# Patient Record
Sex: Female | Born: 1945 | Race: Black or African American | Hispanic: No | Marital: Single | Smoking: Former smoker
Health system: Southern US, Community
[De-identification: ages and names within clinical notes are randomized; demographics above are authoritative.]

## PROBLEM LIST (undated history)

## (undated) ENCOUNTER — Emergency Department (HOSPITAL_COMMUNITY): Payer: Medicare Other

## (undated) DIAGNOSIS — F32A Depression, unspecified: Secondary | ICD-10-CM

## (undated) DIAGNOSIS — E119 Type 2 diabetes mellitus without complications: Secondary | ICD-10-CM

## (undated) DIAGNOSIS — I10 Essential (primary) hypertension: Secondary | ICD-10-CM

## (undated) DIAGNOSIS — F329 Major depressive disorder, single episode, unspecified: Secondary | ICD-10-CM

## (undated) DIAGNOSIS — E785 Hyperlipidemia, unspecified: Secondary | ICD-10-CM

## (undated) HISTORY — DX: Type 2 diabetes mellitus without complications: E11.9

## (undated) HISTORY — DX: Essential (primary) hypertension: I10

## (undated) HISTORY — PX: UTERINE FIBROID SURGERY: SHX826

## (undated) HISTORY — DX: Major depressive disorder, single episode, unspecified: F32.9

## (undated) HISTORY — DX: Depression, unspecified: F32.A

## (undated) HISTORY — DX: Hyperlipidemia, unspecified: E78.5

---

## 1998-07-01 ENCOUNTER — Encounter: Admission: RE | Admit: 1998-07-01 | Discharge: 1998-09-29 | Payer: Self-pay | Admitting: Unknown Physician Specialty

## 2002-09-10 ENCOUNTER — Encounter: Admission: RE | Admit: 2002-09-10 | Discharge: 2002-12-09 | Payer: Self-pay | Admitting: Family Medicine

## 2008-04-15 ENCOUNTER — Encounter: Admission: RE | Admit: 2008-04-15 | Discharge: 2008-04-15 | Payer: Self-pay | Admitting: Internal Medicine

## 2010-05-05 ENCOUNTER — Encounter: Admission: RE | Admit: 2010-05-05 | Discharge: 2010-05-05 | Payer: Self-pay | Admitting: Family Medicine

## 2011-05-24 ENCOUNTER — Other Ambulatory Visit: Payer: Self-pay | Admitting: Family Medicine

## 2011-05-24 ENCOUNTER — Ambulatory Visit
Admission: RE | Admit: 2011-05-24 | Discharge: 2011-05-24 | Disposition: A | Discharge status: Home / Self Care | Payer: Medicare Other | Source: Ambulatory Visit | Attending: Family Medicine | Admitting: Family Medicine

## 2011-05-24 DIAGNOSIS — Z1239 Encounter for other screening for malignant neoplasm of breast: Secondary | ICD-10-CM

## 2011-07-30 ENCOUNTER — Encounter (INDEPENDENT_AMBULATORY_CARE_PROVIDER_SITE_OTHER): Payer: Medicare Other | Admitting: Ophthalmology

## 2011-08-02 ENCOUNTER — Encounter (INDEPENDENT_AMBULATORY_CARE_PROVIDER_SITE_OTHER): Payer: Medicare HMO | Admitting: Ophthalmology

## 2011-08-02 DIAGNOSIS — E11319 Type 2 diabetes mellitus with unspecified diabetic retinopathy without macular edema: Secondary | ICD-10-CM

## 2011-08-02 DIAGNOSIS — H35039 Hypertensive retinopathy, unspecified eye: Secondary | ICD-10-CM

## 2011-08-02 DIAGNOSIS — H2510 Age-related nuclear cataract, unspecified eye: Secondary | ICD-10-CM

## 2011-08-02 DIAGNOSIS — E1139 Type 2 diabetes mellitus with other diabetic ophthalmic complication: Secondary | ICD-10-CM

## 2011-08-02 DIAGNOSIS — H43819 Vitreous degeneration, unspecified eye: Secondary | ICD-10-CM

## 2011-08-02 DIAGNOSIS — I10 Essential (primary) hypertension: Secondary | ICD-10-CM

## 2012-05-01 ENCOUNTER — Ambulatory Visit (INDEPENDENT_AMBULATORY_CARE_PROVIDER_SITE_OTHER): Payer: Medicare HMO | Admitting: Ophthalmology

## 2012-05-12 ENCOUNTER — Other Ambulatory Visit: Payer: Self-pay | Admitting: Family Medicine

## 2012-05-12 DIAGNOSIS — Z1231 Encounter for screening mammogram for malignant neoplasm of breast: Secondary | ICD-10-CM

## 2012-07-05 ENCOUNTER — Ambulatory Visit: Payer: Medicare HMO

## 2012-10-24 ENCOUNTER — Ambulatory Visit
Admission: RE | Admit: 2012-10-24 | Discharge: 2012-10-24 | Disposition: A | Discharge status: Home / Self Care | Payer: Medicare HMO | Source: Ambulatory Visit | Attending: Family Medicine | Admitting: Family Medicine

## 2012-10-24 DIAGNOSIS — Z1231 Encounter for screening mammogram for malignant neoplasm of breast: Secondary | ICD-10-CM

## 2013-01-30 ENCOUNTER — Other Ambulatory Visit: Payer: Self-pay | Admitting: Family Medicine

## 2013-01-30 ENCOUNTER — Ambulatory Visit
Admission: RE | Admit: 2013-01-30 | Discharge: 2013-01-30 | Disposition: A | Discharge status: Home / Self Care | Payer: Medicare HMO | Source: Ambulatory Visit | Attending: Family Medicine | Admitting: Family Medicine

## 2013-01-30 DIAGNOSIS — M542 Cervicalgia: Secondary | ICD-10-CM

## 2013-02-07 ENCOUNTER — Ambulatory Visit: Payer: Medicare HMO | Attending: Family Medicine

## 2013-02-07 DIAGNOSIS — IMO0001 Reserved for inherently not codable concepts without codable children: Secondary | ICD-10-CM | POA: Insufficient documentation

## 2013-02-07 DIAGNOSIS — R293 Abnormal posture: Secondary | ICD-10-CM | POA: Insufficient documentation

## 2013-02-07 DIAGNOSIS — M542 Cervicalgia: Secondary | ICD-10-CM | POA: Insufficient documentation

## 2013-02-13 ENCOUNTER — Ambulatory Visit: Payer: Medicare HMO | Admitting: Physical Therapy

## 2013-03-02 ENCOUNTER — Encounter: Payer: Medicare HMO | Admitting: Physical Therapy

## 2014-03-18 ENCOUNTER — Encounter: Payer: Medicare Other | Attending: Endocrinology | Admitting: *Deleted

## 2014-03-18 ENCOUNTER — Encounter: Payer: Self-pay | Admitting: *Deleted

## 2014-03-18 VITALS — Ht 67.0 in | Wt 221.2 lb

## 2014-03-18 DIAGNOSIS — Z713 Dietary counseling and surveillance: Secondary | ICD-10-CM | POA: Insufficient documentation

## 2014-03-18 DIAGNOSIS — E119 Type 2 diabetes mellitus without complications: Secondary | ICD-10-CM | POA: Diagnosis present

## 2014-03-18 NOTE — Progress Notes (Signed)
Diabetes Self-Management Education  Visit Type:    Appt. Start Time: 1400 End Time: 1530   03/18/2014  [name], identified by name and date of birth, is a 68 y.o. female with a diagnosis of Diabetes: Type 2.  Other people present during visit:  Patient   ASSESSMENT  Height  (1.702 m), weight 221 lb 3.2 oz (100.336 kg). Body mass index is 34.64 kg/(m^2).  Initial Visit Information:  Are you currently following a meal plan?: No   Are you taking your medications as prescribed?: Yes Are you checking your feet?: Yes How many days per week are you checking your feet?: 7 How often do you need to have someone help you when you read instructions, pamphlets, or other written materials from your doctor or pharmacy?: 1 - Never    Psychosocial:     Patient Belief/Attitude about Diabetes: Motivated to manage diabetes Self-care barriers: None Self-management support: Support group Other persons present: Patient Patient Concerns: Nutrition/Meal planning Special Needs: None Preferred Learning Style: Hands on Learning Readiness: Change in progress  Complications:   Last HgB A1C per patient/outside source: 7.7 mg/dL How often do you check your blood sugar?:  (Patient has not had instruction on her meter yet, and did not bring it today.) Number of hypoglycemic episodes per month: 0 Have you had a dilated eye exam in the past 12 months?: Yes Have you had a dental exam in the past 12 months?: Yes  Diet Intake:  Breakfast: skips most days Snack (morning): none Lunch: breakfast foods such as cereal with 2% milk, and biscuits OR eggs, bacon, biscuits with margarine, coffee with creamora Snack (afternoon): none Dinner: meat, salad, soup, biscuits and extra desserts as available, unsweetened tea Snack (evening): none Beverage(s): coffee, unsweetened tea, water  Exercise:  Exercise: Light (walking / raking leaves) Light Exercise amount of time (min / week):  150  Individualized Plan for Diabetes Self-Management Training:   Learning Objective:  Patient will have a greater understanding of diabetes self-management.  Patient education plan per assessed needs and concerns is to attend individual sessions for     Education Topics Reviewed with Patient Today:  Definition of diabetes, type 1 and 2, and the diagnosis of diabetes Role of diet in the treatment of diabetes and the relationship between the three main macronutrients and blood glucose level;Food label reading, portion sizes and measuring food.;Carbohydrate counting;Information on hints to eating out and maintain blood glucose control. Role of exercise on diabetes management, blood pressure control and cardiac health. Reviewed patients medication for diabetes, action, purpose, timing of dose and side effects. Identified appropriate SMBG and/or A1C goals.;Daily foot exams;Yearly dilated eye exam;Purpose and frequency of SMBG.;Other (comment) (Patient did not bring her meter for instruction. I demonstrated different types while she was here.)     Role of stress on diabetes;Other (comment) (Provided flyer for Support Group)      PATIENTS GOALS/Plan (Developed by the patient):  Nutrition: Follow meal plan discussed Physical Activity: Exercise 3-5 times per week Medications: take my medication as prescribed Monitoring : test blood glucose pre and post meals as discussed Reducing Risk: get labs drawn;do foot checks daily  Plan:   Patient Instructions  Plan:  Aim for 3 Carb Choices per meal (45 grams) +/- 1 either way  Aim for 0-2 Carbs per snack if hungry  Include protein in moderation with your meals and snacks Consider reading food labels for Total Carbohydrate of foods Consider  increasing your activity level by going  to gym again 3 days a week as tolerated Consider checking BG at alternate times per day as directed by MD  Continue taking medication as directed by MD Ask MD for Rx  for Accu Chek Fast Clix Drum lancing device and Drums       Expected Outcomes:  Demonstrated interest in learning. Expect positive outcomes  Education material provided: Living Well with Diabetes, Food label handouts, Meal plan card and Support group flyer  If problems or questions, patient to contact team via:  Phone  Future DSME appointment: 4-6 wks

## 2014-03-18 NOTE — Patient Instructions (Signed)
Plan:  Aim for 3 Carb Choices per meal (45 grams) +/- 1 either way  Aim for 0-2 Carbs per snack if hungry  Include protein in moderation with your meals and snacks Consider reading food labels for Total Carbohydrate of foods Consider  increasing your activity level by going to gym again 3 days a week as tolerated Consider checking BG at alternate times per day as directed by MD  Continue taking medication as directed by MD Ask MD for Rx for Accu Chek Fast Clix Drum lancing device and Drums

## 2014-04-29 ENCOUNTER — Ambulatory Visit: Payer: Medicare HMO | Admitting: *Deleted

## 2014-06-04 ENCOUNTER — Other Ambulatory Visit: Payer: Self-pay

## 2014-06-04 ENCOUNTER — Ambulatory Visit
Admission: RE | Admit: 2014-06-04 | Discharge: 2014-06-04 | Disposition: A | Discharge status: Home / Self Care | Payer: Medicare Other | Source: Ambulatory Visit

## 2014-06-04 DIAGNOSIS — Z1231 Encounter for screening mammogram for malignant neoplasm of breast: Secondary | ICD-10-CM

## 2014-06-06 ENCOUNTER — Other Ambulatory Visit: Payer: Self-pay | Admitting: Family Medicine

## 2014-06-06 DIAGNOSIS — R928 Other abnormal and inconclusive findings on diagnostic imaging of breast: Secondary | ICD-10-CM

## 2014-06-17 ENCOUNTER — Ambulatory Visit
Admission: RE | Admit: 2014-06-17 | Discharge: 2014-06-17 | Disposition: A | Discharge status: Home / Self Care | Payer: Medicare Other | Source: Ambulatory Visit | Attending: Family Medicine | Admitting: Family Medicine

## 2014-06-17 DIAGNOSIS — R928 Other abnormal and inconclusive findings on diagnostic imaging of breast: Secondary | ICD-10-CM

## 2014-09-30 ENCOUNTER — Other Ambulatory Visit (HOSPITAL_COMMUNITY)
Admission: RE | Admit: 2014-09-30 | Discharge: 2014-09-30 | Disposition: A | Discharge status: Home / Self Care | Payer: PPO | Source: Ambulatory Visit | Attending: Family Medicine | Admitting: Family Medicine

## 2014-09-30 ENCOUNTER — Other Ambulatory Visit: Payer: Self-pay | Admitting: Family Medicine

## 2014-09-30 DIAGNOSIS — Z1151 Encounter for screening for human papillomavirus (HPV): Secondary | ICD-10-CM | POA: Insufficient documentation

## 2014-09-30 DIAGNOSIS — Z124 Encounter for screening for malignant neoplasm of cervix: Secondary | ICD-10-CM | POA: Insufficient documentation

## 2014-10-09 LAB — CYTOLOGY - PAP

## 2015-11-17 DIAGNOSIS — M542 Cervicalgia: Secondary | ICD-10-CM | POA: Diagnosis not present

## 2015-11-17 DIAGNOSIS — M545 Low back pain: Secondary | ICD-10-CM | POA: Diagnosis not present

## 2015-12-20 DIAGNOSIS — M542 Cervicalgia: Secondary | ICD-10-CM | POA: Diagnosis not present

## 2015-12-20 DIAGNOSIS — M545 Low back pain: Secondary | ICD-10-CM | POA: Diagnosis not present

## 2015-12-26 DIAGNOSIS — E7800 Pure hypercholesterolemia, unspecified: Secondary | ICD-10-CM | POA: Diagnosis not present

## 2015-12-26 DIAGNOSIS — E1165 Type 2 diabetes mellitus with hyperglycemia: Secondary | ICD-10-CM | POA: Diagnosis not present

## 2015-12-26 DIAGNOSIS — F409 Phobic anxiety disorder, unspecified: Secondary | ICD-10-CM | POA: Diagnosis not present

## 2015-12-26 DIAGNOSIS — Z79899 Other long term (current) drug therapy: Secondary | ICD-10-CM | POA: Diagnosis not present

## 2015-12-26 DIAGNOSIS — I10 Essential (primary) hypertension: Secondary | ICD-10-CM | POA: Diagnosis not present

## 2015-12-26 DIAGNOSIS — G4700 Insomnia, unspecified: Secondary | ICD-10-CM | POA: Diagnosis not present

## 2015-12-26 DIAGNOSIS — Z0001 Encounter for general adult medical examination with abnormal findings: Secondary | ICD-10-CM | POA: Diagnosis not present

## 2015-12-29 DIAGNOSIS — M542 Cervicalgia: Secondary | ICD-10-CM | POA: Diagnosis not present

## 2015-12-29 DIAGNOSIS — M545 Low back pain: Secondary | ICD-10-CM | POA: Diagnosis not present

## 2016-02-13 ENCOUNTER — Ambulatory Visit (INDEPENDENT_AMBULATORY_CARE_PROVIDER_SITE_OTHER): Payer: PPO | Admitting: Internal Medicine

## 2016-02-13 ENCOUNTER — Encounter: Payer: Self-pay | Admitting: Internal Medicine

## 2016-02-13 VITALS — BP 140/82 | HR 79 | Ht 68.5 in | Wt 215.0 lb

## 2016-02-13 DIAGNOSIS — E1165 Type 2 diabetes mellitus with hyperglycemia: Secondary | ICD-10-CM

## 2016-02-13 DIAGNOSIS — Z794 Long term (current) use of insulin: Secondary | ICD-10-CM | POA: Diagnosis not present

## 2016-02-13 DIAGNOSIS — IMO0001 Reserved for inherently not codable concepts without codable children: Secondary | ICD-10-CM

## 2016-02-13 NOTE — Patient Instructions (Signed)
Please continue: - Metformin 1000 mg 1x a day, with your meal - Amaryl 2 mg daily before brunch - Lantus 32 units at bedtime  Start checking sugars 1-2x a day, rotating checktimes.  Please return in 1.5 months with your sugar log.   PATIENT INSTRUCTIONS FOR TYPE 2 DIABETES:  **Please join MyChart!** - see attached instructions about how to join if you have not done so already.  DIET AND EXERCISE Diet and exercise is an important part of diabetic treatment.  We recommended aerobic exercise in the form of brisk walking (working between 40-60% of maximal aerobic capacity, similar to brisk walking) for 150 minutes per week (such as 30 minutes five days per week) along with 3 times per week performing 'resistance' training (using various gauge rubber tubes with handles) 5-10 exercises involving the major muscle groups (upper body, lower body and core) performing 10-15 repetitions (or near fatigue) each exercise. Start at half the above goal but build slowly to reach the above goals. If limited by weight, joint pain, or disability, we recommend daily walking in a swimming pool with water up to waist to reduce pressure from joints while allow for adequate exercise.    BLOOD GLUCOSES Monitoring your blood glucoses is important for continued management of your diabetes. Please check your blood glucoses 2-4 times a day: fasting, before meals and at bedtime (you can rotate these measurements - e.g. one day check before the 3 meals, the next day check before 2 of the meals and before bedtime, etc.).   HYPOGLYCEMIA (low blood sugar) Hypoglycemia is usually a reaction to not eating, exercising, or taking too much insulin/ other diabetes drugs.  Symptoms include tremors, sweating, hunger, confusion, headache, etc. Treat IMMEDIATELY with 15 grams of Carbs: . 4 glucose tablets .  cup regular juice/soda . 2 tablespoons raisins . 4 teaspoons sugar . 1 tablespoon honey Recheck blood glucose in 15 mins and  repeat above if still symptomatic/blood glucose <100.  RECOMMENDATIONS TO REDUCE YOUR RISK OF DIABETIC COMPLICATIONS: * Take your prescribed MEDICATION(S) * Follow a DIABETIC diet: Complex carbs, fiber rich foods, (monounsaturated and polyunsaturated) fats * AVOID saturated/trans fats, high fat foods, >2,300 mg salt per day. * EXERCISE at least 5 times a week for 30 minutes or preferably daily.  * DO NOT SMOKE OR DRINK more than 1 drink a day. * Check your FEET every day. Do not wear tightfitting shoes. Contact us if you develop an ulcer * See your EYE doctor once a year or more if needed * Get a FLU shot once a year * Get a PNEUMONIA vaccine once before and once after age 70 years  GOALS:  * Your Hemoglobin A1c of <7%  * fasting sugars need to be <130 * after meals sugars need to be <180 (2h after you start eating) * Your Systolic BP should be 140 or lower  * Your Diastolic BP should be 80 or lower  * Your HDL (Good Cholesterol) should be 40 or higher  * Your LDL (Bad Cholesterol) should be 100 or lower. * Your Triglycerides should be 150 or lower  * Your Urine microalbumin (kidney function) should be <30 * Your Body Mass Index should be 25 or lower    Please consider the following ways to cut down carbs and fat and increase fiber and micronutrients in your diet: - substitute whole grain for white bread or pasta - substitute brown rice for white rice - substitute 90-calorie flat bread pieces for slices of  bread when possible - substitute sweet potatoes or yams for white potatoes - substitute humus for margarine - substitute tofu for cheese when possible - substitute almond or rice milk for regular milk (would not drink soy milk daily due to concern for soy estrogen influence on breast cancer risk) - substitute dark chocolate for other sweets when possible - substitute water - can add lemon or orange slices for taste - for diet sodas (artificial sweeteners will trick your body that  you can eat sweets without getting calories and will lead you to overeating and weight gain in the long run) - do not skip breakfast or other meals (this will slow down the metabolism and will result in more weight gain over time)  - can try smoothies made from fruit and almond/rice milk in am instead of regular breakfast - can also try old-fashioned (not instant) oatmeal made with almond/rice milk in am - order the dressing on the side when eating salad at a restaurant (pour less than half of the dressing on the salad) - eat as little meat as possible - can try juicing, but should not forget that juicing will get rid of the fiber, so would alternate with eating raw veg./fruits or drinking smoothies - use as little oil as possible, even when using olive oil - can dress a salad with a mix of balsamic vinegar and lemon juice, for e.g. - use agave nectar, stevia sugar, or regular sugar rather than artificial sweateners - steam or broil/roast veggies  - snack on veggies/fruit/nuts (unsalted, preferably) when possible, rather than processed foods - reduce or eliminate aspartame in diet (it is in diet sodas, chewing gum, etc) Read the labels!  Try to read Dr. Janene Harvey book: "Program for Reversing Diabetes" for other ideas for healthy eating.

## 2016-02-13 NOTE — Progress Notes (Signed)
Patient ID: Cynthia Hunt, female   DOB: Apr 12, 1946, 70 y.o.   MRN: [id number]   HPI: Cynthia Hunt is a 70 y.o.-year-old female, referred by her PCP, Dr. Lujean Amel for management of DM2, dx in 32s, insulin-dependent since 2015, uncontrolled, without complications. She was previously seen by Dr Chalmers Cater.  Last hemoglobin A1c was: 12/28/2015: HbA1c 10%  Pt is on a regimen of: - Metformin 1000 mg 1x a day, with her only meal of the day: 2-3 pm - Amaryl 2 mg daily before brunch - Lantus 32 units at bedtime  Pt was checking her sugars 1x a day - not since last year!  (fingers hurt) - am: n/c - 2h after b'fast: n/c - before lunch: n/c - 2h after lunch: n/c - before dinner: n/c - 2h after dinner: n/c - bedtime: 78-80 if no carbohydrates with meals (feels dizzy); 140-150 if eats carbs - nighttime: n/c Lowest sugar was 60s; she has hypoglycemia awareness at 70.  Highest sugar was ?.  Glucometer: AccuChek >> broken  Pt's meals are: - Breakfast: n/c - Lunch: soup, fish, cabage, salad, icecream 5x a week  - Dinner: n/c - Snacks: 0-1 a day - pork rinds, popcorn  - no CKD, last BUN/creatinine:  12/28/2015: 24/1.03, eGFR 53/64 No results found for: BUN, CREATININE - last set of lipids: 12/28/2015: 166/102/51/95 No results found for: CHOL, HDL, LDLCALC, LDLDIRECT, TRIG, CHOLHDL - last eye exam was in 2015. No DR. New exam coming up in 01/2016) - + numbness and tingling in her R foot.  Pt has FH of DM in PGM.  She also has HTN, HL.  ROS: Constitutional: + weight gain, no fatigue, no subjective hyperthermia/hypothermia, + poor sleep, + nocturia Eyes: no blurry vision, no xerophthalmia ENT: no sore throat, no nodules palpated in throat, no dysphagia/odynophagia, no hoarseness, + tinnistus Cardiovascular: no CP/SOB/palpitations/leg swelling Respiratory: no cough/SOB Gastrointestinal: no N/V/D/+ C Musculoskeletal: no muscle/+ joint aches Skin: no rashes Neurological: no  tremors/numbness/tingling/dizziness Psychiatric: + depression/+ anxiety + low libido  Past Medical History:  Diagnosis Date  . Depression   . Diabetes mellitus without complication ([place])   . Hyperlipidemia   . Hypertension    No past surgical history on file. Social History   Social History  . Marital status: Single    Spouse name: N/A  . Number of children: 0   Occupational History  . retired   Social History Main Topics  . Smoking status: Former Smoker    Quit date: 2005  . Smokeless tobacco: Never Used  . Alcohol use Yes     Vodka, rum 3x a year  . Drug use: Unknown    Dose, Frequency               _0   traZODone (DESYREL) 100 MG tablet            Note (02/13/2016): Received from: External Pharmacy        _1   simvastatin (ZOCOR) 40 MG tablet            Note (02/13/2016): Received from: External Pharmacy        _2   methocarbamol (ROBAXIN) 500 MG tablet            Note (02/13/2016): Received from: External Pharmacy        _3   metFORMIN (GLUCOPHAGE) 1000 MG tablet            Note (02/13/2016): Received from: External Pharmacy        _4   losartan (COZAAR) 50 MG tablet            Note (02/13/2016): Received from: External Pharmacy        _0   LANTUS SOLOSTAR 100 UNIT/ML Solostar Pen       Summary: USE 35 UNITS ONCE DAILY AT BEDTIME          Note (02/13/2016): Received from: External Pharmacy        _1   Lancets (FREESTYLE) lancets            Note (02/13/2016): Received from: External Pharmacy        _2   hydrochlorothiazide (HYDRODIURIL) 25 MG tablet            Note (02/13/2016): Received from: External Pharmacy        _3   glimepiride (AMARYL) 2 MG tablet       Summary: TK T PO WITH BREAKFAST OR THE FIRST MAIN MEAL QD          Note (02/13/2016): Received from: External Pharmacy        _4   FREESTYLE LITE test strip            Note (02/13/2016): Received from: External Pharmacy        _5   cetirizine (ZYRTEC) 10 MG tablet       Summary: TK  1 T PO QD          Note (02/13/2016): Received from: External Pharmacy        _6   Blood Glucose Calibration (FREESTYLE CONTROL SOLUTION) LIQD            Note (02/13/2016): Received from: External Pharmacy        _7   ALPRAZolam Duanne Moron) 0.5 MG tablet            Note (02/13/2016): Received from: External Pharmacy        _8   Alcohol Swabs (ALCOHOL PREP) 70 % PADS            Note (02/13/2016): Received from: External Pharmacy          Allergies  Allergen Reactions  . Penicillins    No family history on file.  PE: BP 140/82 (BP Location: Left Arm, Patient Position: Sitting)   Pulse 79   Ht 5' 8.5" (1.74 m)   Wt 215 lb (97.5 kg)   SpO2 95%   BMI 32.22 kg/m  Wt Readings from Last 3 Encounters:  02/13/16 215 lb (97.5 kg)  03/18/14 221 lb 3.2 oz (100.3 kg)   Constitutional: overweight, in NAD Eyes: PERRLA, EOMI, no exophthalmos ENT: moist mucous membranes, no thyromegaly, no cervical lymphadenopathy Cardiovascular: RRR, No MRG Respiratory: CTA B Gastrointestinal: abdomen soft, NT, ND, BS+ Musculoskeletal: no deformities, strength intact in all 4 Skin: moist, warm, no rashes Neurological: no tremor with outstretched hands, DTR normal in all 4  ASSESSMENT: 1. DM2, insulin-dependent, uncontrolled, without complications - ? PN  PLAN:  1. Patient with long-standing, uncontrolled diabetes, on oral antidiabetic regimen + basal insulin, which became insufficient.Unfortunately, she is not checking her sugars, so I cannot adjust her regimen today. She mentions that her fingers hurt when she checks her sugar. We gave her a new FreeStyle Lie meter with alternative site capabilities. We discussed about checking sugars 1 to twice a day, rotating check times. For now, continue the same doses of her medicines but I will see her back in 1.5 months with your sugar log for further adjustment. - I suggested to:  Patient Instructions  Please continue: - Metformin 1000 mg  1x a day, with  your meal - Amaryl 2 mg daily before brunch - Lantus 32 units at bedtime  Start checking sugars 1-2x a day, rotating checktimes.  Please return in 1.5 months with your sugar log.   - Strongly advised her to start checking sugars at different times of the day - check 1-2 times a day, rotating checks - given sugar log and advised how to fill it and to bring it at next appt  - given foot care handout and explained the principles  - given instructions for hypoglycemia management "15-15 rule"  - advised for yearly eye exams  - Return to clinic in 1.5 mo with sugar log   Philemon Kingdom, MD PhD [facility]

## 2016-02-26 DIAGNOSIS — E119 Type 2 diabetes mellitus without complications: Secondary | ICD-10-CM | POA: Diagnosis not present

## 2016-02-26 DIAGNOSIS — H25813 Combined forms of age-related cataract, bilateral: Secondary | ICD-10-CM | POA: Diagnosis not present

## 2016-04-15 ENCOUNTER — Ambulatory Visit: Payer: PPO | Admitting: Internal Medicine

## 2016-07-21 DIAGNOSIS — Z794 Long term (current) use of insulin: Secondary | ICD-10-CM | POA: Diagnosis not present

## 2016-07-21 DIAGNOSIS — E1165 Type 2 diabetes mellitus with hyperglycemia: Secondary | ICD-10-CM | POA: Diagnosis not present

## 2016-07-21 DIAGNOSIS — I10 Essential (primary) hypertension: Secondary | ICD-10-CM | POA: Diagnosis not present

## 2016-07-21 DIAGNOSIS — E7800 Pure hypercholesterolemia, unspecified: Secondary | ICD-10-CM | POA: Diagnosis not present

## 2016-07-21 DIAGNOSIS — Z79899 Other long term (current) drug therapy: Secondary | ICD-10-CM | POA: Diagnosis not present

## 2016-11-12 DIAGNOSIS — E119 Type 2 diabetes mellitus without complications: Secondary | ICD-10-CM | POA: Diagnosis not present

## 2016-12-21 DIAGNOSIS — Z1159 Encounter for screening for other viral diseases: Secondary | ICD-10-CM | POA: Diagnosis not present

## 2016-12-21 DIAGNOSIS — G4700 Insomnia, unspecified: Secondary | ICD-10-CM | POA: Diagnosis not present

## 2016-12-21 DIAGNOSIS — Z794 Long term (current) use of insulin: Secondary | ICD-10-CM | POA: Diagnosis not present

## 2016-12-21 DIAGNOSIS — Z79899 Other long term (current) drug therapy: Secondary | ICD-10-CM | POA: Diagnosis not present

## 2016-12-21 DIAGNOSIS — I10 Essential (primary) hypertension: Secondary | ICD-10-CM | POA: Diagnosis not present

## 2016-12-21 DIAGNOSIS — E1165 Type 2 diabetes mellitus with hyperglycemia: Secondary | ICD-10-CM | POA: Diagnosis not present

## 2016-12-21 DIAGNOSIS — E7800 Pure hypercholesterolemia, unspecified: Secondary | ICD-10-CM | POA: Diagnosis not present

## 2017-02-12 DIAGNOSIS — E119 Type 2 diabetes mellitus without complications: Secondary | ICD-10-CM | POA: Diagnosis not present

## 2017-06-24 DIAGNOSIS — Z79899 Other long term (current) drug therapy: Secondary | ICD-10-CM | POA: Diagnosis not present

## 2017-06-24 DIAGNOSIS — Z7984 Long term (current) use of oral hypoglycemic drugs: Secondary | ICD-10-CM | POA: Diagnosis not present

## 2017-06-24 DIAGNOSIS — E7800 Pure hypercholesterolemia, unspecified: Secondary | ICD-10-CM | POA: Diagnosis not present

## 2017-06-24 DIAGNOSIS — Z0001 Encounter for general adult medical examination with abnormal findings: Secondary | ICD-10-CM | POA: Diagnosis not present

## 2017-06-24 DIAGNOSIS — G4700 Insomnia, unspecified: Secondary | ICD-10-CM | POA: Diagnosis not present

## 2017-06-24 DIAGNOSIS — F409 Phobic anxiety disorder, unspecified: Secondary | ICD-10-CM | POA: Diagnosis not present

## 2017-06-24 DIAGNOSIS — I10 Essential (primary) hypertension: Secondary | ICD-10-CM | POA: Diagnosis not present

## 2017-06-24 DIAGNOSIS — E1165 Type 2 diabetes mellitus with hyperglycemia: Secondary | ICD-10-CM | POA: Diagnosis not present

## 2018-01-04 ENCOUNTER — Other Ambulatory Visit: Payer: Self-pay

## 2018-01-04 NOTE — Patient Outreach (Signed)
[facility]) Care Management  01/04/2018  Orlean BradfordSheila M Rocchi [birth year] [id number]    BS[facility], Livia SnellenGeronda Pulliam, regarding transportation for patient to eye doctor.  Brooke BonitoGeronda was contacted by Enos FlingHalona Mitchell at [facility] regarding this. BSW attempted to contact patient to arrange transportation but was unable to reach her of leave a voicemail message.  BSW will make another attempt tomorrow.   Malachy ChamberAmber Marq Rebello, BSW Social Worker [phone]

## 2018-01-05 ENCOUNTER — Other Ambulatory Visit: Payer: Self-pay

## 2018-01-05 NOTE — Patient Outreach (Signed)
[facility]) Care Management  01/05/2018  Orlean BradfordSheila M Birr [birth year] [id number]  Talked with Ms. Alona BeneMaingot about transportation request to eye dr.  In[facility] if she calls back with appointment details.  Ms. Alona BeneMaingot agreed to call BSW and provide details once scheduled.    Malachy ChamberAmber Laelle Bridgett, BSW Social Worker [phone]

## 2018-01-11 ENCOUNTER — Other Ambulatory Visit: Payer: Self-pay

## 2018-01-11 NOTE — Patient Outreach (Signed)
[facility]) Care Management  01/11/2018  Cynthia BradfordSheila M Cocker 1946/01/04 [id number]  BSW received return call from patient with details for appointment with Dr. Dione BoozeGroat.  Transportation was arranged via [employer] for 01/17/18 @ 1:45 PM.  Malachy ChamberAmber Lorry Furber, BSW Social Worker [phone]

## 2018-09-05 ENCOUNTER — Other Ambulatory Visit: Payer: Self-pay | Admitting: *Deleted

## 2018-09-05 NOTE — Patient Outreach (Signed)
  [facility]) Care Management Chronic Special Needs Program  09/05/2018  Name: Cynthia Hunt DOB: Apr 22, 1946  MRN: [id number]  [name] is enrolled in a chronic special needs plan for  Diabetes. A completed health risk assessment has not been received from the client and client has not responded to outreach attempts by their health care concierge.  The client's individualized care plan was developed based on available data.  Plan:  . Send unsuccessful outreach letter with a copy of individualized care plan to client . Send educational material related to HTN . Send individualized care plan to provider  Chronic care management coordinator, Cynthia Hunt, will attempt outreach in 2-4 months.   Cynthia Richard RN,CCM,CDE [facility] Care Management Coordinator Office Phone [phone] Office Fax [phone]

## 2018-09-06 DIAGNOSIS — E11319 Type 2 diabetes mellitus with unspecified diabetic retinopathy without macular edema: Secondary | ICD-10-CM | POA: Diagnosis not present

## 2018-09-06 DIAGNOSIS — D649 Anemia, unspecified: Secondary | ICD-10-CM | POA: Diagnosis not present

## 2018-09-06 DIAGNOSIS — Z0001 Encounter for general adult medical examination with abnormal findings: Secondary | ICD-10-CM | POA: Diagnosis not present

## 2018-09-06 DIAGNOSIS — I10 Essential (primary) hypertension: Secondary | ICD-10-CM | POA: Diagnosis not present

## 2018-09-06 DIAGNOSIS — E1165 Type 2 diabetes mellitus with hyperglycemia: Secondary | ICD-10-CM | POA: Diagnosis not present

## 2018-09-06 DIAGNOSIS — Z79899 Other long term (current) drug therapy: Secondary | ICD-10-CM | POA: Diagnosis not present

## 2018-09-06 DIAGNOSIS — F409 Phobic anxiety disorder, unspecified: Secondary | ICD-10-CM | POA: Diagnosis not present

## 2018-09-06 DIAGNOSIS — Z794 Long term (current) use of insulin: Secondary | ICD-10-CM | POA: Diagnosis not present

## 2018-09-06 DIAGNOSIS — R42 Dizziness and giddiness: Secondary | ICD-10-CM | POA: Diagnosis not present

## 2018-09-19 ENCOUNTER — Emergency Department (HOSPITAL_COMMUNITY)
Admission: EM | Admit: 2018-09-19 | Discharge: 2018-09-20 | Disposition: A | Discharge status: Home / Self Care | Payer: HMO | Attending: Emergency Medicine | Admitting: Emergency Medicine

## 2018-09-19 ENCOUNTER — Encounter (HOSPITAL_COMMUNITY): Payer: Self-pay | Admitting: *Deleted

## 2018-09-19 ENCOUNTER — Other Ambulatory Visit: Payer: Self-pay

## 2018-09-19 DIAGNOSIS — R55 Syncope and collapse: Secondary | ICD-10-CM | POA: Diagnosis not present

## 2018-09-19 DIAGNOSIS — I959 Hypotension, unspecified: Secondary | ICD-10-CM | POA: Insufficient documentation

## 2018-09-19 DIAGNOSIS — N390 Urinary tract infection, site not specified: Secondary | ICD-10-CM | POA: Diagnosis not present

## 2018-09-19 DIAGNOSIS — E119 Type 2 diabetes mellitus without complications: Secondary | ICD-10-CM | POA: Diagnosis not present

## 2018-09-19 DIAGNOSIS — R0902 Hypoxemia: Secondary | ICD-10-CM | POA: Diagnosis not present

## 2018-09-19 DIAGNOSIS — R42 Dizziness and giddiness: Secondary | ICD-10-CM | POA: Diagnosis not present

## 2018-09-19 DIAGNOSIS — Z79899 Other long term (current) drug therapy: Secondary | ICD-10-CM | POA: Insufficient documentation

## 2018-09-19 DIAGNOSIS — Z87891 Personal history of nicotine dependence: Secondary | ICD-10-CM | POA: Diagnosis not present

## 2018-09-19 DIAGNOSIS — I10 Essential (primary) hypertension: Secondary | ICD-10-CM | POA: Insufficient documentation

## 2018-09-19 DIAGNOSIS — I499 Cardiac arrhythmia, unspecified: Secondary | ICD-10-CM | POA: Diagnosis not present

## 2018-09-19 DIAGNOSIS — I951 Orthostatic hypotension: Secondary | ICD-10-CM

## 2018-09-19 DIAGNOSIS — E1165 Type 2 diabetes mellitus with hyperglycemia: Secondary | ICD-10-CM | POA: Diagnosis not present

## 2018-09-19 LAB — CBC WITH DIFFERENTIAL/PLATELET
Abs Immature Granulocytes: 0.01 10*3/uL (ref 0.00–0.07)
Basophils Absolute: 0 10*3/uL (ref 0.0–0.1)
Basophils Relative: 0 %
Eosinophils Absolute: 0 10*3/uL (ref 0.0–0.5)
Eosinophils Relative: 0 %
HCT: 44.2 % (ref 36.0–46.0)
Hemoglobin: 11.9 g/dL — ABNORMAL LOW (ref 12.0–15.0)
Immature Granulocytes: 0 %
Lymphocytes Relative: 16 %
Lymphs Abs: 1 10*3/uL (ref 0.7–4.0)
MCH: 27.7 pg (ref 26.0–34.0)
MCHC: 26.9 g/dL — ABNORMAL LOW (ref 30.0–36.0)
MCV: 103 fL — ABNORMAL HIGH (ref 80.0–100.0)
Monocytes Absolute: 0.3 10*3/uL (ref 0.1–1.0)
Monocytes Relative: 4 %
Neutro Abs: 4.8 10*3/uL (ref 1.7–7.7)
Neutrophils Relative %: 80 %
Platelets: 186 10*3/uL (ref 150–400)
RBC: 4.29 MIL/uL (ref 3.87–5.11)
RDW: 13.3 % (ref 11.5–15.5)
WBC: 6.1 10*3/uL (ref 4.0–10.5)
nRBC: 0 % (ref 0.0–0.2)

## 2018-09-19 LAB — URINALYSIS, ROUTINE W REFLEX MICROSCOPIC
Bilirubin Urine: NEGATIVE
Glucose, UA: >=500 mg/dL — AB
Ketones, ur: NEGATIVE mg/dL
Nitrite: NEGATIVE
Protein, ur: 30 mg/dL — AB
Specific Gravity, Urine: 1.018 (ref 1.005–1.030)
pH: 5 (ref 5.0–8.0)

## 2018-09-19 LAB — BASIC METABOLIC PANEL
Anion gap: 15 (ref 5–15)
BUN: 54 mg/dL — ABNORMAL HIGH (ref 8–23)
CO2: 21 mmol/L — ABNORMAL LOW (ref 22–32)
Calcium: 9 mg/dL (ref 8.9–10.3)
Chloride: 100 mmol/L (ref 98–111)
Creatinine, Ser: 1.92 mg/dL — ABNORMAL HIGH (ref 0.44–1.00)
GFR calc Af Amer: 30 mL/min — ABNORMAL LOW (ref >60)
GFR calc non Af Amer: 26 mL/min — ABNORMAL LOW (ref >60)
Glucose, Bld: 263 mg/dL — ABNORMAL HIGH (ref 70–99)
Potassium: 3.6 mmol/L (ref 3.5–5.1)
Sodium: 136 mmol/L (ref 135–145)

## 2018-09-19 LAB — CBG MONITORING, ED: Glucose-Capillary: 266 mg/dL — ABNORMAL HIGH (ref 70–99)

## 2018-09-19 MED ORDER — SODIUM CHLORIDE 0.9 % IV BOLUS
1000.0000 mL | Freq: Once | INTRAVENOUS | Status: AC
  Administered 2018-09-19: 1000 mL via INTRAVENOUS

## 2018-09-19 MED ORDER — CIPROFLOXACIN HCL 500 MG PO TABS: 500.0000 mg | ORAL_TABLET | Freq: Two times a day (BID) | ORAL | 0 refills | Status: DC

## 2018-09-19 MED ORDER — CIPROFLOXACIN IN D5W 400 MG/200ML IV SOLN
400.0000 mg | Freq: Once | INTRAVENOUS | Status: AC
  Administered 2018-09-19: 400 mg via INTRAVENOUS
  Filled 2018-09-19: qty 200

## 2018-09-19 NOTE — Discharge Instructions (Addendum)
Do not take your hydrochlorothiazide for 1 week and then resume as previously taking. I want you to follow -up with your PCP at the end of this week or next week.

## 2018-09-19 NOTE — ED Provider Notes (Signed)
[facility]-EMERGENCY DEPT Provider Note   CSN: [id number] Arrival date & time: 09/19/18  1804    History   Chief Complaint Chief Complaint  Patient presents with  . Fall  . Loss of Consciousness    HPI Cynthia Hunt is a 73 y.o. female.     HPI   72yF presenting after fall. Fell in the shower. Says she just wet down. Unsure if she passed out or not. Denies prodrome but says she has felt intermittently dizzy as if she may pass out of the past week or so. Denies any injury from the fall. Orthostatic for EMS. Denies new meds. No fever. No acute neuro complaints.   Past Medical History:  Diagnosis Date  . Depression   . Diabetes mellitus without complication (HCC)   . Hyperlipidemia   . Hypertension     Patient Active Problem List   Diagnosis Date Noted  . Uncontrolled type 2 diabetes mellitus without complication, with long-term current use of insulin (HCC) 02/13/2016    History reviewed. No pertinent surgical history.   OB History   No obstetric history on file.      Home Medications    Prior to Admission medications   Medication Sig Start Date End Date Taking? Authorizing Provider  ALPRAZolam Prudy Feeler) 0.5 MG tablet Take 0.5 mg by mouth daily as needed for anxiety.  12/26/15  Yes [provider]  diphenhydrAMINE (BENADRYL) 25 MG tablet Take 25 mg by mouth daily as needed for allergies.   Yes [provider]  LANTUS SOLOSTAR 100 UNIT/ML Solostar Pen Inject 30 Units into the skin at bedtime.  01/27/16  Yes [provider]  losartan (COZAAR) 50 MG tablet Take 50 mg by mouth daily.  01/30/16  Yes [provider]  metFORMIN (GLUCOPHAGE) 1000 MG tablet Take 1,000 mg by mouth daily.  01/12/16  Yes [provider]  methocarbamol (ROBAXIN) 500 MG tablet Take 500 mg by mouth daily.  02/02/16  Yes [provider]  simvastatin (ZOCOR) 40 MG tablet Take 40 mg by mouth daily.  01/27/16  Yes [provider]  traZODone (DESYREL) 100 MG tablet Take 100 mg by mouth at bedtime.  01/03/16  Yes [provider]  Alcohol Swabs (ALCOHOL PREP) 70 % PADS  02/11/16   [provider]  Blood Glucose Calibration (FREESTYLE CONTROL SOLUTION) LIQD  02/11/16   [provider]  FREESTYLE LITE test strip  02/11/16   [provider]  hydrochlorothiazide (HYDRODIURIL) 25 MG tablet Take 25 mg by mouth daily.  02/12/16   [provider]  Lancets (FREESTYLE) lancets  02/11/16   [provider]    Family History No family history on file.  Social History Social History   Tobacco Use  . Smoking status: Former Smoker    Last attempt to quit: 03/18/2006    Years since quitting: 12.5  . Smokeless tobacco: Never Used  Substance Use Topics  . Alcohol use: Yes    Comment: twice a year  . Drug use: Not on file     Allergies   Penicillins   Review of Systems Review of Systems  All systems reviewed and negative, other than as noted in HPI.  Physical Exam Updated Vital Signs BP 114/65   Pulse 94   Temp 98.6 F (37 C) (Oral)   Resp 16   SpO2 91%   Physical Exam Vitals signs and nursing note reviewed.  Constitutional:      General: She  is not in acute distress.    Appearance: She is well-developed.  HENT:     Head: Normocephalic and atraumatic.  Eyes:     General:        Right eye: No discharge.        Left eye: No discharge.     Conjunctiva/sclera: Conjunctivae normal.  Neck:     Musculoskeletal: Neck supple.  Cardiovascular:     Rate and Rhythm: Normal rate and regular rhythm.     Heart sounds: Normal heart sounds. No murmur. No friction rub. No gallop.   Pulmonary:     Effort: Pulmonary effort is normal. No respiratory distress.     Breath sounds: Normal breath sounds.  Abdominal:     General: There is no distension.     Palpations: Abdomen is soft.     Tenderness: There is no abdominal tenderness.  Musculoskeletal:         General: No tenderness.  Skin:    General: Skin is warm and dry.  Neurological:     Mental Status: She is alert.  Psychiatric:        Behavior: Behavior normal.        Thought Content: Thought content normal.      ED Treatments / Results  Labs (all labs ordered are listed, but only abnormal results are displayed) Labs Reviewed  CBC WITH DIFFERENTIAL/PLATELET - Abnormal; Notable for the following components:      Result Value   Hemoglobin 11.9 (*)    MCV 103.0 (*)    MCHC 26.9 (*)    All other components within normal limits  BASIC METABOLIC PANEL - Abnormal; Notable for the following components:   CO2 21 (*)    Glucose, Bld 263 (*)    BUN 54 (*)    Creatinine, Ser 1.92 (*)    GFR calc non Af Amer 26 (*)    GFR calc Af Amer 30 (*)    All other components within normal limits  URINALYSIS, ROUTINE W REFLEX MICROSCOPIC - Abnormal; Notable for the following components:   APPearance HAZY (*)    Glucose, UA >=500 (*)    Hgb urine dipstick SMALL (*)    Protein, ur 30 (*)    Leukocytes,Ua TRACE (*)    Bacteria, UA MANY (*)    All other components within normal limits  CBG MONITORING, ED - Abnormal; Notable for the following components:   Glucose-Capillary 266 (*)    All other components within normal limits  URINE CULTURE    EKG EKG Interpretation  Date/Time:  Tuesday September 19 2018 18:29:02 EDT Ventricular Rate:  81 PR Interval:    QRS Duration: 104 QT Interval:  375 QTC Calculation: 436 R Axis:   34 Text Interpretation:  Unknown rhythm, irregular rate Probable inferior infarct, age indeterminate Confirmed by Raeford Razor [phone]) on 09/19/2018 6:33:56 PM   Radiology No results found.  Procedures Procedures (including critical care time)  Medications Ordered in ED Medications  ciprofloxacin (CIPRO) IVPB 400 mg (has no administration in time range)  sodium chloride 0.9 % bolus 1,000 mL (1,000 mLs Intravenous New Bag/Given 09/19/18 1948)     Initial  Impression / Assessment and Plan / ED Course  I have reviewed the triage vital signs and the nursing notes.  Pertinent labs & imaging results that were available during my care of the patient were reviewed by me and considered in my medical decision making (see chart for details).  72yF with falls. Very orthostatic. Renal function noted. She was given IVF in the ED> Advised to hold HCTZ for 1 week. Abx for UTI. She has ambulated here in the ED w/o complaint. At this point I think she is appropriate for DC. CLose outpt FU for re-eval and repeat BMP.   Final Clinical Impressions(s) / ED Diagnoses   Final diagnoses:  Orthostatic hypotension  Urinary tract infection without hematuria, site unspecified    ED Discharge Orders    None       Raeford Razor, MD 09/20/18 1919

## 2018-09-19 NOTE — ED Notes (Signed)
Bed: HY86 Expected date:  Expected time:  Means of arrival:  Comments: 70sF fall, positive orthostatics

## 2018-09-19 NOTE — ED Notes (Signed)
Walked pt to bathroom/ plus 2 ( no problem)

## 2018-09-19 NOTE — ED Triage Notes (Signed)
Pt transported from home by EMS. Pt fell in shower. EMS witnessed a single syncopal episode and 2 near syncopal episodes while attempting orthostatics, but pt denies syncope. Denies SOB and N/V. Pt denies hitting head in fall, but complains of upper back pain from position stuck in shower for 30 min. Pt persistent that "she is fine", but EMS states she is unstable. Pt complains of dizziness x 2 days.   HR 84 R 18  99% on RA  CBG 286    Standing 70/40 Sitting 131/67   22 gauge in left hand

## 2018-09-23 LAB — URINE CULTURE: Culture: >=100000 — AB

## 2018-09-24 ENCOUNTER — Telehealth: Payer: Self-pay | Admitting: Emergency Medicine

## 2018-09-26 DIAGNOSIS — I951 Orthostatic hypotension: Secondary | ICD-10-CM | POA: Diagnosis not present

## 2018-09-26 DIAGNOSIS — I10 Essential (primary) hypertension: Secondary | ICD-10-CM | POA: Diagnosis not present

## 2018-09-26 DIAGNOSIS — R5381 Other malaise: Secondary | ICD-10-CM | POA: Diagnosis not present

## 2018-09-26 DIAGNOSIS — R55 Syncope and collapse: Secondary | ICD-10-CM | POA: Diagnosis not present

## 2018-09-26 DIAGNOSIS — Z79899 Other long term (current) drug therapy: Secondary | ICD-10-CM | POA: Diagnosis not present

## 2018-09-26 DIAGNOSIS — E1165 Type 2 diabetes mellitus with hyperglycemia: Secondary | ICD-10-CM | POA: Diagnosis not present

## 2018-09-26 DIAGNOSIS — Z794 Long term (current) use of insulin: Secondary | ICD-10-CM | POA: Diagnosis not present

## 2018-10-22 ENCOUNTER — Inpatient Hospital Stay (HOSPITAL_COMMUNITY): Payer: HMO

## 2018-10-22 ENCOUNTER — Emergency Department (HOSPITAL_COMMUNITY): Payer: HMO

## 2018-10-22 ENCOUNTER — Inpatient Hospital Stay (HOSPITAL_COMMUNITY)
Admission: EM | Admit: 2018-10-22 | Discharge: 2018-10-25 | DRG: 065 | Disposition: A | Discharge status: Rehabilitation Facility | Payer: HMO | Attending: Internal Medicine | Admitting: Internal Medicine

## 2018-10-22 ENCOUNTER — Encounter (HOSPITAL_COMMUNITY): Payer: Self-pay

## 2018-10-22 ENCOUNTER — Other Ambulatory Visit: Payer: Self-pay

## 2018-10-22 ENCOUNTER — Other Ambulatory Visit (HOSPITAL_COMMUNITY): Payer: HMO

## 2018-10-22 DIAGNOSIS — Z880 Allergy status to penicillin: Secondary | ICD-10-CM | POA: Diagnosis not present

## 2018-10-22 DIAGNOSIS — I639 Cerebral infarction, unspecified: Secondary | ICD-10-CM

## 2018-10-22 DIAGNOSIS — F329 Major depressive disorder, single episode, unspecified: Secondary | ICD-10-CM | POA: Diagnosis not present

## 2018-10-22 DIAGNOSIS — R0989 Other specified symptoms and signs involving the circulatory and respiratory systems: Secondary | ICD-10-CM | POA: Diagnosis not present

## 2018-10-22 DIAGNOSIS — I63411 Cerebral infarction due to embolism of right middle cerebral artery: Principal | ICD-10-CM | POA: Diagnosis present

## 2018-10-22 DIAGNOSIS — F419 Anxiety disorder, unspecified: Secondary | ICD-10-CM | POA: Diagnosis not present

## 2018-10-22 DIAGNOSIS — E785 Hyperlipidemia, unspecified: Secondary | ICD-10-CM | POA: Diagnosis present

## 2018-10-22 DIAGNOSIS — E1122 Type 2 diabetes mellitus with diabetic chronic kidney disease: Secondary | ICD-10-CM | POA: Diagnosis not present

## 2018-10-22 DIAGNOSIS — Z6832 Body mass index (BMI) 32.0-32.9, adult: Secondary | ICD-10-CM | POA: Diagnosis not present

## 2018-10-22 DIAGNOSIS — I69354 Hemiplegia and hemiparesis following cerebral infarction affecting left non-dominant side: Secondary | ICD-10-CM | POA: Diagnosis not present

## 2018-10-22 DIAGNOSIS — Z794 Long term (current) use of insulin: Secondary | ICD-10-CM

## 2018-10-22 DIAGNOSIS — Z87891 Personal history of nicotine dependence: Secondary | ICD-10-CM

## 2018-10-22 DIAGNOSIS — G8194 Hemiplegia, unspecified affecting left nondominant side: Secondary | ICD-10-CM | POA: Diagnosis not present

## 2018-10-22 DIAGNOSIS — I160 Hypertensive urgency: Secondary | ICD-10-CM | POA: Diagnosis present

## 2018-10-22 DIAGNOSIS — Z7984 Long term (current) use of oral hypoglycemic drugs: Secondary | ICD-10-CM | POA: Diagnosis not present

## 2018-10-22 DIAGNOSIS — R29810 Facial weakness: Secondary | ICD-10-CM | POA: Diagnosis not present

## 2018-10-22 DIAGNOSIS — E6609 Other obesity due to excess calories: Secondary | ICD-10-CM | POA: Diagnosis not present

## 2018-10-22 DIAGNOSIS — R471 Dysarthria and anarthria: Secondary | ICD-10-CM | POA: Diagnosis not present

## 2018-10-22 DIAGNOSIS — E1165 Type 2 diabetes mellitus with hyperglycemia: Secondary | ICD-10-CM | POA: Diagnosis not present

## 2018-10-22 DIAGNOSIS — I69322 Dysarthria following cerebral infarction: Secondary | ICD-10-CM | POA: Diagnosis not present

## 2018-10-22 DIAGNOSIS — N183 Chronic kidney disease, stage 3 (moderate): Secondary | ICD-10-CM | POA: Diagnosis not present

## 2018-10-22 DIAGNOSIS — I351 Nonrheumatic aortic (valve) insufficiency: Secondary | ICD-10-CM | POA: Diagnosis not present

## 2018-10-22 DIAGNOSIS — Z7951 Long term (current) use of inhaled steroids: Secondary | ICD-10-CM

## 2018-10-22 DIAGNOSIS — D62 Acute posthemorrhagic anemia: Secondary | ICD-10-CM | POA: Diagnosis not present

## 2018-10-22 DIAGNOSIS — I10 Essential (primary) hypertension: Secondary | ICD-10-CM | POA: Diagnosis not present

## 2018-10-22 DIAGNOSIS — I6319 Cerebral infarction due to embolism of other precerebral artery: Secondary | ICD-10-CM

## 2018-10-22 DIAGNOSIS — R931 Abnormal findings on diagnostic imaging of heart and coronary circulation: Secondary | ICD-10-CM | POA: Diagnosis not present

## 2018-10-22 DIAGNOSIS — E1142 Type 2 diabetes mellitus with diabetic polyneuropathy: Secondary | ICD-10-CM | POA: Diagnosis not present

## 2018-10-22 DIAGNOSIS — Z79899 Other long term (current) drug therapy: Secondary | ICD-10-CM | POA: Diagnosis not present

## 2018-10-22 DIAGNOSIS — Z823 Family history of stroke: Secondary | ICD-10-CM

## 2018-10-22 DIAGNOSIS — R000 Tachycardia, unspecified: Secondary | ICD-10-CM | POA: Diagnosis not present

## 2018-10-22 DIAGNOSIS — E669 Obesity, unspecified: Secondary | ICD-10-CM | POA: Diagnosis not present

## 2018-10-22 DIAGNOSIS — E876 Hypokalemia: Secondary | ICD-10-CM | POA: Diagnosis not present

## 2018-10-22 DIAGNOSIS — I129 Hypertensive chronic kidney disease with stage 1 through stage 4 chronic kidney disease, or unspecified chronic kidney disease: Secondary | ICD-10-CM | POA: Diagnosis not present

## 2018-10-22 DIAGNOSIS — I69392 Facial weakness following cerebral infarction: Secondary | ICD-10-CM | POA: Diagnosis not present

## 2018-10-22 DIAGNOSIS — I6522 Occlusion and stenosis of left carotid artery: Secondary | ICD-10-CM | POA: Diagnosis not present

## 2018-10-22 DIAGNOSIS — G8190 Hemiplegia, unspecified affecting unspecified side: Secondary | ICD-10-CM | POA: Diagnosis not present

## 2018-10-22 DIAGNOSIS — I63511 Cerebral infarction due to unspecified occlusion or stenosis of right middle cerebral artery: Secondary | ICD-10-CM | POA: Diagnosis not present

## 2018-10-22 DIAGNOSIS — E1151 Type 2 diabetes mellitus with diabetic peripheral angiopathy without gangrene: Secondary | ICD-10-CM | POA: Diagnosis not present

## 2018-10-22 DIAGNOSIS — IMO0001 Reserved for inherently not codable concepts without codable children: Secondary | ICD-10-CM

## 2018-10-22 DIAGNOSIS — I169 Hypertensive crisis, unspecified: Secondary | ICD-10-CM | POA: Diagnosis not present

## 2018-10-22 DIAGNOSIS — R7309 Other abnormal glucose: Secondary | ICD-10-CM | POA: Diagnosis not present

## 2018-10-22 DIAGNOSIS — R51 Headache: Secondary | ICD-10-CM | POA: Diagnosis not present

## 2018-10-22 DIAGNOSIS — R531 Weakness: Secondary | ICD-10-CM | POA: Diagnosis not present

## 2018-10-22 DIAGNOSIS — K5901 Slow transit constipation: Secondary | ICD-10-CM | POA: Diagnosis not present

## 2018-10-22 LAB — COMPREHENSIVE METABOLIC PANEL
ALT: 20 U/L (ref 0–44)
AST: 22 U/L (ref 15–41)
Albumin: 3.5 g/dL (ref 3.5–5.0)
Alkaline Phosphatase: 49 U/L (ref 38–126)
Anion gap: 18 — ABNORMAL HIGH (ref 5–15)
BUN: 18 mg/dL (ref 8–23)
CO2: 22 mmol/L (ref 22–32)
Calcium: 8.2 mg/dL — ABNORMAL LOW (ref 8.9–10.3)
Chloride: 100 mmol/L (ref 98–111)
Creatinine, Ser: 1.34 mg/dL — ABNORMAL HIGH (ref 0.44–1.00)
GFR calc Af Amer: 46 mL/min — ABNORMAL LOW (ref >60)
GFR calc non Af Amer: 39 mL/min — ABNORMAL LOW (ref >60)
Glucose, Bld: 175 mg/dL — ABNORMAL HIGH (ref 70–99)
Potassium: 3.4 mmol/L — ABNORMAL LOW (ref 3.5–5.1)
Sodium: 140 mmol/L (ref 135–145)
Total Bilirubin: 0.8 mg/dL (ref 0.3–1.2)
Total Protein: 7.7 g/dL (ref 6.5–8.1)

## 2018-10-22 LAB — HEMOGLOBIN A1C
Hgb A1c MFr Bld: 9.2 % — ABNORMAL HIGH (ref 4.8–5.6)
Mean Plasma Glucose: 217.34 mg/dL

## 2018-10-22 LAB — DIFFERENTIAL
Abs Immature Granulocytes: 0.02 10*3/uL (ref 0.00–0.07)
Basophils Absolute: 0 10*3/uL (ref 0.0–0.1)
Basophils Relative: 0 %
Eosinophils Absolute: 0 10*3/uL (ref 0.0–0.5)
Eosinophils Relative: 0 %
Immature Granulocytes: 0 %
Lymphocytes Relative: 12 %
Lymphs Abs: 0.9 10*3/uL (ref 0.7–4.0)
Monocytes Absolute: 0.3 10*3/uL (ref 0.1–1.0)
Monocytes Relative: 4 %
Neutro Abs: 6.1 10*3/uL (ref 1.7–7.7)
Neutrophils Relative %: 84 %

## 2018-10-22 LAB — CBC
HCT: 35 % — ABNORMAL LOW (ref 36.0–46.0)
Hemoglobin: 10.5 g/dL — ABNORMAL LOW (ref 12.0–15.0)
MCH: 28.3 pg (ref 26.0–34.0)
MCHC: 30 g/dL (ref 30.0–36.0)
MCV: 94.3 fL (ref 80.0–100.0)
Platelets: 238 10*3/uL (ref 150–400)
RBC: 3.71 MIL/uL — ABNORMAL LOW (ref 3.87–5.11)
RDW: 13.6 % (ref 11.5–15.5)
WBC: 7.4 10*3/uL (ref 4.0–10.5)
nRBC: 0 % (ref 0.0–0.2)

## 2018-10-22 LAB — PROTIME-INR
INR: 1.1 (ref 0.8–1.2)
Prothrombin Time: 14.1 seconds (ref 11.4–15.2)

## 2018-10-22 LAB — ECHOCARDIOGRAM LIMITED
Height: 67 in
Weight: 3280 oz

## 2018-10-22 LAB — CK: Total CK: 69 U/L (ref 38–234)

## 2018-10-22 LAB — APTT: aPTT: 29 seconds (ref 24–36)

## 2018-10-22 LAB — ETHANOL: Alcohol, Ethyl (B): <10 mg/dL (ref <10)

## 2018-10-22 LAB — I-STAT CREATININE, ED: Creatinine, Ser: 1.1 mg/dL — ABNORMAL HIGH (ref 0.44–1.00)

## 2018-10-22 LAB — TSH: TSH: 1.061 u[IU]/mL (ref 0.350–4.500)

## 2018-10-22 MED ORDER — GADOBUTROL 1 MMOL/ML IV SOLN
10.0000 mL | Freq: Once | INTRAVENOUS | Status: AC | PRN
  Administered 2018-10-22: 10 mL via INTRAVENOUS

## 2018-10-22 MED ORDER — INSULIN GLARGINE 100 UNIT/ML ~~LOC~~ SOLN
30.0000 [IU] | Freq: Every day | SUBCUTANEOUS | Status: DC
  Administered 2018-10-22 – 2018-10-24 (×3): 30 [IU] via SUBCUTANEOUS
  Filled 2018-10-22 (×4): qty 0.3

## 2018-10-22 MED ORDER — LORAZEPAM 2 MG/ML IJ SOLN: 0.5000 mg | Freq: Once | INTRAMUSCULAR | Status: DC

## 2018-10-22 MED ORDER — ACETAMINOPHEN 325 MG PO TABS: 650.0000 mg | ORAL_TABLET | ORAL | Status: DC | PRN

## 2018-10-22 MED ORDER — METHOCARBAMOL 500 MG PO TABS
500.0000 mg | ORAL_TABLET | Freq: Every day | ORAL | Status: DC
  Administered 2018-10-22 – 2018-10-25 (×4): 500 mg via ORAL
  Filled 2018-10-22 (×4): qty 1

## 2018-10-22 MED ORDER — SODIUM CHLORIDE 0.9 % IV BOLUS
1000.0000 mL | Freq: Once | INTRAVENOUS | Status: AC
  Administered 2018-10-22: 1000 mL via INTRAVENOUS

## 2018-10-22 MED ORDER — TRAZODONE HCL 100 MG PO TABS
100.0000 mg | ORAL_TABLET | Freq: Every day | ORAL | Status: DC
  Administered 2018-10-22 – 2018-10-24 (×3): 100 mg via ORAL
  Filled 2018-10-22: qty 1
  Filled 2018-10-22 (×2): qty 2

## 2018-10-22 MED ORDER — ACETAMINOPHEN 650 MG RE SUPP: 650.0000 mg | RECTAL | Status: DC | PRN

## 2018-10-22 MED ORDER — STROKE: EARLY STAGES OF RECOVERY BOOK: Freq: Once | Status: DC

## 2018-10-22 MED ORDER — ASPIRIN 300 MG RE SUPP: 300.0000 mg | Freq: Every day | RECTAL | Status: DC

## 2018-10-22 MED ORDER — INSULIN ASPART 100 UNIT/ML ~~LOC~~ SOLN
0.0000 [IU] | Freq: Every day | SUBCUTANEOUS | Status: DC
  Administered 2018-10-22: 23:00:00 2 [IU] via SUBCUTANEOUS

## 2018-10-22 MED ORDER — ALPRAZOLAM 0.5 MG PO TABS: 0.5000 mg | ORAL_TABLET | Freq: Every day | ORAL | Status: DC | PRN

## 2018-10-22 MED ORDER — ACETAMINOPHEN 160 MG/5ML PO SOLN: 650.0000 mg | ORAL | Status: DC | PRN

## 2018-10-22 MED ORDER — SODIUM CHLORIDE 0.9 % IV SOLN
INTRAVENOUS | Status: DC
  Administered 2018-10-22: 11:00:00 via INTRAVENOUS

## 2018-10-22 MED ORDER — ASPIRIN 325 MG PO TABS
325.0000 mg | ORAL_TABLET | Freq: Every day | ORAL | Status: DC
  Administered 2018-10-22 – 2018-10-23 (×2): 325 mg via ORAL
  Filled 2018-10-22 (×2): qty 1

## 2018-10-22 MED ORDER — ATORVASTATIN CALCIUM 40 MG PO TABS
40.0000 mg | ORAL_TABLET | Freq: Every day | ORAL | Status: DC
  Administered 2018-10-22 – 2018-10-24 (×3): 40 mg via ORAL
  Filled 2018-10-22 (×3): qty 1

## 2018-10-22 MED ORDER — ASPIRIN 325 MG PO TABS
325.0000 mg | ORAL_TABLET | Freq: Every day | ORAL | Status: DC
  Administered 2018-10-22: 325 mg via ORAL

## 2018-10-22 MED ORDER — SENNOSIDES-DOCUSATE SODIUM 8.6-50 MG PO TABS: 1.0000 | ORAL_TABLET | Freq: Every evening | ORAL | Status: DC | PRN

## 2018-10-22 MED ORDER — ENOXAPARIN SODIUM 40 MG/0.4ML ~~LOC~~ SOLN
40.0000 mg | SUBCUTANEOUS | Status: DC
  Administered 2018-10-22 – 2018-10-25 (×3): 40 mg via SUBCUTANEOUS
  Filled 2018-10-22 (×3): qty 0.4

## 2018-10-22 MED ORDER — STROKE: EARLY STAGES OF RECOVERY BOOK
Freq: Once | Status: AC
  Administered 2018-10-22: 11:00:00

## 2018-10-22 MED ORDER — INSULIN ASPART 100 UNIT/ML ~~LOC~~ SOLN
0.0000 [IU] | Freq: Three times a day (TID) | SUBCUTANEOUS | Status: DC
  Administered 2018-10-23: 8 [IU] via SUBCUTANEOUS
  Administered 2018-10-23: 07:00:00 2 [IU] via SUBCUTANEOUS
  Administered 2018-10-23: 12:00:00 8 [IU] via SUBCUTANEOUS
  Administered 2018-10-25: 3 [IU] via SUBCUTANEOUS

## 2018-10-22 NOTE — Progress Notes (Signed)
Carotid duplex has been completed.   Preliminary results in CV Proc.   Blanch Media 10/22/2018 11:48 AM

## 2018-10-22 NOTE — ED Notes (Signed)
Patient transported to CT 

## 2018-10-22 NOTE — Procedures (Signed)
Second attempt at echo made. Patient out of room at another test. Will attempt again.
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
## 2018-10-22 NOTE — ED Notes (Signed)
Social Worker at pt bedside.
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
## 2018-10-22 NOTE — ED Notes (Signed)
Attempted report x1; left name and number with Diplomatic Services operational officer for RN to call back.

## 2018-10-22 NOTE — Progress Notes (Signed)
  Echocardiogram 2D Echocardiogram has been performed.  Cynthia Hunt 10/22/2018, 3:27 PM
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
## 2018-10-22 NOTE — Progress Notes (Signed)
PT Cancellation Note  Patient Details Name: Cynthia Hunt MRN: [id number] DOB: 1945/10/04   Cancelled Treatment:    Reason Eval/Treat Not Completed: Patient at procedure or test/unavailable. Pt off the floor at MRI. PT to return as able to complete PT eval.  Lewis Shock, PT, DPT Acute Rehabilitation Services Pager #: [phone] Office #: [phone]    Iona Hansen 10/22/2018, 12:49 PM

## 2018-10-22 NOTE — Procedures (Signed)
Echo attempted. Nurse stated patient was waiting on arrival of bedside toilet. Will attempt again.

## 2018-10-22 NOTE — ED Notes (Signed)
Pt returned to room from CT

## 2018-10-22 NOTE — ED Notes (Signed)
ED TO INPATIENT HANDOFF REPORT  ED Nurse Name and Phone #: Osborne CascoNadia, RN  S Name/Age/Gender Cynthia Hunt 73 y.o. female Room/Bed: 030C/030C  Code Status   Code Status: Full Code  Home/SNF/Other Home Patient oriented to: self, place, time and situation Is this baseline? Yes   Triage Complete: Triage complete  Chief Complaint stroke sx  Triage Note Pt arrives with [facility] from home c/o stroke-like symptoms that happened on Friday. Pt sled out of bed this morning and reports having left side weakness and facial droop. Pt also has right sided gaze. Pt reports her goddaughter has lived with her for the past year and has not been helping her since Friday. The pt also states she was in her room since Friday without food or water; pt reports she was unable to get them herself. Social work consult may be needed. MD notified.   Allergies Allergies  Allergen Reactions  . Penicillins     Childhood Allergy Did it involve swelling of the face/tongue/throat, SOB, or low BP? Unknown Did it involve sudden or severe rash/hives, skin peeling, or any reaction on the inside of your mouth or nose? Unknown Did you need to seek medical attention at a hospital or doctor's office? Unknown When did it last happen?Unknown If all above answers are "NO", may proceed with cephalosporin use.     Level of Care/Admitting Diagnosis ED Disposition    ED Disposition Condition Comment   Admit  [facility] [[id number]]  Level of Care: Telemetry Medical [104]  Covid Evaluation: N/A  Diagnosis: CVA (cerebral vascular accident) [facility](HCC) [[id number]]) [[id number]]  Admitting Physician: Jonah BlueYATES, JENNIFER [[id number]]  Attending Physician: Jonah BlueYATES, JENNIFER [[id number]]  Estimated length of stay: past midnight tomorrow  Certification:: I certify this patient will need inpatient services for at least 2 midnights  PT Class (Do Not Modify): Inpatient [101]  PT Acc Code (Do Not Modify): Private [1]        B Medical/Surgery History Past Medical History:  Diagnosis Date  . Depression   . Diabetes mellitus without complication (HCC)   . Hyperlipidemia   . Hypertension    Past Surgical History:  Procedure Laterality Date  . UTERINE FIBROID SURGERY       A IV Location/Drains/Wounds Patient Lines/Drains/Airways Status   Active Line/Drains/Airways    Name:   Placement date:   Placement time:   Site:   Days:   Peripheral IV 10/22/18 Right;Distal Forearm   10/22/18    0742    Forearm   less than 1          Intake/Output Last 24 hours No intake or output data in the 24 hours ending 10/22/18 1009  Labs/Imaging Results for orders placed or performed during the hospital encounter of 10/22/18 (from the past 48 hour(s))  Ethanol     Status: None   Collection Time: 10/22/18  7:55 AM  Result Value Ref Range   Alcohol, Ethyl (B) <10 <10 mg/dL    Comment: (NOTE) Lowest detectable limit for serum alcohol is 10 mg/dL. For medical purposes only. Performed at [facility], [address] [place], [place] [address]   Protime-INR     Status: None   Collection Time: 10/22/18  7:55 AM  Result Value Ref Range   Prothrombin Time 14.1 11.4 - 15.2 seconds   INR 1.1 0.8 - 1.2    Comment: (NOTE) INR goal varies based on device and disease states. Performed at [facility], [address], [place], [place] [address]   APTT     Status: None   Collection Time: 10/22/18  7:55 AM  Result Value Ref Range   aPTT 29 24 - 36 seconds    Comment: Performed at [facility], [address], [place], [place] [address]  CBC     Status: Abnormal   Collection Time: 10/22/18  7:55 AM  Result Value Ref Range   WBC 7.4 4.0 - 10.5 K/uL   RBC 3.71 (L) 3.87 - 5.11 MIL/uL   Hemoglobin 10.5 (L) 12.0 - 15.0 g/dL   HCT 09.8 (L) 11.9 - 14.7 %   MCV 94.3 80.0 - 100.0 fL   MCH 28.3 26.0 - 34.0 pg   MCHC 30.0 30.0 - 36.0 g/dL   RDW 82.9 56.2 - 13.0 %   Platelets 238 150 - 400 K/uL   nRBC  0.0 0.0 - 0.2 %    Comment: Performed at [facility], [address] [place], [place] [address]  Differential     Status: None   Collection Time: 10/22/18  7:55 AM  Result Value Ref Range   Neutrophils Relative % 84 %   Neutro Abs 6.1 1.7 - 7.7 K/uL   Lymphocytes Relative 12 %   Lymphs Abs 0.9 0.7 - 4.0 K/uL   Monocytes Relative 4 %   Monocytes Absolute 0.3 0.1 - 1.0 K/uL   Eosinophils Relative 0 %   Eosinophils Absolute 0.0 0.0 - 0.5 K/uL   Basophils Relative 0 %   Basophils Absolute 0.0 0.0 - 0.1 K/uL   Immature Granulocytes 0 %   Abs Immature Granulocytes 0.02 0.00 - 0.07 K/uL    Comment: Performed at [facility], [address], [place], [place] [address]  Comprehensive metabolic panel     Status: Abnormal   Collection Time: 10/22/18  7:55 AM  Result Value Ref Range   Sodium 140 135 - 145 mmol/L   Potassium 3.4 (L) 3.5 - 5.1 mmol/L   Chloride 100 98 - 111 mmol/L   CO2 22 22 - 32 mmol/L   Glucose, Bld 175 (H) 70 - 99 mg/dL   BUN 18 8 - 23 mg/dL   Creatinine, Ser 9.52 (H) 0.44 - 1.00 mg/dL   Calcium 8.2 (L) 8.9 - 10.3 mg/dL   Total Protein 7.7 6.5 - 8.1 g/dL   Albumin 3.5 3.5 - 5.0 g/dL   AST 22 15 - 41 U/L   ALT 20 0 - 44 U/L   Alkaline Phosphatase 49 38 - 126 U/L   Total Bilirubin 0.8 0.3 - 1.2 mg/dL   GFR calc non Af Amer 39 (L) >60 mL/min   GFR calc Af Amer 46 (L) >60 mL/min   Anion gap 18 (H) 5 - 15    Comment: Performed at [facility], [address], [place], [place] [address]  CK     Status: None   Collection Time: 10/22/18  7:55 AM  Result Value Ref Range   Total CK 69 38 - 234 U/L    Comment: Performed at [facility], [address], [place], [place] [address]  I-stat Creatinine, ED     Status: Abnormal   Collection Time: 10/22/18  7:59 AM  Result Value Ref Range   Creatinine, Ser 1.10 (H) 0.44 - 1.00 mg/dL   Ct Head Wo Contrast  Result Date: 10/22/2018 CLINICAL DATA:  Increasing LEFT-sided weakness. Symptoms began 2 days  ago. EXAM: CT HEAD  WITHOUT CONTRAST TECHNIQUE: Contiguous axial images were obtained from the base of the skull through the vertex without intravenous contrast. COMPARISON:  No priors for comparison. FINDINGS: Brain: Age-indeterminate hypodensity of RIGHT basal ganglia, between the caudate and lentiform nucleus, could represent developing acute infarction. Similarly, asymmetric hypoattenuation of the RIGHT centrum semiovale, could be acute or chronic. No definite acute cortical hypodensities. No hemorrhage, mass lesion, hydrocephalus, or extra-axial fluid. Mild cerebral and cerebellar atrophy. Generalized white matter hypoattenuation could represent small vessel disease. Vascular: Calcification of the cavernous internal carotid arteries consistent with cerebrovascular atherosclerotic disease. No signs of intracranial large vessel occlusion. Skull: Intact. Sinuses/Orbits: No acute sinus findings. Conjugate gaze to the RIGHT. Other: None. IMPRESSION: 1. Age-indeterminate RIGHT basal ganglia and RIGHT centrum semiovale hypodensities could represent acute or chronic infarction. 2. If no contraindications, MRI of the brain without contrast recommended for further evaluation. Electronically Signed   By: Elsie Stain M.D.   On: 10/22/2018 08:28    Pending Labs Unresulted Labs (From admission, onward)    Start     Ordered   10/23/18 0500  Hemoglobin A1c  Tomorrow morning,   R     10/22/18 0907   10/23/18 0500  Lipid panel  Tomorrow morning,   R     10/22/18 0907   10/22/18 0959  Hemoglobin A1c  Once,   R    Comments:  To assess prior glycemic control    10/22/18 1000          Vitals/Pain Today's Vitals   10/22/18 0752 10/22/18 0752 10/22/18 0845 10/22/18 0849  BP:   (!) 188/91   Pulse:   99   Resp:   18   Temp:  98.4 F (36.9 C)    TempSrc:  Oral    SpO2:   98%   Weight: 93 kg     Height:  (1.702 m)     PainSc:    5     Isolation Precautions No active  isolations  Medications Medications  LORazepam (ATIVAN) injection 0.5 mg (has no administration in time range)  atorvastatin (LIPITOR) tablet 40 mg (has no administration in time range)  ALPRAZolam (XANAX) tablet 0.5 mg (has no administration in time range)  traZODone (DESYREL) tablet 100 mg (has no administration in time range)  Insulin Glargine (LANTUS) Solostar Pen 30 Units (has no administration in time range)  methocarbamol (ROBAXIN) tablet 500 mg (has no administration in time range)  insulin aspart (novoLOG) injection 0-15 Units (has no administration in time range)  enoxaparin (LOVENOX) injection 40 mg (has no administration in time range)   stroke: mapping our early stages of recovery book (has no administration in time range)  0.9 %  sodium chloride infusion (has no administration in time range)  acetaminophen (TYLENOL) tablet 650 mg (has no administration in time range)    Or  acetaminophen (TYLENOL) solution 650 mg (has no administration in time range)    Or  acetaminophen (TYLENOL) suppository 650 mg (has no administration in time range)  senna-docusate (Senokot-S) tablet 1 tablet (has no administration in time range)  aspirin suppository 300 mg (has no administration in time range)    Or  aspirin tablet 325 mg (has no administration in time range)  insulin aspart (novoLOG) injection 0-5 Units (has no administration in time range)  sodium chloride 0.9 % bolus 1,000 mL (1,000 mLs Intravenous New Bag/Given 10/22/18 1610)    Mobility walks with device High fall risk   Focused Assessments Neuro Assessment Handoff:  Swallow screen pass? Yes  Cardiac Rhythm: Sinus tachycardia NIH Stroke Scale ( + Modified Stroke Scale Criteria)  Interval: Initial Level of Consciousness (1a.)   : Alert, keenly responsive LOC Questions (1b. )   +: Answers both questions correctly LOC Commands (1c. )   + : Performs both tasks correctly Best Gaze (2. )  +: Partial gaze palsy Visual (3. )   +: No visual loss Facial Palsy (4. )    : Minor paralysis Motor Arm, Left (5a. )   +: Drift Motor Arm, Right (5b. )   +: No drift Motor Leg, Left (6a. )   +: No drift Motor Leg, Right (6b. )   +: No drift Limb Ataxia (7. ): Present in one limb Sensory (8. )   +: Mild-to-moderate sensory loss, patient feels pinprick is less sharp or is dull on the affected side, or there is a loss of superficial pain with pinprick, but patient is aware of being touched Best Language (9. )   +: No aphasia Dysarthria (10. ): Normal Extinction/Inattention (11.)   +: No Abnormality Modified SS Total  +: 3 Complete NIHSS TOTAL: 5     Neuro Assessment:   Neuro Checks:   Initial (10/22/18 0844)  Last Documented NIHSS Modified Score: 3 (10/22/18 0844) Has TPA been given? No If patient is a Neuro Trauma and patient is going to OR before floor call report to 4N Charge nurse: [phone] or [phone]     R Recommendations: See Admitting Provider Note  Report given to:   Additional Notes:
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
## 2018-10-22 NOTE — ED Notes (Signed)
ED Provider at bedside. 

## 2018-10-22 NOTE — Consult Note (Addendum)
Neurology Consultation  Reason for Consult: Stroke  Referring Physician: Dr. Ophelia Charter  CC: Left side weakness   History is obtained from:Chart review/ Stroke   HPI: Cynthia Hunt is a 73 y.o. female with a PMH of HTN( HCTZ, and losartan), HLD(Zocor), DM (on Metformin, and long acting insulin), AKI, remote tobacco use, and depression. She presented today to the ER via EMS with c/o left side weakness that has been present for about 2 days. She states that she went to bed Friday about 3 am, and woke up later that morning about 10 am unable to use her right hand. She has access to the phone but did not call family nor EMS until today She was able to get up and prepare "brunch" on Friday with her left hand, but was unable to do much after that . She states that she didn't notice a facial droop, trouble swallowing, or vision impairment. She did notice left leg weakness, as she needed a cane to walk which she generally does not require for ambulation. Apparently normally she can complete ADL's but was unable to care for herself in the past couple of days d/t the weakness. In this case she has been without proper nutrition and care since the onset of these symptoms. She currently has a HA 5/10. Finally this sequelae and lack of improvement prompted her to call EMS.   She presents with hypertensive urgency with SBP ranging from 180's -200's. She states that she takes medications as prescribed but does not check pressures at home. CT brain was completed and revealed Age-indeterminate RIGHT basal ganglia and RIGHT centrum semiovalehypodensities could represent acute or chronic infarction. This could be supportive of her Left side symptoms.    LKW: 10/20/18 at 0300- she woke up with symptoms at 10 am  tpa given?: no, outside of window  Premorbid modified Rankin scale (mRS): 4 0-Completely asymptomatic and back to baseline post-stroke 1-No significant post stroke disability and can perform usual duties with  stroke symptoms 2-Slight disability-UNABLE to perform all activities but does not need assistance  3-Moderate disability-requires help but walks WITHOUT assistance 4-Needs assistance to walk and tend to bodily needs 5-Severe disability-bedridden, incontinent, needs constant attention 6- Death   ROS: A 14 point ROS was performed and is negative except as noted in the HPI.   Past Medical History:  Diagnosis Date  . Depression   . Diabetes mellitus without complication (HCC)   . Hyperlipidemia   . Hypertension     4-Needs assistance to walk and tend to bodily needs HTN, HLD, DM,   History reviewed. No pertinent family history. Paternal family hx of diabetes   Social History:   reports that she quit smoking about 12 years ago. She has never used smokeless tobacco. She reports current alcohol use. She reports previous drug use. Drug: Marijuana.  Medications No current facility-administered medications for this encounter.   Current Outpatient Medications:  .  Alcohol Swabs (ALCOHOL PREP) 70 % PADS, , Disp: , Rfl:  .  ALPRAZolam (XANAX) 0.5 MG tablet, Take 0.5 mg by mouth daily as needed for anxiety. , Disp: , Rfl:  .  Blood Glucose Calibration (FREESTYLE CONTROL SOLUTION) LIQD, , Disp: , Rfl:  .  ciprofloxacin (CIPRO) 500 MG tablet, Take 1 tablet (500 mg total) by mouth every 12 (twelve) hours., Disp: 10 tablet, Rfl: 0 .  diphenhydrAMINE (BENADRYL) 25 MG tablet, Take 25 mg by mouth daily as needed for allergies., Disp: , Rfl:  .  FREESTYLE LITE  test strip, , Disp: , Rfl:  .  hydrochlorothiazide (HYDRODIURIL) 25 MG tablet, Take 25 mg by mouth daily. , Disp: , Rfl:  .  Lancets (FREESTYLE) lancets, , Disp: , Rfl:  .  LANTUS SOLOSTAR 100 UNIT/ML Solostar Pen, Inject 30 Units into the skin at bedtime. , Disp: , Rfl: 3 .  losartan (COZAAR) 50 MG tablet, Take 50 mg by mouth daily. , Disp: , Rfl:  .  metFORMIN (GLUCOPHAGE) 1000 MG tablet, Take 1,000 mg by mouth daily. , Disp: , Rfl:  .   methocarbamol (ROBAXIN) 500 MG tablet, Take 500 mg by mouth daily. , Disp: , Rfl:  .  simvastatin (ZOCOR) 40 MG tablet, Take 40 mg by mouth daily. , Disp: , Rfl:  .  traZODone (DESYREL) 100 MG tablet, Take 100 mg by mouth at bedtime. , Disp: , Rfl:    Exam: Current vital signs: BP (!) 202/90   Pulse (!) 101   Temp 98.4 F (36.9 C) (Oral)   Resp 19   Ht 5\' 7"  (1.702 m)   Wt 93 kg   SpO2 96%   BMI 32.11 kg/m  Vital signs in last 24 hours: Temp:  [98.4 F (36.9 C)] 98.4 F (36.9 C) (04/26 0752) Pulse Rate:  [101] 101 (04/26 0742) Resp:  [19] 19 (04/26 0742) BP: (202)/(90) 202/90 (04/26 0742) SpO2:  [96 %] 96 % (04/26 0742) Weight:  [93 kg] 93 kg (04/26 0752)  GENERAL: Awake, alert in NAD HEENT: - Normocephalic and atraumatic, dry mm, no LN++, no Thyromegally LUNGS - Clear to auscultation bilaterally with no wheezes CV - S1S2 RRR, no m/r/g, equal pulses bilaterally. ABDOMEN - Soft, nontender, nondistended with normoactive BS Ext: warm, well perfused, intact peripheral pulses, __ edema  NEURO:  Mental Status: AA&Ox3  Language: speech is clear.  Naming, repetition, fluency, and comprehension intact. Cranial Nerves: PERRL 3 mm/brisk. EOMI, visual fields impaired in right medial field- patient states that this is baseline secondary to a stigmatism, L asymmetry, facial sensation intact, hearing intact, tongue/uvula/soft palate midline, normal sternocleidomastoid and trapezius muscle strength. No evidence of tongue atrophy or fibrillations Motor: RUIE, 5/5, RLE 5/5, LLE 3-/5 with drift, LUE Proximal strength 4-/5 with oscillating drift , distal hand  1/5  Tone: is normal and bulk is normal Sensation- Intact to light touch bilaterally no change with temperature, nor pinprick neither, facial, upper nor lower ext's  Coordination:  ataxia LEU, LLE Gait- deferred  NIHSS facial +2, arm +2, Leg +2 =6   Labs I have reviewed labs in epic and the results pertinent to this consultation  are: Labs K 3.4 mild hypokalemia, glucose 175, Cr 1.34, GFR 46 BUN 18, WBC- without leukocytosis 7.4, platelets 238  Hgb 11.9, MCV 103.0, MCHC 26.9 (anemia )  Ethanol <10 mg/DL U/A and drug screen pending   CBC    Component Value Date/Time   WBC 7.4 10/22/2018 0755   RBC 3.71 (L) 10/22/2018 0755   HGB 10.5 (L) 10/22/2018 0755   HCT 35.0 (L) 10/22/2018 0755   PLT 238 10/22/2018 0755   MCV 94.3 10/22/2018 0755   MCH 28.3 10/22/2018 0755   MCHC 30.0 10/22/2018 0755   RDW 13.6 10/22/2018 0755   LYMPHSABS 0.9 10/22/2018 0755   MONOABS 0.3 10/22/2018 0755   EOSABS 0.0 10/22/2018 0755   BASOSABS 0.0 10/22/2018 0755    CMP     Component Value Date/Time   NA 140 10/22/2018 0755   K 3.4 (L) 10/22/2018 0755   CL  100 10/22/2018 0755   CO2 22 10/22/2018 0755   GLUCOSE 175 (H) 10/22/2018 0755   BUN 18 10/22/2018 0755   CREATININE 1.10 (H) 10/22/2018 0759   CALCIUM 8.2 (L) 10/22/2018 0755   PROT 7.7 10/22/2018 0755   ALBUMIN 3.5 10/22/2018 0755   AST 22 10/22/2018 0755   ALT 20 10/22/2018 0755   ALKPHOS 49 10/22/2018 0755   BILITOT 0.8 10/22/2018 0755   GFRNONAA 39 (L) 10/22/2018 0755   GFRAA 46 (L) 10/22/2018 0755    Lipid Panel  No results found for: CHOL, TRIG, HDL, CHOLHDL, VLDL, LDLCALC, LDLDIRECT   Imaging I have reviewed the images obtained:  CT-scan of the brain 1. Age-indeterminate RIGHT basal ganglia and RIGHT centrum semiovale hypodensities could represent acute or chronic infarction. 2. If no contraindications, MRI of the brain without contrast recommended for further evaluation  MRI examination of the brain Pending   Assessment:  Cynthia Hunt is a 73 y.o. female with a PMH of HTN, HLD, DM (on Metformin), AKI, remote tobacco use, and depression. She presented today to the ER via EMS with c/o left side weakness that has been present for about 2 days. She presents with hypertensive urgency SBP 180's-200's  Impression: CT with evidence of acute to  sub-acute right sided basal ganglia centrum semiovale stroke, etiology likely small vessel with uncontrolled risk factors.   MRI pending  Recommendations: Recommend #Admit to hospitalist for stroke workup  # MRI of the brain without contrast- pending  #MRA Head, Carotid doppler (not ordering CTA due to borderline renal fx) #Transthoracic Echo, may need TEE and loop monitor  # Start patient on ASA  daily #Zocor at home 40 mg  # BP goal: Permissive since symptoms have been approximately 2 days- goal for 48-72 hrs, After permissive phase SBP slowly decrease to goal <140 on discharge # HBAIC and Lipid profile in the am  # Telemetry monitoring # Frequent neuro checks # NPO until passes stroke swallow screen-  # PT/OT/ST # discussed the importance of blood pressure monitoring at home  # Social work to follow with patient with social impairment and possibility of rehab- she is reluctant for rehab due to inability to have alternate care for pets   # please page stroke NP  Or  PA  Or MD from 8am -4 pm  as this patient from this time will be  followed by the stroke.   You can look them up on [email or web address]  Password [email or web address]  -- Oralia Rud NP- neuro-hospitalist   Attending Neurohospitalist Addendum Patient seen and examined with APP/Resident. Agree with the history documented above. On my examination, patient is awake alert oriented x3. Speech is mildly dysarthric.  No aphasia. She has mild flattening of the left nasolabial fold. She has mild drift on the left upper extremity She has some weakness but no drift in the left lower extremity. Right side is full strength. No other cranial nerve deficits Sensations intact I have independently reviewed the chart, obtained history, review of systems and examined the patient.I have personally reviewed pertinent head/neck/spine imaging (CT/MRI).  CT head with a possible right basal ganglia lacunar infarct-age indeterminate. Clinically-small  vessel stroke.  Recommendations discussed with the NP Delford Field, who has documented them above. Please feel free to call with any questions. --- Milon Dikes, MD [facility] Pager: [phone]  If 7pm to 7am, please call on call as listed on AMION.

## 2018-10-22 NOTE — TOC Initial Note (Signed)
Transition of Care [facility]) - Initial/Assessment Note    Patient Details  Name: Cynthia Hunt MRN: [id number] Date of Birth: [birth year]  Transition of Care [facility]) CM/SW Contact:    Annalee Genta, LCSW Phone Number: 10/22/2018, 10:34 AM  Clinical Narrative: CSW has discussed living situation with patient and patient's family and noted multiple safety concerns at patient's residence. CSW noted concerns in detail in a prior progress note. CSW received consult regarding possible placement for patient that is widely dependent upon completion of patient's medical work up. CSW noted patient will likely have a lack of support returning home and may need additional support at her home that per patient's report her goddaughter will be unable to assist with. Patient was clear about her want to return home and feeling safe to go home. TOC staff will continue to follow to assist discharge planning as appropriate with the patient.                Expected Discharge Plan: (Placement disposition pending) Barriers to Discharge: Family Issues, Continued Medical Work up, Unsafe home situation   Patient Goals and CMS Choice Patient states their goals for this hospitalization and ongoing recovery are:: Patient has discussed her want to return home and "be back to normal." Patient likely stated her want to perform her ADLs as well as she can.      Expected Discharge Plan and Services Expected Discharge Plan: (Placement disposition pending)       Living arrangements for the past 2 months: Single Family Home                                      Prior Living Arrangements/Services Living arrangements for the past 2 months: Single Family Home Lives with:: Other (Comment)(Goddaughter) Patient language and need for interpreter reviewed:: No Do you feel safe going back to the place where you live?: Yes      Need for Family Participation in Patient Care: No (Comment) Care giver support system in  place?: No (comment)   Criminal Activity/Legal Involvement Pertinent to Current Situation/Hospitalization: No - Comment as needed  Activities of Daily Living      Permission Sought/Granted Permission sought to share information with : Family Supports Permission granted to share information with : Yes, Verbal Permission Granted  Share Information with NAME: Cresenciano Lick (relative), [phone]. Melynda Ripple [phone]     Permission granted to share info w Relationship: Family  Permission granted to share info w Contact Information: Cresenciano Lick (relative), [phone]. Melynda Ripple [phone]  Emotional Assessment Appearance:: Appears stated age Attitude/Demeanor/Rapport: Engaged, Charismatic, Self-Confident Affect (typically observed): Accepting, Adaptable, Appropriate Orientation: : Oriented to Place, Oriented to Self, Oriented to  Time, Oriented to Situation Alcohol / Substance Use: Not Applicable Psych Involvement: No (comment)  Admission diagnosis:  stroke sx Patient Active Problem List   Diagnosis Date Noted  . CVA (cerebral vascular accident) (HCC) 10/22/2018  . Essential hypertension 10/22/2018  . Dyslipidemia 10/22/2018  . Uncontrolled type 2 diabetes mellitus without complication, with long-term current use of insulin (HCC) 02/13/2016   PCP:  Darrow Bussing, MD Pharmacy:   [facility] [phone] [address], [place] - [phone] AT [facility] [address] [place] [address] Phone: [phone] Fax: [phone]     Social Determinants of Health (SDOH) Interventions    Readmission Risk Interventions No  flowsheet data found.

## 2018-10-23 ENCOUNTER — Other Ambulatory Visit: Payer: Self-pay

## 2018-10-23 DIAGNOSIS — I639 Cerebral infarction, unspecified: Secondary | ICD-10-CM

## 2018-10-23 DIAGNOSIS — R931 Abnormal findings on diagnostic imaging of heart and coronary circulation: Secondary | ICD-10-CM

## 2018-10-23 LAB — HEMOGLOBIN A1C
Hgb A1c MFr Bld: 9 % — ABNORMAL HIGH (ref 4.8–5.6)
Mean Plasma Glucose: 211.6 mg/dL

## 2018-10-23 LAB — GLUCOSE, CAPILLARY: Glucose-Capillary: 192 mg/dL — ABNORMAL HIGH (ref 70–99)

## 2018-10-23 LAB — LIPID PANEL
Cholesterol: 136 mg/dL (ref 0–200)
HDL: 41 mg/dL (ref >40)
LDL Cholesterol: 81 mg/dL (ref 0–99)
Total CHOL/HDL Ratio: 3.3 RATIO
Triglycerides: 71 mg/dL (ref <150)
VLDL: 14 mg/dL (ref 0–40)

## 2018-10-23 LAB — SEDIMENTATION RATE: Sed Rate: 55 mm/hr — ABNORMAL HIGH (ref 0–22)

## 2018-10-23 MED ORDER — ASPIRIN EC 81 MG PO TBEC
81.0000 mg | DELAYED_RELEASE_TABLET | Freq: Every day | ORAL | Status: DC
  Administered 2018-10-24 – 2018-10-25 (×2): 81 mg via ORAL
  Filled 2018-10-23 (×2): qty 1

## 2018-10-23 MED ORDER — CLOPIDOGREL BISULFATE 75 MG PO TABS
75.0000 mg | ORAL_TABLET | Freq: Every day | ORAL | Status: DC
  Administered 2018-10-23 – 2018-10-25 (×3): 75 mg via ORAL
  Filled 2018-10-23 (×3): qty 1

## 2018-10-23 NOTE — Consult Note (Signed)
Cardiology Consultation:   Patient ID: Cynthia Hunt MRN: [id number]; DOB: [birth year]  Admit date: 10/22/2018 Date of Consult: 10/23/2018  Primary Care Provider: Lujean Amel, MD Primary Cardiologist: No primary care provider on file. [place] Girolamo Lortie Primary Electrophysiologist:  None    Patient Profile:   Cynthia Hunt is a 73 y.o. female with a hx of stroke who is being seen today for the evaluation of abnormal ECHO, aortic valve at the request of Dr. Leonie Man.  History of Present Illness:   Ms. Kho 73 year old with acute R MCA infarct. Has DM, HTN, HL.  Had a left sided hemiparesis on Friday.  Dropped plate.  Severe symptoms.  Prior to that, she was not feeling any fevers, night sweats, weight loss to her recollection.  LDL 81, hemoglobin A1c 9.  Past Medical History:  Diagnosis Date   Depression    Diabetes mellitus without complication ([place])    Hyperlipidemia    Hypertension     Past Surgical History:  Procedure Laterality Date   UTERINE FIBROID SURGERY       Home Medications:  Prior to Admission medications   Medication Sig Start Date End Date Taking? Authorizing Provider  acetaminophen (TYLENOL) 500 MG tablet Take 1,000 mg by mouth daily.   Yes [provider]  ALPRAZolam Duanne Moron) 0.5 MG tablet Take 0.5 mg by mouth daily as needed (anxiety while driving).  12/26/15  Yes [provider]  Biotin w/ Vitamins C & E (HAIR SKIN & NAILS GUMMIES PO) Take 1 tablet by mouth daily.   Yes [provider]  diphenhydrAMINE (BENADRYL) 25 MG tablet Take 25 mg by mouth 3 (three) times daily.    Yes [provider]  fluticasone (FLONASE) 50 MCG/ACT nasal spray Place 1 spray into both nostrils daily as needed for allergies or rhinitis.   Yes [provider]  Insulin Glargine (BASAGLAR KWIKPEN) 100 UNIT/ML SOPN Inject 35 Units into the skin at bedtime.    Yes [provider]  losartan (COZAAR) 100 MG tablet Take 100 mg by  mouth daily.  01/30/16  Yes [provider]  meloxicam (MOBIC) 7.5 MG tablet Take 7.5 mg by mouth daily as needed for pain.  08/28/18  Yes [provider]  metFORMIN (GLUCOPHAGE) 1000 MG tablet Take 1,000 mg by mouth 2 (two) times daily with a meal.  01/12/16  Yes [provider]  methocarbamol (ROBAXIN) 500 MG tablet Take 500 mg by mouth 2 (two) times daily as needed for muscle spasms. Script states "not meant for daily use" 02/02/16  Yes [provider]  Multiple Vitamin (MULTIVITAMIN WITH MINERALS) TABS tablet Take 1 tablet by mouth daily.   Yes [provider]  OVER THE COUNTER MEDICATION Apply 1 application topically daily. Over the counter pain cream "Hemma" sp?   Yes [provider]  simvastatin (ZOCOR) 40 MG tablet Take 40 mg by mouth daily.  01/27/16  Yes [provider]  traZODone (DESYREL) 100 MG tablet Take 100 mg by mouth at bedtime.  01/03/16  Yes [provider]  Alcohol Swabs (ALCOHOL PREP) 70 % PADS  02/11/16   [provider]  Blood Glucose Calibration (FREESTYLE CONTROL SOLUTION) LIQD  02/11/16   [provider]  FREESTYLE LITE test strip  02/11/16   [provider]  Lancets (FREESTYLE) lancets  02/11/16   [provider]    Inpatient Medications: Scheduled Meds:   stroke: mapping our early stages of recovery book   Does not  apply Once   aspirin  300 mg Rectal Daily   Or   aspirin  325 mg Oral Daily   atorvastatin  40 mg Oral q1800   enoxaparin (LOVENOX) injection  40 mg Subcutaneous Q24H   insulin aspart  0-15 Units Subcutaneous TID WC   insulin aspart  0-5 Units Subcutaneous QHS   insulin glargine  30 Units Subcutaneous QHS   methocarbamol  500 mg Oral Daily   traZODone  100 mg Oral QHS   Continuous Infusions:  sodium chloride 50 mL/hr at 10/22/18 1115   PRN Meds: acetaminophen **OR** acetaminophen (TYLENOL) oral liquid 160 mg/5 mL **OR** acetaminophen,  ALPRAZolam, senna-docusate  Allergies:    Allergies  Allergen Reactions   Penicillins Other (See Comments)    Childhood Allergy Did it involve swelling of the face/tongue/throat, SOB, or low BP? Unknown Did it involve sudden or severe rash/hives, skin peeling, or any reaction on the inside of your mouth or nose? Unknown Did you need to seek medical attention at a hospital or doctor's office? Unknown When did it last happen?childhood If all above answers are "NO", may proceed with cephalosporin use.     Social History:   Social History   Socioeconomic History   Marital status: Single    Spouse name: Not on file   Number of children: Not on file   Years of education: Not on file   Highest education level: Not on file  Occupational History   Occupation: retired  Scientist, product/process development strain: Not on file   Food insecurity:    Worry: Not on file    Inability: Not on file   Transportation needs:    Medical: Not on file    Non-medical: Not on file  Tobacco Use   Smoking status: Former Smoker    Last attempt to quit: 03/18/2006    Years since quitting: 12.6   Smokeless tobacco: Never Used  Substance and Sexual Activity   Alcohol use: Yes    Comment: occasionally when out to eat   Drug use: Not Currently    Types: Marijuana    Comment: pt reports using years ago   Sexual activity: Not on file  Lifestyle   Physical activity:    Days per week: Not on file    Minutes per session: Not on file   Stress: Not on file  Relationships   Social connections:    Talks on phone: Not on file    Gets together: Not on file    Attends religious service: Not on file    Active member of club or organization: Not on file    Attends meetings of clubs or organizations: Not on file    Relationship status: Not on file   Intimate partner violence:    Fear of current or ex partner: Not on file    Emotionally abused: Not on file    Physically abused: Not on  file    Forced sexual activity: Not on file  Other Topics Concern   Not on file  Social History Narrative   Not on file    Family History:    Family History  Problem Relation Age of Onset   Stroke Mother      ROS:  Please see the history of present illness.   All other ROS reviewed and negative.     Physical Exam/Data:   Vitals:   10/22/18 2349 10/23/18 0356 10/23/18 0729 10/23/18 1109  BP: (!) 177/83 (!) 145/70 Marland Kitchen)  169/80 (!) 158/76  Pulse: 80 74 77 77  Resp: 20 20 15 16   Temp: 98.6 F (37 C) 98.9 F (37.2 C) 98.2 F (36.8 C) 98.5 F (36.9 C)  TempSrc: Oral Oral Oral Oral  SpO2: 97% 96% 96% 94%  Weight:      Height:        Intake/Output Summary (Last 24 hours) at 10/23/2018 1403 Last data filed at 10/22/2018 1500 Gross per 24 hour  Intake 100 ml  Output --  Net 100 ml   Last 3 Weights 10/22/2018 02/13/2016 03/18/2014  Weight (lbs) 205 lb 215 lb 221 lb 3.2 oz  Weight (kg) 92.987 kg 97.523 kg 100.336 kg     Body mass index is 32.11 kg/m.  General:  Well nourished, well developed, in no acute distress HEENT: normal Lymph: no adenopathy Neck: no JVD Endocrine:  No thryomegaly Vascular: No carotid bruits Cardiac:  normal S1, S2; RRR; no murmur  Lungs:  clear to auscultation bilaterally, no wheezing, rhonchi or rales  Abd: soft, nontender, no hepatomegaly  Ext: no edema Musculoskeletal:  No deformities, BUE and BLE strength normal and equal Skin: warm and dry  Neuro: Improved left-sided weakness psych:  Normal affect   EKG:  The EKG was personally reviewed and demonstrates: NSR 101, NSSTW changes   Telemetry:  Telemetry was personally reviewed and demonstrates:  NSR  Relevant CV Studies: ECHO 10/22/18:   1. The aortic valve is abnormal. Aortic valve regurgitation is trivial by color flow Doppler. There is leaflet thickening along the coaptation points centrally. Cannot exclude aortic valve vegetations. Differential diagnosis includes nonbacterial   thrombotic endocarditis, infective endocarditis, papillary fibroelastoma, or valve degeneration. Consider cardiology consultation with consideration for further imaging if clinically indicated in the setting of stroke.  2. The left ventricle has normal systolic function, with an ejection fraction of 55-60%. The cavity size was normal. There is mildly increased left ventricular wall thickness.  3. The right ventricle has normal systolc function. The cavity was normal. There is no increase in right ventricular wall thickness.  4. Mild thickening of the mitral valve leaflet.  5. The inferior vena cava was normal in size with <50% respiratory variability.  FINDINGS  Left Ventricle: The left ventricle has normal systolic function, with an ejection fraction of 55-60%. The cavity size was normal. There is mildly increased left ventricular wall thickness.    Right Ventricle: The right ventricle has normal systolic function. The cavity was normal. There is no increase in right ventricular wall thickness.  Left Atrium: Left atrial size was normal in size.  Right Atrium: Right atrial size was normal in size. Right atrial pressure is estimated at 3 mmHg.  Interatrial Septum: No atrial level shunt detected by color flow Doppler.  Pericardium: There is no evidence of pericardial effusion.  Mitral Valve: Mild thickening of the mitral valve leaflet. Mitral valve regurgitation is not visualized by color flow Doppler.  Tricuspid Valve: Tricuspid valve regurgitation was not visualized by color flow Doppler.  Aortic Valve: The aortic valve is abnormal Aortic valve regurgitation is trivial by color flow Doppler.  Pulmonic Valve: Pulmonic valve regurgitation is trivial by color flow Doppler.  Venous: The inferior vena cava is normal in size with less than 50% respiratory variability.   Laboratory Data:  Chemistry Recent Labs  Lab 10/22/18 0755 10/22/18 0759  NA 140  --   K 3.4*  --    CL 100  --   CO2 22  --   GLUCOSE  175*  --   BUN 18  --   CREATININE 1.34* 1.10*  CALCIUM 8.2*  --   GFRNONAA 39*  --   GFRAA 46*  --   ANIONGAP 18*  --     Recent Labs  Lab 10/22/18 0755  PROT 7.7  ALBUMIN 3.5  AST 22  ALT 20  ALKPHOS 49  BILITOT 0.8   Hematology Recent Labs  Lab 10/22/18 0755  WBC 7.4  RBC 3.71*  HGB 10.5*  HCT 35.0*  MCV 94.3  MCH 28.3  MCHC 30.0  RDW 13.6  PLT 238   Cardiac EnzymesNo results for input(s): TROPONINI in the last 168 hours. No results for input(s): TROPIPOC in the last 168 hours.  BNPNo results for input(s): BNP, PROBNP in the last 168 hours.  DDimer No results for input(s): DDIMER in the last 168 hours.  Radiology/Studies:  Ct Head Wo Contrast  Result Date: 10/22/2018 CLINICAL DATA:  Increasing LEFT-sided weakness. Symptoms began 2 days ago. EXAM: CT HEAD WITHOUT CONTRAST TECHNIQUE: Contiguous axial images were obtained from the base of the skull through the vertex without intravenous contrast. COMPARISON:  No priors for comparison. FINDINGS: Brain: Age-indeterminate hypodensity of RIGHT basal ganglia, between the caudate and lentiform nucleus, could represent developing acute infarction. Similarly, asymmetric hypoattenuation of the RIGHT centrum semiovale, could be acute or chronic. No definite acute cortical hypodensities. No hemorrhage, mass lesion, hydrocephalus, or extra-axial fluid. Mild cerebral and cerebellar atrophy. Generalized white matter hypoattenuation could represent small vessel disease. Vascular: Calcification of the cavernous internal carotid arteries consistent with cerebrovascular atherosclerotic disease. No signs of intracranial large vessel occlusion. Skull: Intact. Sinuses/Orbits: No acute sinus findings. Conjugate gaze to the RIGHT. Other: None. IMPRESSION: 1. Age-indeterminate RIGHT basal ganglia and RIGHT centrum semiovale hypodensities could represent acute or chronic infarction. 2. If no contraindications, MRI  of the brain without contrast recommended for further evaluation. Electronically Signed   By: Staci Righter M.D.   On: 10/22/2018 08:28   Mr Jodene Nam Head Wo Contrast  Result Date: 10/22/2018 CLINICAL DATA:  Stroke-like symptoms. Slipped off bed, and could not get off floor. Reportedly, LEFT hemiparesis for 2 days, but patient did not want to come to hospital. Stroke risk factors include uncontrolled type 2 diabetes mellitus, essential hypertension and dyslipidemia. EXAM: MR HEAD WITHOUT CONTRAST MR CIRCLE OF WILLIS WITHOUT CONTRAST MRA OF THE NECK WITHOUT AND WITH CONTRAST TECHNIQUE: Multiplanar, multiecho pulse sequences of the brain, circle of Willis and surrounding structures were obtained without intravenous contrast. Angiographic images of the neck were obtained using MRA technique without and with intravenous contrast. CONTRAST:  Gadavist 10 mL. COMPARISON:  Noncontrast CT head earlier today. FINDINGS: MR HEAD FINDINGS Brain: Multifocal areas of restricted diffusion, corresponding low ADC, affect the RIGHT frontal cortex, posterior frontal cortex, and subcortical white matter consistent with acute nonhemorrhagic infarcts. One area of acute infarction, roughly 8 mm in diameter, corresponds with the abnormality noted on CT earlier today. The second area questioned on earlier CT, in the RIGHT basal ganglia, represents an area of chronic infarction. No mass lesion, hydrocephalus, or extra-axial fluid. Generalized atrophy. Moderately advanced T2 and FLAIR hyperintensities throughout the white matter, likely small vessel disease. Vascular: Reported separately. Skull and upper cervical spine: No acute findings. Partial empty sella. Sinuses/Orbits: Mucosal thickening.  Negative orbits. Other: None. MR CIRCLE OF WILLIS FINDINGS RIGHT ICA: Nonstenotic irregularity in its cavernous segment. Otherwise widely patent. M1 MCA widely patent. A1 ACA widely patent. High-grade 75% or greater stenosis of the superior  division M2  MCA on the RIGHT. See series 5 image 122. This is likely contributory to the observed pattern of infarction. LEFT ICA: 50% cavernous stenosis. 50-75% stenosis at the junction of the cavernous and supraclinoid ICA. LEFT M1 MCA widely patent. 50% stenosis origin LEFT A1 ACA. Nonstenotic irregularity LEFT M2 vessels. Distal anterior cerebral arteries are mildly irregular but patent. Basilar artery mildly irregular but patent. RIGHT vertebral dominant contributor. LEFT vertebral diminutive but patent. Fetal origin LEFT PCA. RIGHT PCA widely patent. No cerebellar branch occlusion. No saccular aneurysm. MRA NECK FINDINGS Dolichoectatic great vessels, but no proximal stenosis. Nonstenotic irregularity RIGHT carotid bifurcation. Nonstenotic irregularity LEFT carotid bifurcation. Both vertebral arteries are patent without significant ostial stenosis, RIGHT dominant. IMPRESSION: Multifocal areas of acute infarction, RIGHT MCA territory, affect the RIGHT frontal and posterior frontal cortex and subcortical white matter. High-grade stenosis of the RIGHT M2 MCA superior division is likely contributory to the observed pattern of ischemia. 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 No intracranial hemorrhage or mass. 50-75% LEFT ICA cavernous/supraclinoid stenosis; otherwise, no proximal flow-limiting stenosis of the carotid or basilar arteries. Extracranial vasculature is dolichoectatic without flow-limiting stenosis, dissection, or occlusion. Minor atheromatous change both carotid bifurcations. Electronically Signed   By: Staci Righter M.D.   On: 10/22/2018 14:22   Mr Angiogram Neck W Or Wo Contrast  Result Date: 10/22/2018 CLINICAL DATA:  Stroke-like symptoms. Slipped off bed, and could not get off floor. Reportedly, LEFT hemiparesis for 2 days, but patient did not want to come to hospital. Stroke risk factors include uncontrolled type 2 diabetes mellitus, essential  hypertension and dyslipidemia. EXAM: MR HEAD WITHOUT CONTRAST MR CIRCLE OF WILLIS WITHOUT CONTRAST MRA OF THE NECK WITHOUT AND WITH CONTRAST TECHNIQUE: Multiplanar, multiecho pulse sequences of the brain, circle of Willis and surrounding structures were obtained without intravenous contrast. Angiographic images of the neck were obtained using MRA technique without and with intravenous contrast. CONTRAST:  Gadavist 10 mL. COMPARISON:  Noncontrast CT head earlier today. FINDINGS: MR HEAD FINDINGS Brain: Multifocal areas of restricted diffusion, corresponding low ADC, affect the RIGHT frontal cortex, posterior frontal cortex, and subcortical white matter consistent with acute nonhemorrhagic infarcts. One area of acute infarction, roughly 8 mm in diameter, corresponds with the abnormality noted on CT earlier today. The second area questioned on earlier CT, in the RIGHT basal ganglia, represents an area of chronic infarction. No mass lesion, hydrocephalus, or extra-axial fluid. Generalized atrophy. Moderately advanced T2 and FLAIR hyperintensities throughout the white matter, likely small vessel disease. Vascular: Reported separately. Skull and upper cervical spine: No acute findings. Partial empty sella. Sinuses/Orbits: Mucosal thickening.  Negative orbits. Other: None. MR CIRCLE OF WILLIS FINDINGS RIGHT ICA: Nonstenotic irregularity in its cavernous segment. Otherwise widely patent. M1 MCA widely patent. A1 ACA widely patent. High-grade 75% or greater stenosis of the superior division M2 MCA on the RIGHT. See series 5 image 122. This is likely contributory to the observed pattern of infarction. LEFT ICA: 50% cavernous stenosis. 50-75% stenosis at the junction of the cavernous and supraclinoid ICA. LEFT M1 MCA widely patent. 50% stenosis origin LEFT A1 ACA. Nonstenotic irregularity LEFT M2 vessels. Distal anterior cerebral arteries are mildly irregular but patent. Basilar artery mildly irregular but patent. RIGHT  vertebral dominant contributor. LEFT vertebral diminutive but patent. Fetal origin LEFT PCA. RIGHT PCA widely patent. No cerebellar branch occlusion. No saccular aneurysm. MRA NECK FINDINGS Dolichoectatic great vessels, but no proximal stenosis. Nonstenotic irregularity RIGHT carotid bifurcation.  Nonstenotic irregularity LEFT carotid bifurcation. Both vertebral arteries are patent without significant ostial stenosis, RIGHT dominant. IMPRESSION: Multifocal areas of acute infarction, RIGHT MCA territory, affect the RIGHT frontal and posterior frontal cortex and subcortical white matter. High-grade stenosis of the RIGHT M2 MCA superior division is likely contributory to the observed pattern of ischemia. 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 No intracranial hemorrhage or mass. 50-75% LEFT ICA cavernous/supraclinoid stenosis; otherwise, no proximal flow-limiting stenosis of the carotid or basilar arteries. Extracranial vasculature is dolichoectatic without flow-limiting stenosis, dissection, or occlusion. Minor atheromatous change both carotid bifurcations. Electronically Signed   By: Staci Righter M.D.   On: 10/22/2018 14:22   Mr Brain Wo Contrast  Result Date: 10/22/2018 CLINICAL DATA:  Stroke-like symptoms. Slipped off bed, and could not get off floor. Reportedly, LEFT hemiparesis for 2 days, but patient did not want to come to hospital. Stroke risk factors include uncontrolled type 2 diabetes mellitus, essential hypertension and dyslipidemia. EXAM: MR HEAD WITHOUT CONTRAST MR CIRCLE OF WILLIS WITHOUT CONTRAST MRA OF THE NECK WITHOUT AND WITH CONTRAST TECHNIQUE: Multiplanar, multiecho pulse sequences of the brain, circle of Willis and surrounding structures were obtained without intravenous contrast. Angiographic images of the neck were obtained using MRA technique without and with intravenous contrast. CONTRAST:  Gadavist 10 mL. COMPARISON:  Noncontrast CT head  earlier today. FINDINGS: MR HEAD FINDINGS Brain: Multifocal areas of restricted diffusion, corresponding low ADC, affect the RIGHT frontal cortex, posterior frontal cortex, and subcortical white matter consistent with acute nonhemorrhagic infarcts. One area of acute infarction, roughly 8 mm in diameter, corresponds with the abnormality noted on CT earlier today. The second area questioned on earlier CT, in the RIGHT basal ganglia, represents an area of chronic infarction. No mass lesion, hydrocephalus, or extra-axial fluid. Generalized atrophy. Moderately advanced T2 and FLAIR hyperintensities throughout the white matter, likely small vessel disease. Vascular: Reported separately. Skull and upper cervical spine: No acute findings. Partial empty sella. Sinuses/Orbits: Mucosal thickening.  Negative orbits. Other: None. MR CIRCLE OF WILLIS FINDINGS RIGHT ICA: Nonstenotic irregularity in its cavernous segment. Otherwise widely patent. M1 MCA widely patent. A1 ACA widely patent. High-grade 75% or greater stenosis of the superior division M2 MCA on the RIGHT. See series 5 image 122. This is likely contributory to the observed pattern of infarction. LEFT ICA: 50% cavernous stenosis. 50-75% stenosis at the junction of the cavernous and supraclinoid ICA. LEFT M1 MCA widely patent. 50% stenosis origin LEFT A1 ACA. Nonstenotic irregularity LEFT M2 vessels. Distal anterior cerebral arteries are mildly irregular but patent. Basilar artery mildly irregular but patent. RIGHT vertebral dominant contributor. LEFT vertebral diminutive but patent. Fetal origin LEFT PCA. RIGHT PCA widely patent. No cerebellar branch occlusion. No saccular aneurysm. MRA NECK FINDINGS Dolichoectatic great vessels, but no proximal stenosis. Nonstenotic irregularity RIGHT carotid bifurcation. Nonstenotic irregularity LEFT carotid bifurcation. Both vertebral arteries are patent without significant ostial stenosis, RIGHT dominant. IMPRESSION: Multifocal  areas of acute infarction, RIGHT MCA territory, affect the RIGHT frontal and posterior frontal cortex and subcortical white matter. High-grade stenosis of the RIGHT M2 MCA superior division is likely contributory to the observed pattern of ischemia. 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 No intracranial hemorrhage or mass. 50-75% LEFT ICA cavernous/supraclinoid stenosis; otherwise, no proximal flow-limiting stenosis of the carotid or basilar arteries. Extracranial vasculature is dolichoectatic without flow-limiting stenosis, dissection, or occlusion. Minor atheromatous change both carotid bifurcations. Electronically Signed   By: Jenny Reichmann  Alfonse Flavors M.D.   On: 10/22/2018 14:22   Vas US Carotid (at [facility] Only)  Result Date: 10/23/2018 Carotid Arterial Duplex Study Indications: CVA. Performing Technologist: Abram Sander RVS  Examination Guidelines: A complete evaluation includes B-mode imaging, spectral Doppler, color Doppler, and power Doppler as needed of all accessible portions of each vessel. Bilateral testing is considered an integral part of a complete examination. Limited examinations for reoccurring indications may be performed as noted.  Right Carotid Findings: +----------+--------+--------+--------+------------+--------+             PSV cm/s EDV cm/s Stenosis Describe     Comments  +----------+--------+--------+--------+------------+--------+  CCA Prox   45                         heterogenous           +----------+--------+--------+--------+------------+--------+  CCA Distal 61       14                heterogenous           +----------+--------+--------+--------+------------+--------+  ICA Prox   75       25       1-39%    heterogenous           +----------+--------+--------+--------+------------+--------+  ICA Distal 59       19                                       +----------+--------+--------+--------+------------+--------+  ECA        113      6                                         +----------+--------+--------+--------+------------+--------+ +----------+--------+-------+--------+-------------------+             PSV cm/s EDV cms Describe Arm Pressure (mmHG)  +----------+--------+-------+--------+-------------------+  Subclavian 86                                             +----------+--------+-------+--------+-------------------+ +---------+--------+--+--------+--+---------+  Vertebral PSV cm/s 53 EDV cm/s 16 Antegrade  +---------+--------+--+--------+--+---------+  Left Carotid Findings: +----------+--------+--------+--------+------------+--------+             PSV cm/s EDV cm/s Stenosis Describe     Comments  +----------+--------+--------+--------+------------+--------+  CCA Prox   111      17                heterogenous           +----------+--------+--------+--------+------------+--------+  CCA Distal 66       14                heterogenous           +----------+--------+--------+--------+------------+--------+  ICA Prox   86       27       1-39%    heterogenous           +----------+--------+--------+--------+------------+--------+  ICA Distal 56       18                                       +----------+--------+--------+--------+------------+--------+  ECA        85       7                                        +----------+--------+--------+--------+------------+--------+ +----------+--------+--------+--------+-------------------+  Subclavian PSV cm/s EDV cm/s Describe Arm Pressure (mmHG)  +----------+--------+--------+--------+-------------------+             93                                              +----------+--------+--------+--------+-------------------+ +---------+--------+--+--------+--+---------+  Vertebral PSV cm/s 32 EDV cm/s 10 Antegrade  +---------+--------+--+--------+--+---------+  Summary:   *See table(s) above for measurements and observations.  Electronically signed by Antony Contras MD on 10/23/2018 at 1:47:46 PM.    Final      Assessment and Plan:   Acute stroke with abnormal ECHO (?AV or MV vegetation or other mass) - Plan on transesophageal echocardiogram on 10/24/2018, tomorrow.  Risks and benefits have been explained including esophageal damage.  She is willing to proceed. -I personally reviewed echocardiogram and the aortic valve does appear thickened, shaggy, perhaps there is mobile material on this valve.  In 1 four-chamber view I was also concerned about the possibility of something along the anterior mitral leaflet but this may have been artifactual.  We should definitely do a TEE. -I will check blood cultures and ESR as well as C-reactive protein.  She is afebrile.  Discussed with Dr. Eliseo Squires - She denies any constellation of symptoms that would be indicative of longstanding infective process, i.e. endocarditis.      For questions or updates, please contact [place] Please consult [email or web address] for contact info under     Signed, Candee Furbish, MD  10/23/2018 2:03 PM

## 2018-10-23 NOTE — Evaluation (Signed)
Speech Language Pathology Evaluation Patient Details Name: Cynthia Hunt MRN: [id number] DOB: 05-Mar-1946 Today's Date: 10/23/2018 Time: 2979-8921 SLP Time Calculation (min) (ACUTE ONLY): 30 min  Problem List:  Patient Active Problem List   Diagnosis Date Noted  . CVA (cerebral vascular accident) (HCC) 10/22/2018  . Essential hypertension 10/22/2018  . Dyslipidemia 10/22/2018  . Uncontrolled type 2 diabetes mellitus without complication, with long-term current use of insulin (HCC) 02/13/2016   Past Medical History:  Past Medical History:  Diagnosis Date  . Depression   . Diabetes mellitus without complication (HCC)   . Hyperlipidemia   . Hypertension    Past Surgical History:  Past Surgical History:  Procedure Laterality Date  . UTERINE FIBROID SURGERY     HPI:  Cynthia Hunt is a 73 y.o. female with medical history significant of HTN; HLD: DM; and depression presenting with stroke-like symptoms. MRI of brain revealed multifocal areas of acute infarction, RIGHT MCA territory, affect the RIGHT frontal and posterior frontal cortex and subcortical white   Assessment / Plan / Recommendation Clinical Impression  Pt presents with suspected acute cognitive deficits post right MCA CVA.  MOCA administered 18/30 indicative of mild to moderate cognitive deficits. Pt oriented x3, mild temporal disorientation exhibited with day of month and day of week.  Pt with decreased noval recall, and decreased executive function skills. Expressive and receptive language skills appeared intact as well as motor speech skills. PLOF pt reports complete independence with all ADLS. Pt states her goddaughter currently lives with her and is available 24 hours a day.  SLP to follow up for cognitive deficits during acute stay.     SLP Assessment  SLP Recommendation/Assessment: Patient needs continued Speech Lanaguage Pathology Services SLP Visit Diagnosis: Cognitive communication deficit (R41.841)    Follow  Up Recommendations  24 hour supervision/assistance; TBD OT/PT recommendations   Frequency and Duration min 1 x/week  1 week      SLP Evaluation Cognition  Overall Cognitive Status: Impaired/Different from baseline Arousal/Alertness: Awake/alert Orientation Level: Oriented to person;Oriented to place;Oriented to situation(disoriented to temporal orientation slightly ) Attention: Focused;Sustained Focused Attention: Appears intact Sustained Attention: Appears intact Memory: Impaired Memory Impairment: Decreased recall of new information;Decreased short term memory Decreased Short Term Memory: Verbal complex;Functional complex Problem Solving: Impaired Executive Function: Organizing;Self Monitoring Organizing: Impaired Self Monitoring: Impaired       Comprehension  Auditory Comprehension Overall Auditory Comprehension: Appears within functional limits for tasks assessed Visual Recognition/Discrimination Discrimination: Within Function Limits    Expression Expression Primary Mode of Expression: Verbal Verbal Expression Overall Verbal Expression: Appears within functional limits for tasks assessed Written Expression Dominant Hand: Right   Oral / Motor  Motor Speech Overall Motor Speech: Appears within functional limits for tasks assessed   GO                    Marajade Lei E Anasophia Pecor MA, CCC-SLP  Acute Rehab Speech Language Pathologist  10/23/2018, 9:58 AM

## 2018-10-23 NOTE — Patient Outreach (Signed)
  [facility]) Care Management Chronic Special Needs Program    10/23/2018  Name: Cynthia Hunt, DOB: 02/20/1946  MRN: [id number]   [name] is enrolled in a chronic special needs plan for Diabetes. Client was admitted to [facility] on 10/22/18 with dx of CVA Interdisciplinary care plan sent to [facility] hospital for admission   Dudley Major RN, [facility], CDE Chronic Care Management Coordinator [facility] Care Management [phone]

## 2018-10-23 NOTE — Progress Notes (Signed)
Initial Nutrition Assessment   RD working remotely.  DOCUMENTATION CODES:   Obesity unspecified  INTERVENTION:  Continue heart healthy diet.   RD to continue to monitor.   NUTRITION DIAGNOSIS:   Increased nutrient needs related to acute illness as evidenced by estimated needs.  GOAL:   Patient will meet greater than or equal to 90% of their needs  MONITOR:   PO intake, Labs, Weight trends, Skin  REASON FOR ASSESSMENT:   Consult (stroke)  ASSESSMENT:   73 y.o. female  with medical history significant of HTN; HLD: DM; and depression presenting with stroke-like symptoms. MRI/MRA: Multifocal areas of acute infarction, RIGHT MCA territory,  RD unable to obtain nutrition history from attempt at contacting pt via inpatient room phone. Meal completion 90%. Continue heart healthy diet. Unable to complete Nutrition-Focused physical exam at this time. Labs and medications reviewed. RD to continue to monitor.   Diet Order:   Diet Order            Diet Heart Room service appropriate? Yes; Fluid consistency: Thin  Diet effective now              EDUCATION NEEDS:   Not appropriate for education at this time  Skin:  Skin Assessment: Reviewed RN Assessment  Last BM:  Unknown  Height:   Ht Readings from Last 1 Encounters:  10/22/18 5\' 7"  (1.702 m)    Weight:   Wt Readings from Last 1 Encounters:  10/22/18 93 kg    Ideal Body Weight:  61.36 kg  BMI:  Body mass index is 32.11 kg/m.  Estimated Nutritional Needs:   Kcal:  1800-2000  Protein:  85-95 grams  Fluid:  1.8 - 2 L/day    Roslyn Smiling, MS, RD, LDN Pager # [phone] After hours/ weekend pager # [phone]

## 2018-10-23 NOTE — Evaluation (Addendum)
Occupational Therapy Evaluation Patient Details Name: Cynthia Hunt MRN: [id number] DOB: 12-Feb-1946 Today's Date: 10/23/2018    History of Present Illness Cynthia Hunt is an 73 y.o. female  with medical history significant of HTN; HLD: DM; and depression presenting with stroke-like symptoms. She slipped off the bed and couldn't get off the floor.CT-CT with R basal ganglia and centrum semiovale hypodensities c/w acute/subacute CVA   Clinical Impression   This 73 yo female admitted with above presents to acute OT with decreased balance anddecreased coordination of  LUE both affecting her safety and independence with her basic and IADLS. He will benefit from acute OT with follow up on CIR to get back to a Mod I to independent level.     Follow Up Recommendations  CIR;Supervision/Assistance - 24 hour    Equipment Recommendations  3 in 1 bedside commode       Precautions / Restrictions Precautions Precautions: Fall      Mobility Bed Mobility Overal bed mobility: Needs Assistance Bed Mobility: Supine to Sit     Supine to sit: Supervision     General bed mobility comments: increased time and effort; use of bed rails  Transfers Overall transfer level: Needs assistance Equipment used: Rolling walker (2 wheeled) Transfers: Sit to/from Stand Sit to Stand: Min assist         General transfer comment: to power up from seated at EOB    Balance Overall balance assessment: Needs assistance Sitting-balance support: No upper extremity supported;Feet supported Sitting balance-Leahy Scale: Good Sitting balance - Comments: donned socks at EOB   Standing balance support: No upper extremity supported;During functional activity Standing balance-Leahy Scale: Fair Standing balance comment: min guard A                           ADL either performed or assessed with clinical judgement   ADL Overall ADL's : Needs assistance/impaired Eating/Feeding: Modified  independent;Sitting Eating/Feeding Details (indicate cue type and reason): increased time Grooming: Wash/dry hands;Minimal assistance;Standing Grooming Details (indicate cue type and reason): at sink; increased time Upper Body Bathing: Minimal assistance;Sitting Upper Body Bathing Details (indicate cue type and reason): increased time Lower Body Bathing: Minimal assistance;Sit to/from stand Lower Body Bathing Details (indicate cue type and reason): increased time Upper Body Dressing : Minimal assistance;Sitting Upper Body Dressing Details (indicate cue type and reason): increased time Lower Body Dressing: Minimal assistance;Sit to/from stand Lower Body Dressing Details (indicate cue type and reason): increased time Toilet Transfer: Minimal assistance;Ambulation;BSC;RW Toilet Transfer Details (indicate cue type and reason): over toilet Toileting- Clothing Manipulation and Hygiene: Minimal assistance;Sit to/from stand               Vision Patient Visual Report: No change from baseline Vision Assessment?: Yes Eye Alignment: Within Functional Limits Ocular Range of Motion: Within Functional Limits Alignment/Gaze Preference: Within Defined Limits Tracking/Visual Pursuits: Able to track stimulus in all quads without difficulty Convergence: Within functional limits Visual Fields: No apparent deficits Additional Comments: pt did have trouble finding the sink in the room as she walked out of the bathroom (sink was on her immediate left)            Pertinent Vitals/Pain Pain Assessment: No/denies pain     Hand Dominance Right   Extremity/Trunk Assessment Upper Extremity Assessment LUE Deficits / Details: WFL for AROM but with decreased coordination LUE Sensation: WNL LUE Coordination: decreased fine motor;decreased gross motor  Communication Communication Communication: No difficulties   Cognition Arousal/Alertness: Awake/alert Behavior During Therapy: WFL for  tasks assessed/performed Overall Cognitive Status: Within Functional Limits for tasks assessed                                                Home Living Family/patient expects to be discharged to:: Private residence Living Arrangements: Other relatives(god-daughter) Available Help at Discharge: Family;Available 24 hours/day Type of Home: House       Home Layout: One level         Bathroom Toilet: Standard     Home Equipment: Cane - single point      Lives With: Family(god-daughter)    Prior Functioning/Environment Level of Independence: Independent with assistive device(s)        Comments: SPC, drives        OT Problem List: Decreased strength;Decreased range of motion;Impaired balance (sitting and/or standing);Impaired UE functional use;Decreased coordination      OT Treatment/Interventions: Self-care/ADL training;DME and/or AE instruction;Patient/family education;Balance training;Therapeutic activities;Therapeutic exercise    OT Goals(Current goals can be found in the care plan section) Acute Rehab OT Goals Patient Stated Goal: improve strength and mobility OT Goal Formulation: With patient Time For Goal Achievement: 11/06/18 Potential to Achieve Goals: Good ADL Goals Pt Will Perform Grooming: Independently;standing Pt Will Perform Upper Body Bathing: Independently;standing Pt Will Perform Lower Body Bathing: Independently;sit to/from stand Pt Will Perform Upper Body Dressing: Independently;sitting Pt Will Perform Lower Body Dressing: Independently;sit to/from stand Pt Will Transfer to Toilet: Independently;ambulating;bedside commode(over toilet) Pt Will Perform Toileting - Clothing Manipulation and hygiene: Independently;sit to/from stand Pt Will Perform Tub/Shower Transfer: Independently;ambulating Pt/caregiver will Perform Home Exercise Program: Increased strength;Left upper extremity;With written HEP provided(FM and GM actvities)  OT  Frequency: Min 3X/week           Co-evaluation PT/OT/SLP Co-Evaluation/Treatment: Yes Reason for Co-Treatment: For patient/therapist safety   OT goals addressed during session: ADL's and self-care;Strengthening/ROM      AM-PAC OT "6 Clicks" Daily Activity     Outcome Measure Help from another person eating meals?: None Help from another person taking care of personal grooming?: A Little Help from another person toileting, which includes using toliet, bedpan, or urinal?: A Little Help from another person bathing (including washing, rinsing, drying)?: A Little Help from another person to put on and taking off regular upper body clothing?: A Little Help from another person to put on and taking off regular lower body clothing?: A Little 6 Click Score: 19   End of Session Equipment Utilized During Treatment: Gait belt;Rolling walker Nurse Communication: (NT)  Activity Tolerance: Patient tolerated treatment well Patient left: in chair;with call bell/phone within reach;with chair alarm set  OT Visit Diagnosis: Unsteadiness on feet (R26.81);Other abnormalities of gait and mobility (R26.89);Muscle weakness (generalized) (M62.81);Hemiplegia and hemiparesis Hemiplegia - Right/Left: Left Hemiplegia - dominant/non-dominant: Non-Dominant Hemiplegia - caused by: Cerebral infarction                Time: 8756-4332 OT Time Calculation (min): 28 min Charges:  OT General Charges $OT Visit: 1 Visit OT Evaluation $OT Eval Moderate Complexity: 1 Mod  Ignacia Palma, OTR/L Acute [employer] Pager [phone] Office [phone]     Cynthia Hunt 10/23/2018, 4:17 PM

## 2018-10-23 NOTE — Evaluation (Signed)
Physical Therapy Evaluation Patient Details Name: Cynthia Hunt MRN: [id number] DOB: 11-Apr-1946 Today's Date: 10/23/2018   History of Present Illness  Cynthia BraySheila M Schiltz is an 73 y.o. female  with medical history significant of HTN; HLD: DM; and depression presenting with stroke-like symptoms. She slipped off the bed and couldn't get off the floor.CT-CT with R basal ganglia and centrum semiovale hypodensities c/w acute/subacute CVA    Clinical Impression  Patient admitted with the above listed diagnosis. Patient reports Mod I with mobility with SPC prior to admission. Patient today requiring Min A for sit to stand at bedside and up to Min A with mobility with required use of RW for stability. PT providing cueing for balance, step length and environment scanning for improved safety with functional mobility. Patient to benefit from CIR level therapies at discharge to progress safe and independent functional mobility. PT to continue to follow.      Follow Up Recommendations CIR    Equipment Recommendations  Other (comment)(defer)    Recommendations for Other Services Rehab consult     Precautions / Restrictions Precautions Precautions: Fall Restrictions Weight Bearing Restrictions: No      Mobility  Bed Mobility Overal bed mobility: Needs Assistance Bed Mobility: Supine to Sit     Supine to sit: Supervision     General bed mobility comments: increased time and effort; use of bed rails  Transfers Overall transfer level: Needs assistance Equipment used: Rolling walker (2 wheeled) Transfers: Sit to/from Stand Sit to Stand: Min assist         General transfer comment: to power up from seated at EOB  Ambulation/Gait Ambulation/Gait assistance: Min guard;Min assist Gait Distance (Feet): 60 Feet Assistive device: Rolling walker (2 wheeled) Gait Pattern/deviations: Step-through pattern;Decreased step length - left;Decreased stance time - left;Trunk flexed Gait velocity:  decreased   General Gait Details: difficulty with scanning environment; L LE with short step lengths  Stairs            Wheelchair Mobility    Modified Rankin (Stroke Patients Only) Modified Rankin (Stroke Patients Only) Pre-Morbid Rankin Score: No symptoms Modified Rankin: Moderately severe disability     Balance Overall balance assessment: Needs assistance Sitting-balance support: No upper extremity supported;Feet supported Sitting balance-Leahy Scale: Good Sitting balance - Comments: donned socks at EOB   Standing balance support: No upper extremity supported;During functional activity Standing balance-Leahy Scale: Fair Standing balance comment: min guard A                             Pertinent Vitals/Pain Pain Assessment: No/denies pain    Home Living Family/patient expects to be discharged to:: Private residence Living Arrangements: Other relatives(god-daughter) Available Help at Discharge: Family;Available 24 hours/day Type of Home: House       Home Layout: One level Home Equipment: Cane - single point      Prior Function Level of Independence: Independent with assistive device(s)(SPC; drives)               Hand Dominance   Dominant Hand: Right    Extremity/Trunk Assessment   Upper Extremity Assessment Upper Extremity Assessment: Defer to OT evaluation LUE Deficits / Details: WFL for AROM but with decreased coordination LUE Sensation: WNL LUE Coordination: decreased fine motor;decreased gross motor    Lower Extremity Assessment Lower Extremity Assessment: LLE deficits/detail LLE Deficits / Details: difficulty with motion against gravity; slight knee buckle with full weight bearing  LLE Sensation: WNL LLE  Coordination: WNL       Communication   Communication: No difficulties  Cognition Arousal/Alertness: Awake/alert Behavior During Therapy: WFL for tasks assessed/performed Overall Cognitive Status: Within Functional  Limits for tasks assessed                                        General Comments      Exercises     Assessment/Plan    PT Assessment Patient needs continued PT services  PT Problem List Decreased strength;Decreased activity tolerance;Decreased balance;Decreased mobility;Decreased knowledge of use of DME;Decreased safety awareness       PT Treatment Interventions DME instruction;Gait training;Stair training;Functional mobility training;Therapeutic activities;Therapeutic exercise;Balance training;Neuromuscular re-education;Patient/family education    PT Goals (Current goals can be found in the Care Plan section)  Acute Rehab PT Goals Patient Stated Goal: improve strength and mobility PT Goal Formulation: With patient Time For Goal Achievement: 11/06/18 Potential to Achieve Goals: Good    Frequency Min 4X/week   Barriers to discharge        Co-evaluation               AM-PAC PT "6 Clicks" Mobility  Outcome Measure Help needed turning from your back to your side while in a flat bed without using bedrails?: A Little Help needed moving from lying on your back to sitting on the side of a flat bed without using bedrails?: A Little Help needed moving to and from a bed to a chair (including a wheelchair)?: A Little Help needed standing up from a chair using your arms (e.g., wheelchair or bedside chair)?: A Little Help needed to walk in hospital room?: A Little Help needed climbing 3-5 steps with a railing? : A Lot 6 Click Score: 17    End of Session Equipment Utilized During Treatment: Gait belt Activity Tolerance: Patient tolerated treatment well Patient left: in chair;with call bell/phone within reach;with chair alarm set Nurse Communication: Mobility status PT Visit Diagnosis: Unsteadiness on feet (R26.81);Other abnormalities of gait and mobility (R26.89);Muscle weakness (generalized) (M62.81)    Time: 9381-0175 PT Time Calculation (min) (ACUTE  ONLY): 29 min   Charges:   PT Evaluation $PT Eval Moderate Complexity: 1 Mod          Kipp Laurence, PT, DPT Supplemental Physical Therapist 10/23/18 1:14 PM Pager: [phone] Office: [phone]

## 2018-10-23 NOTE — Progress Notes (Signed)
Progress Note    Cynthia Hunt  ZOX:[id number] DOB: 1945/07/26  DOA: 10/22/2018 PCP: Darrow Bussing, MD    Brief Narrative:     Medical records reviewed and are as summarized below:  Cynthia Hunt is an 73 y.o. female  with medical history significant of HTN; HLD: DM; and depression presenting with stroke-like symptoms. She slipped off the bed and couldn't get off the floor.  It happened this morning about 0630.  She noticed her left hand was numb of Friday.  Her arm felt heavy and she was unable to hold anything in it, everything she picked up she dropped.  She also noticed her left leg was acting funny, like she had a flat foot.  Her family wanted her to come to the hospital but she didn't want to.  No dysphagia or dysarthria.  She couldn't get off the toilet once she sat down.  It was hard for her to get out of the bed.  Assessment/Plan:   Principal Problem:   CVA (cerebral vascular accident) [facility]) Active Problems:   Uncontrolled type 2 diabetes mellitus without complication, with long-term current use of insulin (HCC)   Essential hypertension   Dyslipidemia  CVA -Patient developed L hemiparesis on Friday concerning for CVA, but refused evaluation until today -CT with R basal ganglia and centrum semiovale hypodensities c/w acute/subacute CVA -Telemetry -MRI/MRA: Multifocal areas of acute infarction, RIGHT MCA territory, affect the RIGHT frontal and posterior frontal cortex and subcortical white matter. High-grade stenosis of the RIGHT M2 MCA superior division is likely contributory to the observed pattern of ischemia. 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 50-75% LEFT ICA cavernous/supraclinoid stenosis; otherwise, no proximal flow-limiting stenosis of the carotid or basilar arteries. -Echo: The aortic valve is abnormal. Aortic valve regurgitation is trivial by color flow Doppler. There is leaflet thickening along the  coaptation points centrally. Cannot exclude aortic valve vegetations. Differential diagnosis includes nonbacterial  thrombotic endocarditis, infective endocarditis, papillary fibroelastoma, or valve degeneration. Consider cardiology consultation with consideration for further imaging if clinically indicated in the setting of stroke.  -LDL 81 HgbA1c: 9 -ASA daily -Neurology consult appreciated -PT/OT/ST/Nutrition Consults  HTN -Allow permissive HTNfor now -Treat BP only if >220/120, and then with goal of 15% reduction -Hold ARB and HCTZand plan to restart in 48-72 hours  HLD -LDL 81-- goal of <70 -Resume statin but will change Zocor to Lipitor 40 mg daily  DM -A1c : 9 -Hold home PO metfomin -Continue Lantus -Will order moderate-scale SSI including qhs coverage  obesity Body mass index is 32.11 kg/m.   Family Communication/Anticipated D/C date and plan/Code Status   DVT prophylaxis: Lovenox ordered. Code Status: Full Code.  Family Communication:  Disposition Plan: pending-- suspect will need TEE   Medical Consultants:    Neuro  ? Cardiology consult -- will defer to neurology  Subjective:   Wants some clothes if she has to get up and walk around  Objective:    Vitals:   10/22/18 1921 10/22/18 2349 10/23/18 0356 10/23/18 0729  BP: (!) 155/80 (!) 177/83 (!) 145/70 (!) 169/80  Pulse: 99 80 74 77  Resp: 20 20 20 15   Temp: 98 F (36.7 C) 98.6 F (37 C) 98.9 F (37.2 C) 98.2 F (36.8 C)  TempSrc: Oral Oral Oral Oral  SpO2: 98% 97% 96% 96%  Weight:      Height:        Intake/Output Summary (Last 24 hours) at  10/23/2018 1027 Last data filed at 10/22/2018 1500 Gross per 24 hour  Intake 265.68 ml  Output --  Net 265.68 ml   Filed Weights   10/22/18 0752  Weight: 93 kg    Exam: On side of bed A+Ox3 Pleasant and cooperative rrr No increased work of breathing +BS, soft, NT  Data Reviewed:   I have personally reviewed following labs and  imaging studies:  Labs: Labs show the following:   Basic Metabolic Panel: Recent Labs  Lab 10/22/18 0755 10/22/18 0759  NA 140  --   K 3.4*  --   CL 100  --   CO2 22  --   GLUCOSE 175*  --   BUN 18  --   CREATININE 1.34* 1.10*  CALCIUM 8.2*  --    GFR Estimated Creatinine Clearance: 54.2 mL/min (A) (by C-G formula based on SCr of 1.1 mg/dL (H)). Liver Function Tests: Recent Labs  Lab 10/22/18 0755  AST 22  ALT 20  ALKPHOS 49  BILITOT 0.8  PROT 7.7  ALBUMIN 3.5   No results for input(s): LIPASE, AMYLASE in the last 168 hours. No results for input(s): AMMONIA in the last 168 hours. Coagulation profile Recent Labs  Lab 10/22/18 0755  INR 1.1    CBC: Recent Labs  Lab 10/22/18 0755  WBC 7.4  NEUTROABS 6.1  HGB 10.5*  HCT 35.0*  MCV 94.3  PLT 238   Cardiac Enzymes: Recent Labs  Lab 10/22/18 0755  CKTOTAL 69   BNP (last 3 results) No results for input(s): PROBNP in the last 8760 hours. CBG: No results for input(s): GLUCAP in the last 168 hours. D-Dimer: No results for input(s): DDIMER in the last 72 hours. Hgb A1c: Recent Labs    10/22/18 0755 10/23/18 0604  HGBA1C 9.2* 9.0*   Lipid Profile: Recent Labs    10/23/18 0604  CHOL 136  HDL 41  LDLCALC 81  TRIG 71  CHOLHDL 3.3   Thyroid function studies: Recent Labs    10/22/18 1107  TSH 1.061   Anemia work up: No results for input(s): VITAMINB12, FOLATE, FERRITIN, TIBC, IRON, RETICCTPCT in the last 72 hours. Sepsis Labs: Recent Labs  Lab 10/22/18 0755  WBC 7.4    Microbiology No results found for this or any previous visit (from the past 240 hour(s)).  Procedures and diagnostic studies:  Ct Head Wo Contrast  Result Date: 10/22/2018 CLINICAL DATA:  Increasing LEFT-sided weakness. Symptoms began 2 days ago. EXAM: CT HEAD WITHOUT CONTRAST TECHNIQUE: Contiguous axial images were obtained from the base of the skull through the vertex without intravenous contrast. COMPARISON:  No  priors for comparison. FINDINGS: Brain: Age-indeterminate hypodensity of RIGHT basal ganglia, between the caudate and lentiform nucleus, could represent developing acute infarction. Similarly, asymmetric hypoattenuation of the RIGHT centrum semiovale, could be acute or chronic. No definite acute cortical hypodensities. No hemorrhage, mass lesion, hydrocephalus, or extra-axial fluid. Mild cerebral and cerebellar atrophy. Generalized white matter hypoattenuation could represent small vessel disease. Vascular: Calcification of the cavernous internal carotid arteries consistent with cerebrovascular atherosclerotic disease. No signs of intracranial large vessel occlusion. Skull: Intact. Sinuses/Orbits: No acute sinus findings. Conjugate gaze to the RIGHT. Other: None. IMPRESSION: 1. Age-indeterminate RIGHT basal ganglia and RIGHT centrum semiovale hypodensities could represent acute or chronic infarction. 2. If no contraindications, MRI of the brain without contrast recommended for further evaluation. Electronically Signed   By: Elsie Stain M.D.   On: 10/22/2018 08:28   Mr [name] Head Wo Contrast  Result Date: 10/22/2018 CLINICAL DATA:  Stroke-like symptoms. Slipped off bed, and could not get off floor. Reportedly, LEFT hemiparesis for 2 days, but patient did not want to come to hospital. Stroke risk factors include uncontrolled type 2 diabetes mellitus, essential hypertension and dyslipidemia. EXAM: MR HEAD WITHOUT CONTRAST MR CIRCLE OF WILLIS WITHOUT CONTRAST MRA OF THE NECK WITHOUT AND WITH CONTRAST TECHNIQUE: Multiplanar, multiecho pulse sequences of the brain, circle of Willis and surrounding structures were obtained without intravenous contrast. Angiographic images of the neck were obtained using MRA technique without and with intravenous contrast. CONTRAST:  Gadavist 10 mL. COMPARISON:  Noncontrast CT head earlier today. FINDINGS: MR HEAD FINDINGS Brain: Multifocal areas of restricted diffusion, corresponding  low ADC, affect the RIGHT frontal cortex, posterior frontal cortex, and subcortical white matter consistent with acute nonhemorrhagic infarcts. One area of acute infarction, roughly 8 mm in diameter, corresponds with the abnormality noted on CT earlier today. The second area questioned on earlier CT, in the RIGHT basal ganglia, represents an area of chronic infarction. No mass lesion, hydrocephalus, or extra-axial fluid. Generalized atrophy. Moderately advanced T2 and FLAIR hyperintensities throughout the white matter, likely small vessel disease. Vascular: Reported separately. Skull and upper cervical spine: No acute findings. Partial empty sella. Sinuses/Orbits: Mucosal thickening.  Negative orbits. Other: None. MR CIRCLE OF WILLIS FINDINGS RIGHT ICA: Nonstenotic irregularity in its cavernous segment. Otherwise widely patent. M1 MCA widely patent. A1 ACA widely patent. High-grade 75% or greater stenosis of the superior division M2 MCA on the RIGHT. See series 5 image 122. This is likely contributory to the observed pattern of infarction. LEFT ICA: 50% cavernous stenosis. 50-75% stenosis at the junction of the cavernous and supraclinoid ICA. LEFT M1 MCA widely patent. 50% stenosis origin LEFT A1 ACA. Nonstenotic irregularity LEFT M2 vessels. Distal anterior cerebral arteries are mildly irregular but patent. Basilar artery mildly irregular but patent. RIGHT vertebral dominant contributor. LEFT vertebral diminutive but patent. Fetal origin LEFT PCA. RIGHT PCA widely patent. No cerebellar branch occlusion. No saccular aneurysm. MRA NECK FINDINGS Dolichoectatic great vessels, but no proximal stenosis. Nonstenotic irregularity RIGHT carotid bifurcation. Nonstenotic irregularity LEFT carotid bifurcation. Both vertebral arteries are patent without significant ostial stenosis, RIGHT dominant. IMPRESSION: Multifocal areas of acute infarction, RIGHT MCA territory, affect the RIGHT frontal and posterior frontal cortex and  subcortical white matter. High-grade stenosis of the RIGHT M2 MCA superior division is likely contributory to the observed pattern of ischemia. 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 No intracranial hemorrhage or mass. 50-75% LEFT ICA cavernous/supraclinoid stenosis; otherwise, no proximal flow-limiting stenosis of the carotid or basilar arteries. Extracranial vasculature is dolichoectatic without flow-limiting stenosis, dissection, or occlusion. Minor atheromatous change both carotid bifurcations. Electronically Signed   By: Elsie Stain M.D.   On: 10/22/2018 14:22   Mr Angiogram Neck W Or Wo Contrast  Result Date: 10/22/2018 CLINICAL DATA:  Stroke-like symptoms. Slipped off bed, and could not get off floor. Reportedly, LEFT hemiparesis for 2 days, but patient did not want to come to hospital. Stroke risk factors include uncontrolled type 2 diabetes mellitus, essential hypertension and dyslipidemia. EXAM: MR HEAD WITHOUT CONTRAST MR CIRCLE OF WILLIS WITHOUT CONTRAST MRA OF THE NECK WITHOUT AND WITH CONTRAST TECHNIQUE: Multiplanar, multiecho pulse sequences of the brain, circle of Willis and surrounding structures were obtained without intravenous contrast. Angiographic images of the neck were obtained using MRA technique without and with intravenous contrast. CONTRAST:  Gadavist 10 mL. COMPARISON:  Noncontrast CT head  earlier today. FINDINGS: MR HEAD FINDINGS Brain: Multifocal areas of restricted diffusion, corresponding low ADC, affect the RIGHT frontal cortex, posterior frontal cortex, and subcortical white matter consistent with acute nonhemorrhagic infarcts. One area of acute infarction, roughly 8 mm in diameter, corresponds with the abnormality noted on CT earlier today. The second area questioned on earlier CT, in the RIGHT basal ganglia, represents an area of chronic infarction. No mass lesion, hydrocephalus, or extra-axial fluid. Generalized atrophy.  Moderately advanced T2 and FLAIR hyperintensities throughout the white matter, likely small vessel disease. Vascular: Reported separately. Skull and upper cervical spine: No acute findings. Partial empty sella. Sinuses/Orbits: Mucosal thickening.  Negative orbits. Other: None. MR CIRCLE OF WILLIS FINDINGS RIGHT ICA: Nonstenotic irregularity in its cavernous segment. Otherwise widely patent. M1 MCA widely patent. A1 ACA widely patent. High-grade 75% or greater stenosis of the superior division M2 MCA on the RIGHT. See series 5 image 122. This is likely contributory to the observed pattern of infarction. LEFT ICA: 50% cavernous stenosis. 50-75% stenosis at the junction of the cavernous and supraclinoid ICA. LEFT M1 MCA widely patent. 50% stenosis origin LEFT A1 ACA. Nonstenotic irregularity LEFT M2 vessels. Distal anterior cerebral arteries are mildly irregular but patent. Basilar artery mildly irregular but patent. RIGHT vertebral dominant contributor. LEFT vertebral diminutive but patent. Fetal origin LEFT PCA. RIGHT PCA widely patent. No cerebellar branch occlusion. No saccular aneurysm. MRA NECK FINDINGS Dolichoectatic great vessels, but no proximal stenosis. Nonstenotic irregularity RIGHT carotid bifurcation. Nonstenotic irregularity LEFT carotid bifurcation. Both vertebral arteries are patent without significant ostial stenosis, RIGHT dominant. IMPRESSION: Multifocal areas of acute infarction, RIGHT MCA territory, affect the RIGHT frontal and posterior frontal cortex and subcortical white matter. High-grade stenosis of the RIGHT M2 MCA superior division is likely contributory to the observed pattern of ischemia. 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 No intracranial hemorrhage or mass. 50-75% LEFT ICA cavernous/supraclinoid stenosis; otherwise, no proximal flow-limiting stenosis of the carotid or basilar arteries. Extracranial vasculature is dolichoectatic  without flow-limiting stenosis, dissection, or occlusion. Minor atheromatous change both carotid bifurcations. Electronically Signed   By: Elsie Stain M.D.   On: 10/22/2018 14:22   Mr Brain Wo Contrast  Result Date: 10/22/2018 CLINICAL DATA:  Stroke-like symptoms. Slipped off bed, and could not get off floor. Reportedly, LEFT hemiparesis for 2 days, but patient did not want to come to hospital. Stroke risk factors include uncontrolled type 2 diabetes mellitus, essential hypertension and dyslipidemia. EXAM: MR HEAD WITHOUT CONTRAST MR CIRCLE OF WILLIS WITHOUT CONTRAST MRA OF THE NECK WITHOUT AND WITH CONTRAST TECHNIQUE: Multiplanar, multiecho pulse sequences of the brain, circle of Willis and surrounding structures were obtained without intravenous contrast. Angiographic images of the neck were obtained using MRA technique without and with intravenous contrast. CONTRAST:  Gadavist 10 mL. COMPARISON:  Noncontrast CT head earlier today. FINDINGS: MR HEAD FINDINGS Brain: Multifocal areas of restricted diffusion, corresponding low ADC, affect the RIGHT frontal cortex, posterior frontal cortex, and subcortical white matter consistent with acute nonhemorrhagic infarcts. One area of acute infarction, roughly 8 mm in diameter, corresponds with the abnormality noted on CT earlier today. The second area questioned on earlier CT, in the RIGHT basal ganglia, represents an area of chronic infarction. No mass lesion, hydrocephalus, or extra-axial fluid. Generalized atrophy. Moderately advanced T2 and FLAIR hyperintensities throughout the white matter, likely small vessel disease. Vascular: Reported separately. Skull and upper cervical spine: No acute findings. Partial empty sella. Sinuses/Orbits: Mucosal thickening.  Negative  orbits. Other: None. MR CIRCLE OF WILLIS FINDINGS RIGHT ICA: Nonstenotic irregularity in its cavernous segment. Otherwise widely patent. M1 MCA widely patent. A1 ACA widely patent. High-grade 75% or  greater stenosis of the superior division M2 MCA on the RIGHT. See series 5 image 122. This is likely contributory to the observed pattern of infarction. LEFT ICA: 50% cavernous stenosis. 50-75% stenosis at the junction of the cavernous and supraclinoid ICA. LEFT M1 MCA widely patent. 50% stenosis origin LEFT A1 ACA. Nonstenotic irregularity LEFT M2 vessels. Distal anterior cerebral arteries are mildly irregular but patent. Basilar artery mildly irregular but patent. RIGHT vertebral dominant contributor. LEFT vertebral diminutive but patent. Fetal origin LEFT PCA. RIGHT PCA widely patent. No cerebellar branch occlusion. No saccular aneurysm. MRA NECK FINDINGS Dolichoectatic great vessels, but no proximal stenosis. Nonstenotic irregularity RIGHT carotid bifurcation. Nonstenotic irregularity LEFT carotid bifurcation. Both vertebral arteries are patent without significant ostial stenosis, RIGHT dominant. IMPRESSION: Multifocal areas of acute infarction, RIGHT MCA territory, affect the RIGHT frontal and posterior frontal cortex and subcortical white matter. High-grade stenosis of the RIGHT M2 MCA superior division is likely contributory to the observed pattern of ischemia. 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 No intracranial hemorrhage or mass. 50-75% LEFT ICA cavernous/supraclinoid stenosis; otherwise, no proximal flow-limiting stenosis of the carotid or basilar arteries. Extracranial vasculature is dolichoectatic without flow-limiting stenosis, dissection, or occlusion. Minor atheromatous change both carotid bifurcations. Electronically Signed   By: Elsie Stain M.D.   On: 10/22/2018 14:22   Vas US Carotid (at [facility])  Result Date: 10/22/2018 Carotid Arterial Duplex Study Indications: CVA. Performing Technologist: Blanch Media RVS  Examination Guidelines: A complete evaluation includes B-mode imaging, spectral Doppler, color Doppler, and power Doppler  as needed of all accessible portions of each vessel. Bilateral testing is considered an integral part of a complete examination. Limited examinations for reoccurring indications may be performed as noted.  Right Carotid Findings: +----------+--------+--------+--------+------------+--------+             PSV cm/s EDV cm/s Stenosis Describe     Comments  +----------+--------+--------+--------+------------+--------+  CCA Prox   45                         heterogenous           +----------+--------+--------+--------+------------+--------+  CCA Distal 61       14                heterogenous           +----------+--------+--------+--------+------------+--------+  ICA Prox   75       25       1-39%    heterogenous           +----------+--------+--------+--------+------------+--------+  ICA Distal 59       19                                       +----------+--------+--------+--------+------------+--------+  ECA        113      6                                        +----------+--------+--------+--------+------------+--------+ +----------+--------+-------+--------+-------------------+             PSV cm/s  EDV cms Describe Arm Pressure (mmHG)  +----------+--------+-------+--------+-------------------+  Subclavian 86                                             +----------+--------+-------+--------+-------------------+ +---------+--------+--+--------+--+---------+  Vertebral PSV cm/s 53 EDV cm/s 16 Antegrade  +---------+--------+--+--------+--+---------+  Left Carotid Findings: +----------+--------+--------+--------+------------+--------+             PSV cm/s EDV cm/s Stenosis Describe     Comments  +----------+--------+--------+--------+------------+--------+  CCA Prox   111      17                heterogenous           +----------+--------+--------+--------+------------+--------+  CCA Distal 66       14                heterogenous           +----------+--------+--------+--------+------------+--------+  ICA Prox   86        27       1-39%    heterogenous           +----------+--------+--------+--------+------------+--------+  ICA Distal 56       18                                       +----------+--------+--------+--------+------------+--------+  ECA        85       7                                        +----------+--------+--------+--------+------------+--------+ +----------+--------+--------+--------+-------------------+  Subclavian PSV cm/s EDV cm/s Describe Arm Pressure (mmHG)  +----------+--------+--------+--------+-------------------+             93                                              +----------+--------+--------+--------+-------------------+ +---------+--------+--+--------+--+---------+  Vertebral PSV cm/s 32 EDV cm/s 10 Antegrade  +---------+--------+--+--------+--+---------+    Preliminary     Medications:     stroke: mapping our early stages of recovery book   Does not apply Once   aspirin  300 mg Rectal Daily   Or   aspirin  325 mg Oral Daily   atorvastatin  40 mg Oral q1800   enoxaparin (LOVENOX) injection  40 mg Subcutaneous Q24H   insulin aspart  0-15 Units Subcutaneous TID WC   insulin aspart  0-5 Units Subcutaneous QHS   insulin glargine  30 Units Subcutaneous QHS   LORazepam  0.5 mg Intravenous Once   methocarbamol  500 mg Oral Daily   traZODone  100 mg Oral QHS   Continuous Infusions:  sodium chloride 50 mL/hr at 10/22/18 1115     LOS: 1 day   Joseph Art  [facility]   How to contact the [facility] [employer] Attending or Consulting provider 7A - 7P or covering provider during after hours 7P -7A, for this patient?  1. Check the care team in [facility] and look for a) attending/consulting [facility] provider listed and b) the [facility] team listed 2. Log into [email or web address] and use [place]'s  universal password to access. If you do not have the password, please contact the hospital operator. 3. Locate the [facility] provider you are looking for under [facility] and page to a number  that you can be directly reached. 4. If you still have difficulty reaching the provider, please page the [facility] (Director on Call) for the Hospitalists listed on amion for assistance.  10/23/2018, 10:27 AM

## 2018-10-23 NOTE — Consult Note (Signed)
   [facility] Inpatient Consult   10/23/2018  Cynthia Hunt Jul 14, 1945 [id number]   Patient is currently active with [facility] Care Management for chronic special disease management [CSNP]  with [employer] for services.  Patient is assigned to a Chronic Care Management Coordinator.  Inpatient TOC team member made aware of patient's program.    Of note, [facility] Care Management services does not replace or interfere with any services that are needed or arranged by inpatient case management or social work.  For additional questions or referrals please contact:  Charlesetta Shanks, RN BSN CCM [facility]  [phone] business mobile phone Toll free office [phone]

## 2018-10-23 NOTE — Progress Notes (Signed)
STROKE TEAM PROGRESS NOTE   INTERVAL HISTORY Patient is neurologically stable.  MRI confirms right basal ganglia lacunar infarct however transthoracic echo shows normal EF but has abnormal thickening of the valves raising question of vegetation versus fibroblastoma requiring further study.  Recommend cardiology consult and TEE  Vitals:   10/22/18 2349 10/23/18 0356 10/23/18 0729 10/23/18 1109  BP: (!) 177/83 (!) 145/70 (!) 169/80 (!) 158/76  Pulse: 80 74 77 77  Resp: _0 Temp: 98.6 F (37 C) 98.9 F (37.2 C) 98.2 F (36.8 C) 98.5 F (36.9 C)  TempSrc: Oral Oral Oral Oral  SpO2: 97% 96% 96% 94%  Weight:      Height:        CBC:  Recent Labs  Lab 10/22/18 0755  WBC 7.4  NEUTROABS 6.1  HGB 10.5*  HCT 35.0*  MCV 94.3  PLT 979    Basic Metabolic Panel:  Recent Labs  Lab 10/22/18 0755 10/22/18 0759  NA 140  --   K 3.4*  --   CL 100  --   CO2 22  --   GLUCOSE 175*  --   BUN 18  --   CREATININE 1.34* 1.10*  CALCIUM 8.2*  --    Lipid Panel:     Component Value Date/Time   CHOL 136 10/23/2018 0604   TRIG 71 10/23/2018 0604   HDL 41 10/23/2018 0604   CHOLHDL 3.3 10/23/2018 0604   VLDL 14 10/23/2018 0604   LDLCALC 81 10/23/2018 0604   HgbA1c:  Lab Results  Component Value Date   HGBA1C 9.0 (H) 10/23/2018   Urine Drug Screen: No results found for: LABOPIA, COCAINSCRNUR, LABBENZ, AMPHETMU, THCU, LABBARB  Alcohol Level     Component Value Date/Time   ETH <10 10/22/2018 0755    IMAGING Ct Head Wo Contrast  Result Date: 10/22/2018 CLINICAL DATA:  Increasing LEFT-sided weakness. Symptoms began 2 days ago. EXAM: CT HEAD WITHOUT CONTRAST TECHNIQUE: Contiguous axial images were obtained from the base of the skull through the vertex without intravenous contrast. COMPARISON:  No priors for comparison. FINDINGS: Brain: Age-indeterminate hypodensity of RIGHT basal ganglia, between the caudate and lentiform nucleus, could represent developing acute infarction.  Similarly, asymmetric hypoattenuation of the RIGHT centrum semiovale, could be acute or chronic. No definite acute cortical hypodensities. No hemorrhage, mass lesion, hydrocephalus, or extra-axial fluid. Mild cerebral and cerebellar atrophy. Generalized white matter hypoattenuation could represent small vessel disease. Vascular: Calcification of the cavernous internal carotid arteries consistent with cerebrovascular atherosclerotic disease. No signs of intracranial large vessel occlusion. Skull: Intact. Sinuses/Orbits: No acute sinus findings. Conjugate gaze to the RIGHT. Other: None. IMPRESSION: 1. Age-indeterminate RIGHT basal ganglia and RIGHT centrum semiovale hypodensities could represent acute or chronic infarction. 2. If no contraindications, MRI of the brain without contrast recommended for further evaluation. Electronically Signed   By: Staci Righter M.D.   On: 10/22/2018 08:28   Mr Jodene Nam Head Wo Contrast  Result Date: 10/22/2018 CLINICAL DATA:  Stroke-like symptoms. Slipped off bed, and could not get off floor. Reportedly, LEFT hemiparesis for 2 days, but patient did not want to come to hospital. Stroke risk factors include uncontrolled type 2 diabetes mellitus, essential hypertension and dyslipidemia. EXAM: MR HEAD WITHOUT CONTRAST MR CIRCLE OF WILLIS WITHOUT CONTRAST MRA OF THE NECK WITHOUT AND WITH CONTRAST TECHNIQUE: Multiplanar, multiecho pulse sequences of the brain, circle of Willis and surrounding structures were obtained without intravenous contrast. Angiographic images of the neck were obtained using MRA technique  without and with intravenous contrast. CONTRAST:  Gadavist 10 mL. COMPARISON:  Noncontrast CT head earlier today. FINDINGS: MR HEAD FINDINGS Brain: Multifocal areas of restricted diffusion, corresponding low ADC, affect the RIGHT frontal cortex, posterior frontal cortex, and subcortical white matter consistent with acute nonhemorrhagic infarcts. One area of acute infarction, roughly 8  mm in diameter, corresponds with the abnormality noted on CT earlier today. The second area questioned on earlier CT, in the RIGHT basal ganglia, represents an area of chronic infarction. No mass lesion, hydrocephalus, or extra-axial fluid. Generalized atrophy. Moderately advanced T2 and FLAIR hyperintensities throughout the white matter, likely small vessel disease. Vascular: Reported separately. Skull and upper cervical spine: No acute findings. Partial empty sella. Sinuses/Orbits: Mucosal thickening.  Negative orbits. Other: None. MR CIRCLE OF WILLIS FINDINGS RIGHT ICA: Nonstenotic irregularity in its cavernous segment. Otherwise widely patent. M1 MCA widely patent. A1 ACA widely patent. High-grade 75% or greater stenosis of the superior division M2 MCA on the RIGHT. See series 5 image 122. This is likely contributory to the observed pattern of infarction. LEFT ICA: 50% cavernous stenosis. 50-75% stenosis at the junction of the cavernous and supraclinoid ICA. LEFT M1 MCA widely patent. 50% stenosis origin LEFT A1 ACA. Nonstenotic irregularity LEFT M2 vessels. Distal anterior cerebral arteries are mildly irregular but patent. Basilar artery mildly irregular but patent. RIGHT vertebral dominant contributor. LEFT vertebral diminutive but patent. Fetal origin LEFT PCA. RIGHT PCA widely patent. No cerebellar branch occlusion. No saccular aneurysm. MRA NECK FINDINGS Dolichoectatic great vessels, but no proximal stenosis. Nonstenotic irregularity RIGHT carotid bifurcation. Nonstenotic irregularity LEFT carotid bifurcation. Both vertebral arteries are patent without significant ostial stenosis, RIGHT dominant. IMPRESSION: Multifocal areas of acute infarction, RIGHT MCA territory, affect the RIGHT frontal and posterior frontal cortex and subcortical white matter. High-grade stenosis of the RIGHT M2 MCA superior division is likely contributory to the observed pattern of ischemia. Atrophy and small vessel disease with  evidence of chronic infarction RIGHT basal ganglia. No intracranial hemorrhage or mass. 50-75% LEFT ICA cavernous/supraclinoid stenosis; otherwise, no proximal flow-limiting stenosis of the carotid or basilar arteries. Extracranial vasculature is dolichoectatic without flow-limiting stenosis, dissection, or occlusion. Minor atheromatous change both carotid bifurcations. Electronically Signed   By: Staci Righter M.D.   On: 10/22/2018 14:22   Mr Angiogram Neck W Or Wo Contrast  Result Date: 10/22/2018 CLINICAL DATA:  Stroke-like symptoms. Slipped off bed, and could not get off floor. Reportedly, LEFT hemiparesis for 2 days, but patient did not want to come to hospital. Stroke risk factors include uncontrolled type 2 diabetes mellitus, essential hypertension and dyslipidemia. EXAM: MR HEAD WITHOUT CONTRAST MR CIRCLE OF WILLIS WITHOUT CONTRAST MRA OF THE NECK WITHOUT AND WITH CONTRAST TECHNIQUE: Multiplanar, multiecho pulse sequences of the brain, circle of Willis and surrounding structures were obtained without intravenous contrast. Angiographic images of the neck were obtained using MRA technique without and with intravenous contrast. CONTRAST:  Gadavist 10 mL. COMPARISON:  Noncontrast CT head earlier today. FINDINGS: MR HEAD FINDINGS Brain: Multifocal areas of restricted diffusion, corresponding low ADC, affect the RIGHT frontal cortex, posterior frontal cortex, and subcortical white matter consistent with acute nonhemorrhagic infarcts. One area of acute infarction, roughly 8 mm in diameter, corresponds with the abnormality noted on CT earlier today. The second area questioned on earlier CT, in the RIGHT basal ganglia, represents an area of chronic infarction. No mass lesion, hydrocephalus, or extra-axial fluid. Generalized atrophy. Moderately advanced T2 and FLAIR hyperintensities throughout the white matter, likely small vessel disease. Vascular:  Reported separately. Skull and upper cervical spine: No acute  findings. Partial empty sella. Sinuses/Orbits: Mucosal thickening.  Negative orbits. Other: None. MR CIRCLE OF WILLIS FINDINGS RIGHT ICA: Nonstenotic irregularity in its cavernous segment. Otherwise widely patent. M1 MCA widely patent. A1 ACA widely patent. High-grade 75% or greater stenosis of the superior division M2 MCA on the RIGHT. See series 5 image 122. This is likely contributory to the observed pattern of infarction. LEFT ICA: 50% cavernous stenosis. 50-75% stenosis at the junction of the cavernous and supraclinoid ICA. LEFT M1 MCA widely patent. 50% stenosis origin LEFT A1 ACA. Nonstenotic irregularity LEFT M2 vessels. Distal anterior cerebral arteries are mildly irregular but patent. Basilar artery mildly irregular but patent. RIGHT vertebral dominant contributor. LEFT vertebral diminutive but patent. Fetal origin LEFT PCA. RIGHT PCA widely patent. No cerebellar branch occlusion. No saccular aneurysm. MRA NECK FINDINGS Dolichoectatic great vessels, but no proximal stenosis. Nonstenotic irregularity RIGHT carotid bifurcation. Nonstenotic irregularity LEFT carotid bifurcation. Both vertebral arteries are patent without significant ostial stenosis, RIGHT dominant. IMPRESSION: Multifocal areas of acute infarction, RIGHT MCA territory, affect the RIGHT frontal and posterior frontal cortex and subcortical white matter. High-grade stenosis of the RIGHT M2 MCA superior division is likely contributory to the observed pattern of ischemia. 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 No intracranial hemorrhage or mass. 50-75% LEFT ICA cavernous/supraclinoid stenosis; otherwise, no proximal flow-limiting stenosis of the carotid or basilar arteries. Extracranial vasculature is dolichoectatic without flow-limiting stenosis, dissection, or occlusion. Minor atheromatous change both carotid bifurcations. Electronically Signed   By: Staci Righter M.D.   On: 10/22/2018 14:22    Mr Brain Wo Contrast  Result Date: 10/22/2018 CLINICAL DATA:  Stroke-like symptoms. Slipped off bed, and could not get off floor. Reportedly, LEFT hemiparesis for 2 days, but patient did not want to come to hospital. Stroke risk factors include uncontrolled type 2 diabetes mellitus, essential hypertension and dyslipidemia. EXAM: MR HEAD WITHOUT CONTRAST MR CIRCLE OF WILLIS WITHOUT CONTRAST MRA OF THE NECK WITHOUT AND WITH CONTRAST TECHNIQUE: Multiplanar, multiecho pulse sequences of the brain, circle of Willis and surrounding structures were obtained without intravenous contrast. Angiographic images of the neck were obtained using MRA technique without and with intravenous contrast. CONTRAST:  Gadavist 10 mL. COMPARISON:  Noncontrast CT head earlier today. FINDINGS: MR HEAD FINDINGS Brain: Multifocal areas of restricted diffusion, corresponding low ADC, affect the RIGHT frontal cortex, posterior frontal cortex, and subcortical white matter consistent with acute nonhemorrhagic infarcts. One area of acute infarction, roughly 8 mm in diameter, corresponds with the abnormality noted on CT earlier today. The second area questioned on earlier CT, in the RIGHT basal ganglia, represents an area of chronic infarction. No mass lesion, hydrocephalus, or extra-axial fluid. Generalized atrophy. Moderately advanced T2 and FLAIR hyperintensities throughout the white matter, likely small vessel disease. Vascular: Reported separately. Skull and upper cervical spine: No acute findings. Partial empty sella. Sinuses/Orbits: Mucosal thickening.  Negative orbits. Other: None. MR CIRCLE OF WILLIS FINDINGS RIGHT ICA: Nonstenotic irregularity in its cavernous segment. Otherwise widely patent. M1 MCA widely patent. A1 ACA widely patent. High-grade 75% or greater stenosis of the superior division M2 MCA on the RIGHT. See series 5 image 122. This is likely contributory to the observed pattern of infarction. LEFT ICA: 50% cavernous  stenosis. 50-75% stenosis at the junction of the cavernous and supraclinoid ICA. LEFT M1 MCA widely patent. 50% stenosis origin LEFT A1 ACA. Nonstenotic irregularity LEFT M2 vessels. Distal anterior cerebral arteries  are mildly irregular but patent. Basilar artery mildly irregular but patent. RIGHT vertebral dominant contributor. LEFT vertebral diminutive but patent. Fetal origin LEFT PCA. RIGHT PCA widely patent. No cerebellar branch occlusion. No saccular aneurysm. MRA NECK FINDINGS Dolichoectatic great vessels, but no proximal stenosis. Nonstenotic irregularity RIGHT carotid bifurcation. Nonstenotic irregularity LEFT carotid bifurcation. Both vertebral arteries are patent without significant ostial stenosis, RIGHT dominant. IMPRESSION: Multifocal areas of acute infarction, RIGHT MCA territory, affect the RIGHT frontal and posterior frontal cortex and subcortical white matter. High-grade stenosis of the RIGHT M2 MCA superior division is likely contributory to the observed pattern of ischemia. 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 No intracranial hemorrhage or mass. 50-75% LEFT ICA cavernous/supraclinoid stenosis; otherwise, no proximal flow-limiting stenosis of the carotid or basilar arteries. Extracranial vasculature is dolichoectatic without flow-limiting stenosis, dissection, or occlusion. Minor atheromatous change both carotid bifurcations. Electronically Signed   By: Staci Righter M.D.   On: 10/22/2018 14:22   Vas US Carotid (at [place] Only)  Result Date: 10/23/2018 Carotid Arterial Duplex Study Indications: CVA. Performing Technologist: Abram Sander RVS  Examination Guidelines: A complete evaluation includes B-mode imaging, spectral Doppler, color Doppler, and power Doppler as needed of all accessible portions of each vessel. Bilateral testing is considered an integral part of a complete examination. Limited examinations for reoccurring indications  may be performed as noted.  Right Carotid Findings: +----------+--------+--------+--------+------------+--------+           PSV cm/sEDV cm/sStenosisDescribe    Comments +----------+--------+--------+--------+------------+--------+ CCA Prox  45                      heterogenous         +----------+--------+--------+--------+------------+--------+ CCA Distal61      14              heterogenous         +----------+--------+--------+--------+------------+--------+ ICA Prox  75      25      1-39%   heterogenous         +----------+--------+--------+--------+------------+--------+ ICA Distal59      19                                   +----------+--------+--------+--------+------------+--------+ ECA       113     6                                    +----------+--------+--------+--------+------------+--------+ +----------+--------+-------+--------+-------------------+           PSV cm/sEDV cmsDescribeArm Pressure (mmHG) +----------+--------+-------+--------+-------------------+ [id number]                                         +----------+--------+-------+--------+-------------------+ +---------+--------+--+--------+--+---------+ VertebralPSV cm/s53EDV cm/s16Antegrade +---------+--------+--+--------+--+---------+  Left Carotid Findings: +----------+--------+--------+--------+------------+--------+           PSV cm/sEDV cm/sStenosisDescribe    Comments +----------+--------+--------+--------+------------+--------+ CCA Prox  111     17              heterogenous         +----------+--------+--------+--------+------------+--------+ CCA Distal66      14              heterogenous         +----------+--------+--------+--------+------------+--------+  ICA Prox  86      27      1-39%   heterogenous         +----------+--------+--------+--------+------------+--------+ ICA Distal56      18                                    +----------+--------+--------+--------+------------+--------+ ECA       85      7                                    +----------+--------+--------+--------+------------+--------+ +----------+--------+--------+--------+-------------------+ SubclavianPSV cm/sEDV cm/sDescribeArm Pressure (mmHG) +----------+--------+--------+--------+-------------------+           93                                          +----------+--------+--------+--------+-------------------+ +---------+--------+--+--------+--+---------+ VertebralPSV cm/s32EDV cm/s10Antegrade +---------+--------+--+--------+--+---------+  Summary:   *See table(s) above for measurements and observations.  Electronically signed by Antony Contras MD on 10/23/2018 at 1:47:46 PM.    Final    2D echo 1. The aortic valve is abnormal. Aortic valve regurgitation is trivial by color flow Doppler. There is leaflet thickening along the coaptation points centrally. Cannot exclude aortic valve vegetations. Differential diagnosis includes nonbacterial  thrombotic endocarditis, infective endocarditis, papillary fibroelastoma, or valve degeneration. Consider cardiology consultation with consideration for further imaging if clinically indicated in the setting of stroke.  2. The left ventricle has normal systolic function, with an ejection fraction of 55-60%. The cavity size was normal. There is mildly increased left ventricular wall thickness.  3. The right ventricle has normal systolc function. The cavity was normal. There is no increase in right ventricular wall thickness.  4. Mild thickening of the mitral valve leaflet.  5. The inferior vena cava was normal in size with <50% respiratory variability.  PHYSICAL EXAM Pleasant elderly African-American lady currently not in distress. . Afebrile. Head is nontraumatic. Neck is supple without bruit.    Cardiac exam no murmur or gallop. Lungs are clear to auscultation. Distal pulses are well  felt. Neurological Exam :  Awake alert oriented x3 with normal speech and language function.  There is no aphasia apraxia or dysarthria.  Extraocular movements are full range without nystagmus.  Visual fields seem full to bedside confrontation testing.  Mild left lower facial weakness.  Tongue midline.  Motor system exam shows mild left hemiparesis with weakness of left grip and intrinsic hand muscles.  Orbits right over left upper extremity.  Trace weakness of left hip flexors and ankle dorsiflexors only.  Sensation is intact.  Coordination is slow but accurate.  Gait not tested. ASSESSMENT/PLAN Cynthia Hunt [place] is a 73 y.o. female with history of HTN, HLD, DM, AKI, remote tobacco use, depression presenting with left-sided weakness x2 days and headache in setting of hypertensive urgency.   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  MRI R MCA R frontal and posterior frontal cortex and subcortical infarcts.  MRA head high-grade stenosis R M2 superior division.  L ICA 50 to 75% stenosis.  MRA neck dolichoectatic   Carotid Doppler pending  2D Echo abnormal aortic  valve, cannot exclude vegetations.  EF 55 to 60%.  TEE pending   LDL 81  HgbA1c 9.0  Lovenox 40 mg sq daily for VTE prophylaxis  No antithrombotic prior to admission, now on aspirin 325 mg daily. Given stroke, recommend aspirin 81 mg and plavix 75 mg daily x 3 weeks, then aspirin alone. Orders adjusted.   Therapy recommendations: CIR  Disposition: Pending  Hypertensive urgency  Systolic blood pressure on admission 180-200s  . Permissive hypertension (OK if < 220/120) but gradually normalize in 5-7 days . Within limits now . Long-term BP goal normotensive  Hyperlipidemia  Home meds: Lipitor 40, resumed in hospital  LDL 81, goal < 70  Continue statin at  discharge  Diabetes type II Uncontrolled  HgbA1c 9.0, goal < 7.0  Abnormal aortic valve, ? Abnormal MV   Cardiology consulted  TEE tomorrow for further evaluation  BCx pending   ESR  CRP  Afebrile   Other Stroke Risk Factors  Advanced age  Former cigarette smoker, quit 12 years ago advised to stop smoking  History of marijuana use.  UDS not performed this admission  ETOH use, advised to drink no more than 1 drink(s) a day  Obesity, Body mass index is 32.11 kg/m., recommend weight loss, diet and exercise as appropriate   Hospital day # 1  I have personally obtained history,examined this patient, reviewed notes, independently viewed imaging studies, participated in medical decision making and plan of care.ROS completed by me personally and pertinent positives fully documented  I have made any additions or clarifications directly to the above note.  She presented with left hemiparesis secondary to right subcortical infarct etiology likely small vessel disease however transthoracic echo shows thickening of the aortic valve raising question of vegetation versus fibroblastoma.  Recommend cardiology consult to evaluate this further and TEE.  Continue antiplatelet therapy for now.  Aggressive risk factor modification.  Discussed with Dr. Eliseo Squires  Greater than 50% time during this 35-minute visit was spent on counseling and coordination of care about her stroke and abnormal echo findings and answering questions.  Antony Contras, MD Medical Director [facility] Pager: [phone] 10/23/2018 4:10 PM   To contact Stroke Continuity provider, please refer to [email or web address] After hours, contact General Neurology

## 2018-10-24 ENCOUNTER — Encounter (HOSPITAL_COMMUNITY): Payer: Self-pay | Admitting: *Deleted

## 2018-10-24 ENCOUNTER — Inpatient Hospital Stay (HOSPITAL_COMMUNITY): Payer: HMO

## 2018-10-24 ENCOUNTER — Encounter (HOSPITAL_COMMUNITY)
Admission: EM | Disposition: A | Discharge status: Rehabilitation Facility | Payer: Self-pay | Source: Home / Self Care | Attending: Internal Medicine

## 2018-10-24 DIAGNOSIS — I639 Cerebral infarction, unspecified: Secondary | ICD-10-CM

## 2018-10-24 HISTORY — PX: TEE WITHOUT CARDIOVERSION: SHX5443

## 2018-10-24 HISTORY — PX: BUBBLE STUDY: SHX6837

## 2018-10-24 LAB — GLUCOSE, CAPILLARY
Glucose-Capillary: 97 mg/dL (ref 70–99)
Glucose-Capillary: 114 mg/dL — ABNORMAL HIGH (ref 70–99)
Glucose-Capillary: 177 mg/dL — ABNORMAL HIGH (ref 70–99)

## 2018-10-24 SURGERY — ECHOCARDIOGRAM, TRANSESOPHAGEAL
Anesthesia: Moderate Sedation

## 2018-10-24 MED ORDER — MIDAZOLAM HCL (PF) 10 MG/2ML IJ SOLN
INTRAMUSCULAR | Status: DC | PRN
  Administered 2018-10-24: 1 mg via INTRAVENOUS
  Administered 2018-10-24: 2 mg via INTRAVENOUS
  Administered 2018-10-24 (×2): 1 mg via INTRAVENOUS

## 2018-10-24 MED ORDER — FENTANYL CITRATE (PF) 100 MCG/2ML IJ SOLN
INTRAMUSCULAR | Status: DC | PRN
  Administered 2018-10-24 (×4): 25 ug via INTRAVENOUS

## 2018-10-24 MED ORDER — DIPHENHYDRAMINE HCL 50 MG/ML IJ SOLN
INTRAMUSCULAR | Status: AC
  Filled 2018-10-24: qty 1

## 2018-10-24 MED ORDER — MIDAZOLAM HCL (PF) 5 MG/ML IJ SOLN
INTRAMUSCULAR | Status: AC
  Filled 2018-10-24: qty 2

## 2018-10-24 MED ORDER — SODIUM CHLORIDE 0.9 % IV SOLN: INTRAVENOUS | Status: DC

## 2018-10-24 MED ORDER — HYDRALAZINE HCL 20 MG/ML IJ SOLN
10.0000 mg | Freq: Once | INTRAMUSCULAR | Status: AC
  Administered 2018-10-24: 10 mg via INTRAVENOUS

## 2018-10-24 MED ORDER — HYDRALAZINE HCL 20 MG/ML IJ SOLN: 10.0000 mg | Freq: Once | INTRAMUSCULAR | Status: DC

## 2018-10-24 MED ORDER — BUTAMBEN-TETRACAINE-BENZOCAINE 2-2-14 % EX AERO
INHALATION_SPRAY | CUTANEOUS | Status: DC | PRN
  Administered 2018-10-24: 2 via TOPICAL

## 2018-10-24 MED ORDER — FENTANYL CITRATE (PF) 100 MCG/2ML IJ SOLN
INTRAMUSCULAR | Status: AC
  Filled 2018-10-24: qty 2

## 2018-10-24 MED ORDER — HYDRALAZINE HCL 20 MG/ML IJ SOLN
INTRAMUSCULAR | Status: AC
  Filled 2018-10-24: qty 1

## 2018-10-24 NOTE — Progress Notes (Signed)
OT Cancellation Note  Patient Details Name: Cynthia Hunt MRN: [id number] DOB: [birth year]   Cancelled Treatment:    Reason Eval/Treat Not Completed: Patient at procedure or test/ unavailable, off unit at TEE.  Will follow acutely.   Chancy Milroy, OT Acute Rehabilitation Services Pager [phone] Office [phone]    Chancy Milroy 10/24/2018, 2:41 PM

## 2018-10-24 NOTE — Progress Notes (Addendum)
Physical Therapy Cancellation Note   10/24/18 1328  PT Visit Information  Last PT Received On 10/24/18  Reason Eval/Treat Not Completed Patient at procedure or test/unavailable. Pt off unit for TEE. PT will continue to follow acutely.    Erline Levine, PTA Acute Rehabilitation Services Pager: [phone] Office: [phone]

## 2018-10-24 NOTE — H&P (Signed)
Physical Medicine and Rehabilitation Admission H&P    Chief Complaint  Patient presents with  . stroke-like sxs    happened on Friday  chief complaint:weakness  HPI: Cynthia Hunt is a 73 year old right-handed female with history of hypertension maintained on HCTZ 25 mg daily, Cozaar 50 mg daily, diabetes mellitus maintained on Glucophage 1000 mg daily as well as basaglar 35 units daily at bedtime, hyperlipidemia, marijuana use. Patient quit smoking 12 years ago.  Per chart review and patient, patient lives with her 73 year old Scientist, forensicgoddaughter. Independent driving prior to admission. Goddaughter assist as needed. Presented 10/22/2018 with headache and left-sided weakness x2 days.  On presentation, blood pressure was noted to systolic 180s-200s.Cranial CT scan showed age indeterminate right basal ganglia and right centrum semiovale ovale hypodensities. MRI brain ordered, personally reviewed, multiple small right brain infarcts.  MRI/MRA brain per report suggesting multifocal areas of acute infarction right MCA territory affecting the right frontal and posterior frontal cortex and subcortical white matter. 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 TEE completed 10/24/2018 showing no thrombus or vegetation ejection fraction 55%. Bubble contrast study negative.. Currently maintained on aspirin 81 mg daily and Plavix 75 mg daily 3 weeks than aspirin alone. Subcutaneous Lovenox for DVT prophylaxis.  Hospital course further complicated by significant persistent hypokalemia, supplement initiated.  Acute on chronic anemia hemoglobin 9.2. Therapy evaluations completed with recommendations of physical medicine rehabilitation consult. Patient was admitted for a comprehensive rehabilitation program.  Review of Systems   Constitutional: Negative for chills and fever.  HENT: Negative for hearing loss.   Eyes: Negative for blurred vision and double vision.  Respiratory: Negative for cough and shortness of breath.   Cardiovascular: Negative for chest pain and palpitations.  Gastrointestinal: Positive for constipation. Negative for heartburn, nausea and vomiting.  Genitourinary: Negative for dysuria, flank pain and hematuria.  Musculoskeletal: Positive for myalgias.  Skin: Negative for rash.  Neurological: Positive for speech change, focal weakness, weakness and headaches.  Psychiatric/Behavioral: Positive for depression. The patient has insomnia.   All other systems reviewed and are negative.  Past Medical History:  Diagnosis Date  . Depression   . Diabetes mellitus without complication (HCC)   . Hyperlipidemia   . Hypertension    Past Surgical History:  Procedure Laterality Date  . BUBBLE STUDY  10/24/2018   Procedure: BUBBLE STUDY;  Surgeon: Jake BatheSkains, Mark C, MD;  [facility];  Service: Cardiovascular;;  . TEE WITHOUT CARDIOVERSION N/A 10/24/2018   Procedure: TRANSESOPHAGEAL ECHOCARDIOGRAM (TEE);  Surgeon: Jake BatheSkains, Mark C, MD;  [facility];  Service: Cardiovascular;  Laterality: N/A;  . UTERINE FIBROID SURGERY     Family History  Problem Relation Age of Onset  . Stroke Mother    Social History:  reports that she quit smoking about 12 years ago. She has never used smokeless tobacco. She reports current alcohol use. She reports previous drug use. Drug: Marijuana. Allergies:  Allergies  Allergen Reactions  . Penicillins Other (See Comments)    Childhood Allergy Did it involve swelling of the face/tongue/throat, SOB, or low BP? Unknown Did it involve sudden or severe rash/hives, skin peeling, or any reaction on the inside of your mouth or nose? Unknown Did you need to seek medical attention at a hospital or doctor's office? Unknown When did it last happen?childhood If all  above answers are "NO", may  proceed with cephalosporin use.    Medications Prior to Admission  Medication Sig Dispense Refill  . acetaminophen (TYLENOL) 500 MG tablet Take 1,000 mg by mouth daily.    . Alcohol Swabs (ALCOHOL PREP) 70 % PADS     . ALPRAZolam (XANAX) 0.5 MG tablet Take 0.5 mg by mouth daily as needed (anxiety while driving).     Melene Muller. [START ON 10/26/2018] aspirin EC 81 MG EC tablet Take 1 tablet (81 mg total) by mouth daily.    Marland Kitchen. atorvastatin (LIPITOR) 40 MG tablet Take 1 tablet (40 mg total) by mouth daily at 6 PM.    . Biotin w/ Vitamins C & E (HAIR SKIN & NAILS GUMMIES PO) Take 1 tablet by mouth daily.    . Blood Glucose Calibration (FREESTYLE CONTROL SOLUTION) LIQD     . [START ON 10/26/2018] clopidogrel (PLAVIX) 75 MG tablet Take 1 tablet (75 mg total) by mouth daily for 21 days. 21 tablet 0  . diphenhydrAMINE (BENADRYL) 25 MG tablet Take 25 mg by mouth 3 (three) times daily.     . fluticasone (FLONASE) 50 MCG/ACT nasal spray Place 1 spray into both nostrils daily as needed for allergies or rhinitis.    Marland Kitchen. FREESTYLE LITE test strip     . Insulin Glargine (BASAGLAR KWIKPEN) 100 UNIT/ML SOPN Inject 35 Units into the skin at bedtime.     . Lancets (FREESTYLE) lancets     . losartan (COZAAR) 100 MG tablet Take 100 mg by mouth daily.     . metFORMIN (GLUCOPHAGE) 1000 MG tablet Take 1,000 mg by mouth 2 (two) times daily with a meal.     . methocarbamol (ROBAXIN) 500 MG tablet Take 500 mg by mouth 2 (two) times daily as needed for muscle spasms. Script states "not meant for daily use"    . Multiple Vitamin (MULTIVITAMIN WITH MINERALS) TABS tablet Take 1 tablet by mouth daily.    Marland Kitchen. OVER THE COUNTER MEDICATION Apply 1 application topically daily. Over the counter pain cream "Hemma" sp?    . polyethylene glycol (MIRALAX / GLYCOLAX) 17 g packet Take 17 g by mouth daily as needed. 14 each 0  . traZODone (DESYREL) 100 MG tablet Take 100 mg by mouth at bedtime.       Drug Regimen Review   Drug regimen was reviewed and remains appropriate with no significant issues identified  Home: Home Living Family/patient expects to be discharged to:: Private residence Living Arrangements: (God daughter, Forensic psychologistherice, lives with patient) Available Help at Discharge: Family, Available 24 hours/day Type of Home: House Home Access: Stairs to enter Secretary/administratorntrance Stairs-Number of Steps: 3 Entrance Stairs-Rails: None Home Layout: One level Bathroom Shower/Tub: Tub/shower unit, Engineer, building servicesCurtain Bathroom Toilet: Pharmacist, communitytandard Bathroom Accessibility: Yes Home Equipment: Medical laboratory scientific officerCane - single point  Lives With: (God daughter lives with patient for past year)   Functional History: Prior Function Level of Independence: Independent with assistive device(s) Comments: SPC, drives  Functional Status:  Mobility: Bed Mobility Overal bed mobility: Needs Assistance Bed Mobility: Supine to Sit Supine to sit: Supervision General bed mobility comments: pt OOB in recliner chair upon arrival Transfers Overall transfer level: Needs assistance Equipment used: None Transfers: Sit to/from Stand Sit to Stand: Min guard General transfer comment: good technique, use of momentum, min guard for safety Ambulation/Gait Ambulation/Gait assistance: Min guard, Min assist Gait Distance (Feet): 100 Feet Assistive device: None, Rolling walker (2 wheeled) Gait Pattern/deviations: Step-to pattern, Step-through pattern, Decreased step length - right, Decreased stance time - left, Decreased stride  length, Decreased weight shift to left General Gait Details: pt with modest instability with fatigue of L LE, noted minor buckling of L LE, improved stability and gait pattern with RW Gait velocity: decreased    ADL: ADL Overall ADL's : Needs assistance/impaired Eating/Feeding: Modified independent, Sitting Eating/Feeding Details (indicate cue type and reason): increased time Grooming: Wash/dry hands, Minimal assistance, Standing Grooming Details  (indicate cue type and reason): at sink; increased time Upper Body Bathing: Minimal assistance, Sitting Upper Body Bathing Details (indicate cue type and reason): increased time Lower Body Bathing: Minimal assistance, Sit to/from stand Lower Body Bathing Details (indicate cue type and reason): increased time Upper Body Dressing : Minimal assistance, Sitting Upper Body Dressing Details (indicate cue type and reason): increased time Lower Body Dressing: Minimal assistance, Sit to/from stand Lower Body Dressing Details (indicate cue type and reason): increased time Toilet Transfer: Minimal assistance, Ambulation, BSC, RW Toilet Transfer Details (indicate cue type and reason): over toilet Toileting- Clothing Manipulation and Hygiene: Minimal assistance, Sit to/from stand  Cognition: Cognition Overall Cognitive Status: Impaired/Different from baseline Arousal/Alertness: Awake/alert Orientation Level: Oriented X4 Attention: Focused, Sustained Focused Attention: Appears intact Sustained Attention: Appears intact Memory: Impaired Memory Impairment: Decreased recall of new information, Decreased short term memory Decreased Short Term Memory: Verbal complex, Functional complex Problem Solving: Impaired Executive Function: Organizing, Self Monitoring Organizing: Impaired Self Monitoring: Impaired Cognition Arousal/Alertness: Awake/alert Behavior During Therapy: WFL for tasks assessed/performed Overall Cognitive Status: Impaired/Different from baseline Area of Impairment: Safety/judgement, Problem solving Safety/Judgement: Decreased awareness of deficits Problem Solving: Difficulty sequencing, Requires verbal cues  Physical Exam: Blood pressure (!) 170/69, pulse 85, temperature 98.8 F (37.1 C), temperature source Oral, resp. rate 16, height 5\' 7"  (1.702 m), weight 93 kg, SpO2 98 %. Physical Exam  Vitals reviewed. Constitutional: She appears well-developed.  Obese  HENT:  Head:  Normocephalic and atraumatic.  Eyes: EOM are normal. Right eye exhibits no discharge. Left eye exhibits no discharge.  Respiratory: Effort normal. No respiratory distress.  GI: She exhibits no distension.  Musculoskeletal:     Comments: No edema or tenderness in extremities  Neurological: She is alert.  Patient alert and oriented to person place and time. Follows commands.  Fair awareness of deficits.  Makes good eye contact with examiner. Motor: Right upper extremity/right lower extremity: 5/5 proximal distal Left upper extremity: 4 medicine/5 proximal distal Left lower extremity: 3-/5 proximal distal Dysarthria Facial weakness  Skin: Skin is warm and dry.  Psychiatric: She has a normal mood and affect. Her behavior is normal.    Results for orders placed or performed during the hospital encounter of 10/22/18 (from the past 48 hour(s))  Glucose, capillary     Status: Abnormal   Collection Time: 10/23/18  9:08 PM  Result Value Ref Range   Glucose-Capillary 192 (H) 70 - 99 mg/dL  Glucose, capillary     Status: None   Collection Time: 10/24/18  6:58 AM  Result Value Ref Range   Glucose-Capillary 97 70 - 99 mg/dL  Glucose, capillary     Status: Abnormal   Collection Time: 10/24/18  1:35 PM  Result Value Ref Range   Glucose-Capillary 114 (H) 70 - 99 mg/dL  Glucose, capillary     Status: Abnormal   Collection Time: 10/24/18  9:19 PM  Result Value Ref Range   Glucose-Capillary 177 (H) 70 - 99 mg/dL  CBC     Status: Abnormal   Collection Time: 10/25/18  5:02 AM  Result Value Ref Range  WBC 5.5 4.0 - 10.5 K/uL   RBC 3.26 (L) 3.87 - 5.11 MIL/uL   Hemoglobin 9.2 (L) 12.0 - 15.0 g/dL   HCT 16.1 (L) 09.6 - 04.5 %   MCV 91.7 80.0 - 100.0 fL   MCH 28.2 26.0 - 34.0 pg   MCHC 30.8 30.0 - 36.0 g/dL   RDW 40.9 81.1 - 91.4 %   Platelets 200 150 - 400 K/uL   nRBC 0.0 0.0 - 0.2 %    Comment: Performed at [facility], [address] [place], [place] [address]  Basic metabolic  panel     Status: Abnormal   Collection Time: 10/25/18  5:02 AM  Result Value Ref Range   Sodium 141 135 - 145 mmol/L   Potassium 2.8 (L) 3.5 - 5.1 mmol/L   Chloride 104 98 - 111 mmol/L   CO2 26 22 - 32 mmol/L   Glucose, Bld 132 (H) 70 - 99 mg/dL   BUN 16 8 - 23 mg/dL   Creatinine, Ser 6.21 (H) 0.44 - 1.00 mg/dL   Calcium 7.4 (L) 8.9 - 10.3 mg/dL   GFR calc non Af Amer 48 (L) >60 mL/min   GFR calc Af Amer 56 (L) >60 mL/min   Anion gap 11 5 - 15    Comment: Performed at [facility], [address] [place], [place] [address]  Glucose, capillary     Status: Abnormal   Collection Time: 10/25/18  6:28 AM  Result Value Ref Range   Glucose-Capillary 121 (H) 70 - 99 mg/dL  Glucose, capillary     Status: Abnormal   Collection Time: 10/25/18  1:15 PM  Result Value Ref Range   Glucose-Capillary 180 (H) 70 - 99 mg/dL   No results found.     Medical Problem List and Plan: 1.  Leftside weakness secondary to right MCA cortical and subcortical infarct  Went to CIR 2.  Antithrombotics: -DVT/anticoagulation:  Lovenox  -antiplatelet therapy: aspirin 81 mg daily and Plavix 75 mg daily 3 weeks then aspirin alone 3. Pain Management: Robaxin 500 mg daily 4. Mood:  Trazodone 100 mg daily at bedtime, Xanax 0.5 mg daily as needed  -antipsychotic agents: N/A 5. Neuropsych: This patient is capable of making decisions on her own behalf. 6. Skin/Wound Care:  Routine skin checks 7. Fluids/Electrolytes/Nutrition:  Routine in and out's with follow-up BMP tomorrow a.m. 8. Hypertension. Permissive hypertension. Patient on Cozaar 100 mg daily,HCTZ 25 mg daily prior to admission. Resume as needed.  Monitor with increased mobility. 9. Diabetes mellitus peripheral neuropathy. Hemoglobin A1c 9.0.Lantus insulin 30 units daily. Check blood sugars before meals and at bedtime.  Monitor with increased mobility. 10. History of tobacco use 12 years ago as well as marijuana use. Urine drug screen not performed  on this latest admission. 11. Obesity. BMI 32.11. Dietary follow-up 12. Hyperlipidemia. Lipitor 13.  Hypokalemia: BMP ordered for tomorrow morning.  Mcarthur Rossetti Angiulli, PA-C 10/25/2018

## 2018-10-24 NOTE — Progress Notes (Signed)
  Speech Language Pathology Treatment: Cognitive-Linquistic  Patient Details Name: Cynthia Hunt MRN: [id number] DOB: Mar 12, 1946 Today's Date: 10/24/2018 Time: 1207-1230 SLP Time Calculation (min) (ACUTE ONLY): 23 min  Assessment / Plan / Recommendation Clinical Impression  Pt was seen for treatment and demonstrated improvement compared to that which was observed during the initial evaluation. She was able to verbalize the events from her day and her plan of care concerning today's scheduled tests. She demonstrated 100% accuracy with immediate 5-item recall independently and 80% accuracy with a medication management (prescription) task increasing to 100% accuracy with min. cues. With time management problems she achieved 60% accuracy increasing to 100% accuracy with min-mod cues. She demonstrated 80% accuracy with abstract reasoning tasks increasing to 100% accuracy with min cues. SLP will continue to follow pt.    HPI HPI: Cynthia Hunt is a 73 y.o. female with medical history significant of HTN; HLD: DM; and depression presenting with stroke-like symptoms. 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 Vear Clock, MS, CCC-SLP Acute Rehabilitation Services Office number [phone] Pager [phone]               Scheryl Marten 10/24/2018, 12:33 PM

## 2018-10-24 NOTE — Progress Notes (Signed)
STROKE TEAM PROGRESS NOTE   INTERVAL HISTORY She has no complaints today.  She is awaiting TEE later today.  Vitals:   10/23/18 2316 10/24/18 0334 10/24/18 0825 10/24/18 1106  BP: (!) 190/83 135/84 (!) 198/98 (!) 189/85  Pulse: 82 76 82 84  Resp: '15 15 18 18  '$ Temp: 98.5 F (36.9 C) 98.3 F (36.8 C) (!) 97.5 F (36.4 C) 98.1 F (36.7 C)  TempSrc: Oral Oral Oral Oral  SpO2: 96% 100% 100% 100%  Weight:      Height:        CBC:  Recent Labs  Lab 10/22/18 0755  WBC 7.4  NEUTROABS 6.1  HGB 10.5*  HCT 35.0*  MCV 94.3  PLT 277    Basic Metabolic Panel:  Recent Labs  Lab 10/22/18 0755 10/22/18 0759  NA 140  --   K 3.4*  --   CL 100  --   CO2 22  --   GLUCOSE 175*  --   BUN 18  --   CREATININE 1.34* 1.10*  CALCIUM 8.2*  --    Lipid Panel:     Component Value Date/Time   CHOL 136 10/23/2018 0604   TRIG 71 10/23/2018 0604   HDL 41 10/23/2018 0604   CHOLHDL 3.3 10/23/2018 0604   VLDL 14 10/23/2018 0604   LDLCALC 81 10/23/2018 0604   HgbA1c:  Lab Results  Component Value Date   HGBA1C 9.0 (H) 10/23/2018   Urine Drug Screen: No results found for: LABOPIA, COCAINSCRNUR, LABBENZ, AMPHETMU, THCU, LABBARB  Alcohol Level     Component Value Date/Time   [facility] <10 10/22/2018 0755    IMAGING Mr Jodene Nam Head Wo Contrast  Result Date: 10/22/2018 CLINICAL DATA:  Stroke-like symptoms. Slipped off bed, and could not get off floor. Reportedly, LEFT hemiparesis for 2 days, but patient did not want to come to hospital. Stroke risk factors include uncontrolled type 2 diabetes mellitus, essential hypertension and dyslipidemia. EXAM: MR HEAD WITHOUT CONTRAST MR CIRCLE OF WILLIS WITHOUT CONTRAST MRA OF THE NECK WITHOUT AND WITH CONTRAST TECHNIQUE: Multiplanar, multiecho pulse sequences of the brain, circle of Willis and surrounding structures were obtained without intravenous contrast. Angiographic images of the neck were obtained using MRA technique without and with intravenous  contrast. CONTRAST:  Gadavist 10 mL. COMPARISON:  Noncontrast CT head earlier today. FINDINGS: MR HEAD FINDINGS Brain: Multifocal areas of restricted diffusion, corresponding low ADC, affect the RIGHT frontal cortex, posterior frontal cortex, and subcortical white matter consistent with acute nonhemorrhagic infarcts. One area of acute infarction, roughly 8 mm in diameter, corresponds with the abnormality noted on CT earlier today. The second area questioned on earlier CT, in the RIGHT basal ganglia, represents an area of chronic infarction. No mass lesion, hydrocephalus, or extra-axial fluid. Generalized atrophy. Moderately advanced T2 and FLAIR hyperintensities throughout the white matter, likely small vessel disease. Vascular: Reported separately. Skull and upper cervical spine: No acute findings. Partial empty sella. Sinuses/Orbits: Mucosal thickening.  Negative orbits. Other: None. MR CIRCLE OF WILLIS FINDINGS RIGHT ICA: Nonstenotic irregularity in its cavernous segment. Otherwise widely patent. M1 MCA widely patent. A1 ACA widely patent. High-grade 75% or greater stenosis of the superior division M2 MCA on the RIGHT. See series 5 image 122. This is likely contributory to the observed pattern of infarction. LEFT ICA: 50% cavernous stenosis. 50-75% stenosis at the junction of the cavernous and supraclinoid ICA. LEFT M1 MCA widely patent. 50% stenosis origin LEFT A1 ACA. Nonstenotic irregularity LEFT M2 vessels. Distal anterior  cerebral arteries are mildly irregular but patent. Basilar artery mildly irregular but patent. RIGHT vertebral dominant contributor. LEFT vertebral diminutive but patent. Fetal origin LEFT PCA. RIGHT PCA widely patent. No cerebellar branch occlusion. No saccular aneurysm. MRA NECK FINDINGS Dolichoectatic great vessels, but no proximal stenosis. Nonstenotic irregularity RIGHT carotid bifurcation. Nonstenotic irregularity LEFT carotid bifurcation. Both vertebral arteries are patent without  significant ostial stenosis, RIGHT dominant. IMPRESSION: Multifocal areas of acute infarction, RIGHT MCA territory, affect the RIGHT frontal and posterior frontal cortex and subcortical white matter. High-grade stenosis of the RIGHT M2 MCA superior division is likely contributory to the observed pattern of ischemia. 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 No intracranial hemorrhage or mass. 50-75% LEFT ICA cavernous/supraclinoid stenosis; otherwise, no proximal flow-limiting stenosis of the carotid or basilar arteries. Extracranial vasculature is dolichoectatic without flow-limiting stenosis, dissection, or occlusion. Minor atheromatous change both carotid bifurcations. Electronically Signed   By: Staci Righter M.D.   On: 10/22/2018 14:22   Mr Angiogram Neck W Or Wo Contrast  Result Date: 10/22/2018 CLINICAL DATA:  Stroke-like symptoms. Slipped off bed, and could not get off floor. Reportedly, LEFT hemiparesis for 2 days, but patient did not want to come to hospital. Stroke risk factors include uncontrolled type 2 diabetes mellitus, essential hypertension and dyslipidemia. EXAM: MR HEAD WITHOUT CONTRAST MR CIRCLE OF WILLIS WITHOUT CONTRAST MRA OF THE NECK WITHOUT AND WITH CONTRAST TECHNIQUE: Multiplanar, multiecho pulse sequences of the brain, circle of Willis and surrounding structures were obtained without intravenous contrast. Angiographic images of the neck were obtained using MRA technique without and with intravenous contrast. CONTRAST:  Gadavist 10 mL. COMPARISON:  Noncontrast CT head earlier today. FINDINGS: MR HEAD FINDINGS Brain: Multifocal areas of restricted diffusion, corresponding low ADC, affect the RIGHT frontal cortex, posterior frontal cortex, and subcortical white matter consistent with acute nonhemorrhagic infarcts. One area of acute infarction, roughly 8 mm in diameter, corresponds with the abnormality noted on CT earlier today. The second  area questioned on earlier CT, in the RIGHT basal ganglia, represents an area of chronic infarction. No mass lesion, hydrocephalus, or extra-axial fluid. Generalized atrophy. Moderately advanced T2 and FLAIR hyperintensities throughout the white matter, likely small vessel disease. Vascular: Reported separately. Skull and upper cervical spine: No acute findings. Partial empty sella. Sinuses/Orbits: Mucosal thickening.  Negative orbits. Other: None. MR CIRCLE OF WILLIS FINDINGS RIGHT ICA: Nonstenotic irregularity in its cavernous segment. Otherwise widely patent. M1 MCA widely patent. A1 ACA widely patent. High-grade 75% or greater stenosis of the superior division M2 MCA on the RIGHT. See series 5 image 122. This is likely contributory to the observed pattern of infarction. LEFT ICA: 50% cavernous stenosis. 50-75% stenosis at the junction of the cavernous and supraclinoid ICA. LEFT M1 MCA widely patent. 50% stenosis origin LEFT A1 ACA. Nonstenotic irregularity LEFT M2 vessels. Distal anterior cerebral arteries are mildly irregular but patent. Basilar artery mildly irregular but patent. RIGHT vertebral dominant contributor. LEFT vertebral diminutive but patent. Fetal origin LEFT PCA. RIGHT PCA widely patent. No cerebellar branch occlusion. No saccular aneurysm. MRA NECK FINDINGS Dolichoectatic great vessels, but no proximal stenosis. Nonstenotic irregularity RIGHT carotid bifurcation. Nonstenotic irregularity LEFT carotid bifurcation. Both vertebral arteries are patent without significant ostial stenosis, RIGHT dominant. IMPRESSION: Multifocal areas of acute infarction, RIGHT MCA territory, affect the RIGHT frontal and posterior frontal cortex and subcortical white matter. High-grade stenosis of the RIGHT M2 MCA superior division is likely contributory to the observed pattern of ischemia. Atrophy  and small vessel disease with evidence of chronic infarction RIGHT basal ganglia. No intracranial hemorrhage or mass.  50-75% LEFT ICA cavernous/supraclinoid stenosis; otherwise, no proximal flow-limiting stenosis of the carotid or basilar arteries. Extracranial vasculature is dolichoectatic without flow-limiting stenosis, dissection, or occlusion. Minor atheromatous change both carotid bifurcations. Electronically Signed   By: Staci Righter M.D.   On: 10/22/2018 14:22   Mr Brain Wo Contrast  Result Date: 10/22/2018 CLINICAL DATA:  Stroke-like symptoms. Slipped off bed, and could not get off floor. Reportedly, LEFT hemiparesis for 2 days, but patient did not want to come to hospital. Stroke risk factors include uncontrolled type 2 diabetes mellitus, essential hypertension and dyslipidemia. EXAM: MR HEAD WITHOUT CONTRAST MR CIRCLE OF WILLIS WITHOUT CONTRAST MRA OF THE NECK WITHOUT AND WITH CONTRAST TECHNIQUE: Multiplanar, multiecho pulse sequences of the brain, circle of Willis and surrounding structures were obtained without intravenous contrast. Angiographic images of the neck were obtained using MRA technique without and with intravenous contrast. CONTRAST:  Gadavist 10 mL. COMPARISON:  Noncontrast CT head earlier today. FINDINGS: MR HEAD FINDINGS Brain: Multifocal areas of restricted diffusion, corresponding low ADC, affect the RIGHT frontal cortex, posterior frontal cortex, and subcortical white matter consistent with acute nonhemorrhagic infarcts. One area of acute infarction, roughly 8 mm in diameter, corresponds with the abnormality noted on CT earlier today. The second area questioned on earlier CT, in the RIGHT basal ganglia, represents an area of chronic infarction. No mass lesion, hydrocephalus, or extra-axial fluid. Generalized atrophy. Moderately advanced T2 and FLAIR hyperintensities throughout the white matter, likely small vessel disease. Vascular: Reported separately. Skull and upper cervical spine: No acute findings. Partial empty sella. Sinuses/Orbits: Mucosal thickening.  Negative orbits. Other: None. MR  CIRCLE OF WILLIS FINDINGS RIGHT ICA: Nonstenotic irregularity in its cavernous segment. Otherwise widely patent. M1 MCA widely patent. A1 ACA widely patent. High-grade 75% or greater stenosis of the superior division M2 MCA on the RIGHT. See series 5 image 122. This is likely contributory to the observed pattern of infarction. LEFT ICA: 50% cavernous stenosis. 50-75% stenosis at the junction of the cavernous and supraclinoid ICA. LEFT M1 MCA widely patent. 50% stenosis origin LEFT A1 ACA. Nonstenotic irregularity LEFT M2 vessels. Distal anterior cerebral arteries are mildly irregular but patent. Basilar artery mildly irregular but patent. RIGHT vertebral dominant contributor. LEFT vertebral diminutive but patent. Fetal origin LEFT PCA. RIGHT PCA widely patent. No cerebellar branch occlusion. No saccular aneurysm. MRA NECK FINDINGS Dolichoectatic great vessels, but no proximal stenosis. Nonstenotic irregularity RIGHT carotid bifurcation. Nonstenotic irregularity LEFT carotid bifurcation. Both vertebral arteries are patent without significant ostial stenosis, RIGHT dominant. IMPRESSION: Multifocal areas of acute infarction, RIGHT MCA territory, affect the RIGHT frontal and posterior frontal cortex and subcortical white matter. High-grade stenosis of the RIGHT M2 MCA superior division is likely contributory to the observed pattern of ischemia. 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 No intracranial hemorrhage or mass. 50-75% LEFT ICA cavernous/supraclinoid stenosis; otherwise, no proximal flow-limiting stenosis of the carotid or basilar arteries. Extracranial vasculature is dolichoectatic without flow-limiting stenosis, dissection, or occlusion. Minor atheromatous change both carotid bifurcations. Electronically Signed   By: Staci Righter M.D.   On: 10/22/2018 14:22   2D echo 1. The aortic valve is abnormal. Aortic valve regurgitation is trivial by color flow  Doppler. There is leaflet thickening along the coaptation points centrally. Cannot exclude aortic valve vegetations. Differential diagnosis includes nonbacterial  thrombotic endocarditis, infective endocarditis, papillary fibroelastoma, or valve degeneration.  Consider cardiology consultation with consideration for further imaging if clinically indicated in the setting of stroke.  2. The left ventricle has normal systolic function, with an ejection fraction of 55-60%. The cavity size was normal. There is mildly increased left ventricular wall thickness.  3. The right ventricle has normal systolc function. The cavity was normal. There is no increase in right ventricular wall thickness.  4. Mild thickening of the mitral valve leaflet.  5. The inferior vena cava was normal in size with <50% respiratory variability.   PHYSICAL EXAM  Pleasant elderly African-American lady currently not in distress. . Afebrile. Head is nontraumatic. Neck is supple without bruit.    Cardiac exam no murmur or gallop. Lungs are clear to auscultation. Distal pulses are well felt. Neurological Exam :  Awake alert oriented x3 with normal speech and language function.  There is no aphasia apraxia or dysarthria.  Extraocular movements are full range without nystagmus.  Visual fields seem full to bedside confrontation testing.  Mild left lower facial weakness.  Tongue midline.  Motor system exam shows mild left hemiparesis with weakness of left grip and intrinsic hand muscles.  Orbits right over left upper extremity.  Trace weakness of left hip flexors and ankle dorsiflexors only.  Sensation is intact.  Coordination is slow but accurate.  Gait not tested.   ASSESSMENT/PLAN Cynthia Hunt is a 73 y.o. female with history of HTN, HLD, DM, AKI, remote tobacco use, depression presenting with left-sided weakness x2 days and headache in setting of hypertensive urgency.   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  MRI R MCA R frontal and posterior frontal cortex and subcortical infarcts.  MRA head high-grade stenosis R M2 superior division.  L ICA 50 to 75% stenosis.  MRA neck dolichoectatic   Carotid Doppler pending  2D Echo abnormal aortic valve, cannot exclude vegetations.  EF 55 to 60%.  TEE  pending  LDL 81  HgbA1c 9.0  Lovenox 40 mg sq daily for VTE prophylaxis  No antithrombotic prior to admission, now on aspirin 81 mg daily and clopidogrel 75 mg daily continue x 3 weeks then aspirin alone.   Therapy recommendations: CIR  Disposition: Pending  Hypertensive urgency  Systolic blood pressure on admission 180-200s  . Permissive hypertension (OK if < 220/120) but gradually normalize in 5-7 days . Within limits now . Long-term BP goal normotensive  Hyperlipidemia  Home meds: Lipitor 40, resumed in hospital  LDL 81, goal < 70  Continue statin at discharge  Diabetes type II Uncontrolled  HgbA1c 9.0, goal < 7.0  Abnormal aortic valve, ? Abnormal MV   Cardiology consulted  TEE tomorrow for further evaluation  BCx no growth to date   ESR 55  Afebrile   Other Stroke Risk Factors  Advanced age  Former cigarette smoker, quit 12 years ago advised to stop smoking  History of marijuana use.  UDS not performed this admission  ETOH use, advised to drink no more than 1 drink(s) a day  Obesity, Body mass index is 32.11 kg/m., recommend weight loss, diet and exercise as appropriate   Hospital day # 2  Plan check  TEE.  Continue antiplatelet therapy for now.  Aggressive risk factor modification.    Antony Contras, MD Medical Director [facility] Pager: [phone] 10/24/2018 1:12 PM   To contact  Stroke Continuity provider, please refer to [email or web address] After hours, contact General Neurology

## 2018-10-24 NOTE — Progress Notes (Signed)
Progress Note  Patient Name: Cynthia Hunt Date of Encounter: 10/24/2018  Primary Cardiologist: New-Dr. Donato Schultz, MD   Subjective   Going for TEE today.  Discussed.  Ate breakfast.  Plan at 2:00.  Inpatient Medications    Scheduled Meds:   stroke: mapping our early stages of recovery book   Does not apply Once   aspirin EC  81 mg Oral Daily   atorvastatin  40 mg Oral q1800   clopidogrel  75 mg Oral Daily   enoxaparin (LOVENOX) injection  40 mg Subcutaneous Q24H   insulin aspart  0-15 Units Subcutaneous TID WC   insulin aspart  0-5 Units Subcutaneous QHS   insulin glargine  30 Units Subcutaneous QHS   methocarbamol  500 mg Oral Daily   traZODone  100 mg Oral QHS   Continuous Infusions:  sodium chloride 50 mL/hr at 10/22/18 1115   PRN Meds: acetaminophen **OR** acetaminophen (TYLENOL) oral liquid 160 mg/5 mL **OR** acetaminophen, ALPRAZolam, senna-docusate   Vital Signs    Vitals:   10/23/18 1535 10/23/18 1928 10/23/18 2316 10/24/18 0334  BP: (!) 148/77 (!) 154/78 (!) 190/83 135/84  Pulse: 91 80 82 76  Resp: Temp: 98.3 F (36.8 C) 98.4 F (36.9 C) 98.5 F (36.9 C) 98.3 F (36.8 C)  TempSrc: Oral Oral Oral Oral  SpO2: 94% 100% 96% 100%  Weight:      Height:        Intake/Output Summary (Last 24 hours) at 10/24/2018 0814 Last data filed at 10/23/2018 2100 Gross per 24 hour  Intake 240 ml  Output --  Net 240 ml   Filed Weights   10/22/18 0752  Weight: 93 kg    Physical Exam   GEN: Well nourished, well developed, in no acute distress.  HEENT: Grossly normal.  Neck: Supple, no JVD, carotid bruits, or masses. Cardiac: RRR, no murmurs, rubs, or gallops. No clubbing, cyanosis, edema.  Radials/DP/PT 2+ and equal bilaterally.  Respiratory:  Respirations regular and unlabored, clear to auscultation bilaterally. GI: Soft, nontender, nondistended, BS + x 4. MS: no deformity or atrophy. Skin: warm and dry, no rash. Neuro:   Strength and sensation are intact. Psych: AAOx3.  Normal affect.  Labs    Chemistry Recent Labs  Lab 10/22/18 0755 10/22/18 0759  NA 140  --   K 3.4*  --   CL 100  --   CO2 22  --   GLUCOSE 175*  --   BUN 18  --   CREATININE 1.34* 1.10*  CALCIUM 8.2*  --   PROT 7.7  --   ALBUMIN 3.5  --   AST 22  --   ALT 20  --   ALKPHOS 49  --   BILITOT 0.8  --   GFRNONAA 39*  --   GFRAA 46*  --   ANIONGAP 18*  --      Hematology Recent Labs  Lab 10/22/18 0755  WBC 7.4  RBC 3.71*  HGB 10.5*  HCT 35.0*  MCV 94.3  MCH 28.3  MCHC 30.0  RDW 13.6  PLT 238    Cardiac EnzymesNo results for input(s): TROPONINI in the last 168 hours. No results for input(s): TROPIPOC in the last 168 hours.   BNPNo results for input(s): BNP, PROBNP in the last 168 hours.   DDimer No results for input(s): DDIMER in the last 168 hours.   Radiology    Ct Head Wo Contrast  Result  Date: 10/22/2018 CLINICAL DATA:  Increasing LEFT-sided weakness. Symptoms began 2 days ago. EXAM: CT HEAD WITHOUT CONTRAST TECHNIQUE: Contiguous axial images were obtained from the base of the skull through the vertex without intravenous contrast. COMPARISON:  No priors for comparison. FINDINGS: Brain: Age-indeterminate hypodensity of RIGHT basal ganglia, between the caudate and lentiform nucleus, could represent developing acute infarction. Similarly, asymmetric hypoattenuation of the RIGHT centrum semiovale, could be acute or chronic. No definite acute cortical hypodensities. No hemorrhage, mass lesion, hydrocephalus, or extra-axial fluid. Mild cerebral and cerebellar atrophy. Generalized white matter hypoattenuation could represent small vessel disease. Vascular: Calcification of the cavernous internal carotid arteries consistent with cerebrovascular atherosclerotic disease. No signs of intracranial large vessel occlusion. Skull: Intact. Sinuses/Orbits: No acute sinus findings. Conjugate gaze to the RIGHT. Other: None.  IMPRESSION: 1. Age-indeterminate RIGHT basal ganglia and RIGHT centrum semiovale hypodensities could represent acute or chronic infarction. 2. If no contraindications, MRI of the brain without contrast recommended for further evaluation. Electronically Signed   By: Elsie Stain M.D.   On: 10/22/2018 08:28   Mr [name] Head Wo Contrast  Result Date: 10/22/2018 CLINICAL DATA:  Stroke-like symptoms. Slipped off bed, and could not get off floor. Reportedly, LEFT hemiparesis for 2 days, but patient did not want to come to hospital. Stroke risk factors include uncontrolled type 2 diabetes mellitus, essential hypertension and dyslipidemia. EXAM: MR HEAD WITHOUT CONTRAST MR CIRCLE OF WILLIS WITHOUT CONTRAST MRA OF THE NECK WITHOUT AND WITH CONTRAST TECHNIQUE: Multiplanar, multiecho pulse sequences of the brain, circle of Willis and surrounding structures were obtained without intravenous contrast. Angiographic images of the neck were obtained using MRA technique without and with intravenous contrast. CONTRAST:  Gadavist 10 mL. COMPARISON:  Noncontrast CT head earlier today. FINDINGS: MR HEAD FINDINGS Brain: Multifocal areas of restricted diffusion, corresponding low ADC, affect the RIGHT frontal cortex, posterior frontal cortex, and subcortical white matter consistent with acute nonhemorrhagic infarcts. One area of acute infarction, roughly 8 mm in diameter, corresponds with the abnormality noted on CT earlier today. The second area questioned on earlier CT, in the RIGHT basal ganglia, represents an area of chronic infarction. No mass lesion, hydrocephalus, or extra-axial fluid. Generalized atrophy. Moderately advanced T2 and FLAIR hyperintensities throughout the white matter, likely small vessel disease. Vascular: Reported separately. Skull and upper cervical spine: No acute findings. Partial empty sella. Sinuses/Orbits: Mucosal thickening.  Negative orbits. Other: None. MR CIRCLE OF WILLIS FINDINGS RIGHT ICA: Nonstenotic  irregularity in its cavernous segment. Otherwise widely patent. M1 MCA widely patent. A1 ACA widely patent. High-grade 75% or greater stenosis of the superior division M2 MCA on the RIGHT. See series 5 image 122. This is likely contributory to the observed pattern of infarction. LEFT ICA: 50% cavernous stenosis. 50-75% stenosis at the junction of the cavernous and supraclinoid ICA. LEFT M1 MCA widely patent. 50% stenosis origin LEFT A1 ACA. Nonstenotic irregularity LEFT M2 vessels. Distal anterior cerebral arteries are mildly irregular but patent. Basilar artery mildly irregular but patent. RIGHT vertebral dominant contributor. LEFT vertebral diminutive but patent. Fetal origin LEFT PCA. RIGHT PCA widely patent. No cerebellar branch occlusion. No saccular aneurysm. MRA NECK FINDINGS Dolichoectatic great vessels, but no proximal stenosis. Nonstenotic irregularity RIGHT carotid bifurcation. Nonstenotic irregularity LEFT carotid bifurcation. Both vertebral arteries are patent without significant ostial stenosis, RIGHT dominant. IMPRESSION: Multifocal areas of acute infarction, RIGHT MCA territory, affect the RIGHT frontal and posterior frontal cortex and subcortical white matter. High-grade stenosis of the RIGHT M2 MCA superior division is likely contributory  to the observed pattern of ischemia. 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 No intracranial hemorrhage or mass. 50-75% LEFT ICA cavernous/supraclinoid stenosis; otherwise, no proximal flow-limiting stenosis of the carotid or basilar arteries. Extracranial vasculature is dolichoectatic without flow-limiting stenosis, dissection, or occlusion. Minor atheromatous change both carotid bifurcations. Electronically Signed   By: Elsie Stain M.D.   On: 10/22/2018 14:22   Mr Angiogram Neck W Or Wo Contrast  Result Date: 10/22/2018 CLINICAL DATA:  Stroke-like symptoms. Slipped off bed, and could not get off floor.  Reportedly, LEFT hemiparesis for 2 days, but patient did not want to come to hospital. Stroke risk factors include uncontrolled type 2 diabetes mellitus, essential hypertension and dyslipidemia. EXAM: MR HEAD WITHOUT CONTRAST MR CIRCLE OF WILLIS WITHOUT CONTRAST MRA OF THE NECK WITHOUT AND WITH CONTRAST TECHNIQUE: Multiplanar, multiecho pulse sequences of the brain, circle of Willis and surrounding structures were obtained without intravenous contrast. Angiographic images of the neck were obtained using MRA technique without and with intravenous contrast. CONTRAST:  Gadavist 10 mL. COMPARISON:  Noncontrast CT head earlier today. FINDINGS: MR HEAD FINDINGS Brain: Multifocal areas of restricted diffusion, corresponding low ADC, affect the RIGHT frontal cortex, posterior frontal cortex, and subcortical white matter consistent with acute nonhemorrhagic infarcts. One area of acute infarction, roughly 8 mm in diameter, corresponds with the abnormality noted on CT earlier today. The second area questioned on earlier CT, in the RIGHT basal ganglia, represents an area of chronic infarction. No mass lesion, hydrocephalus, or extra-axial fluid. Generalized atrophy. Moderately advanced T2 and FLAIR hyperintensities throughout the white matter, likely small vessel disease. Vascular: Reported separately. Skull and upper cervical spine: No acute findings. Partial empty sella. Sinuses/Orbits: Mucosal thickening.  Negative orbits. Other: None. MR CIRCLE OF WILLIS FINDINGS RIGHT ICA: Nonstenotic irregularity in its cavernous segment. Otherwise widely patent. M1 MCA widely patent. A1 ACA widely patent. High-grade 75% or greater stenosis of the superior division M2 MCA on the RIGHT. See series 5 image 122. This is likely contributory to the observed pattern of infarction. LEFT ICA: 50% cavernous stenosis. 50-75% stenosis at the junction of the cavernous and supraclinoid ICA. LEFT M1 MCA widely patent. 50% stenosis origin LEFT A1 ACA.  Nonstenotic irregularity LEFT M2 vessels. Distal anterior cerebral arteries are mildly irregular but patent. Basilar artery mildly irregular but patent. RIGHT vertebral dominant contributor. LEFT vertebral diminutive but patent. Fetal origin LEFT PCA. RIGHT PCA widely patent. No cerebellar branch occlusion. No saccular aneurysm. MRA NECK FINDINGS Dolichoectatic great vessels, but no proximal stenosis. Nonstenotic irregularity RIGHT carotid bifurcation. Nonstenotic irregularity LEFT carotid bifurcation. Both vertebral arteries are patent without significant ostial stenosis, RIGHT dominant. IMPRESSION: Multifocal areas of acute infarction, RIGHT MCA territory, affect the RIGHT frontal and posterior frontal cortex and subcortical white matter. High-grade stenosis of the RIGHT M2 MCA superior division is likely contributory to the observed pattern of ischemia. 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 No intracranial hemorrhage or mass. 50-75% LEFT ICA cavernous/supraclinoid stenosis; otherwise, no proximal flow-limiting stenosis of the carotid or basilar arteries. Extracranial vasculature is dolichoectatic without flow-limiting stenosis, dissection, or occlusion. Minor atheromatous change both carotid bifurcations. Electronically Signed   By: Elsie Stain M.D.   On: 10/22/2018 14:22   Mr Brain Wo Contrast  Result Date: 10/22/2018 CLINICAL DATA:  Stroke-like symptoms. Slipped off bed, and could not get off floor. Reportedly, LEFT hemiparesis for 2 days, but patient did not want to come to  hospital. Stroke risk factors include uncontrolled type 2 diabetes mellitus, essential hypertension and dyslipidemia. EXAM: MR HEAD WITHOUT CONTRAST MR CIRCLE OF WILLIS WITHOUT CONTRAST MRA OF THE NECK WITHOUT AND WITH CONTRAST TECHNIQUE: Multiplanar, multiecho pulse sequences of the brain, circle of Willis and surrounding structures were obtained without intravenous contrast.  Angiographic images of the neck were obtained using MRA technique without and with intravenous contrast. CONTRAST:  Gadavist 10 mL. COMPARISON:  Noncontrast CT head earlier today. FINDINGS: MR HEAD FINDINGS Brain: Multifocal areas of restricted diffusion, corresponding low ADC, affect the RIGHT frontal cortex, posterior frontal cortex, and subcortical white matter consistent with acute nonhemorrhagic infarcts. One area of acute infarction, roughly 8 mm in diameter, corresponds with the abnormality noted on CT earlier today. The second area questioned on earlier CT, in the RIGHT basal ganglia, represents an area of chronic infarction. No mass lesion, hydrocephalus, or extra-axial fluid. Generalized atrophy. Moderately advanced T2 and FLAIR hyperintensities throughout the white matter, likely small vessel disease. Vascular: Reported separately. Skull and upper cervical spine: No acute findings. Partial empty sella. Sinuses/Orbits: Mucosal thickening.  Negative orbits. Other: None. MR CIRCLE OF WILLIS FINDINGS RIGHT ICA: Nonstenotic irregularity in its cavernous segment. Otherwise widely patent. M1 MCA widely patent. A1 ACA widely patent. High-grade 75% or greater stenosis of the superior division M2 MCA on the RIGHT. See series 5 image 122. This is likely contributory to the observed pattern of infarction. LEFT ICA: 50% cavernous stenosis. 50-75% stenosis at the junction of the cavernous and supraclinoid ICA. LEFT M1 MCA widely patent. 50% stenosis origin LEFT A1 ACA. Nonstenotic irregularity LEFT M2 vessels. Distal anterior cerebral arteries are mildly irregular but patent. Basilar artery mildly irregular but patent. RIGHT vertebral dominant contributor. LEFT vertebral diminutive but patent. Fetal origin LEFT PCA. RIGHT PCA widely patent. No cerebellar branch occlusion. No saccular aneurysm. MRA NECK FINDINGS Dolichoectatic great vessels, but no proximal stenosis. Nonstenotic irregularity RIGHT carotid bifurcation.  Nonstenotic irregularity LEFT carotid bifurcation. Both vertebral arteries are patent without significant ostial stenosis, RIGHT dominant. IMPRESSION: Multifocal areas of acute infarction, RIGHT MCA territory, affect the RIGHT frontal and posterior frontal cortex and subcortical white matter. High-grade stenosis of the RIGHT M2 MCA superior division is likely contributory to the observed pattern of ischemia. 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 No intracranial hemorrhage or mass. 50-75% LEFT ICA cavernous/supraclinoid stenosis; otherwise, no proximal flow-limiting stenosis of the carotid or basilar arteries. Extracranial vasculature is dolichoectatic without flow-limiting stenosis, dissection, or occlusion. Minor atheromatous change both carotid bifurcations. Electronically Signed   By: Elsie Stain M.D.   On: 10/22/2018 14:22   Vas US Carotid (at [facility])  Result Date: 10/23/2018 Carotid Arterial Duplex Study Indications: CVA. Performing Technologist: Blanch Media RVS  Examination Guidelines: A complete evaluation includes B-mode imaging, spectral Doppler, color Doppler, and power Doppler as needed of all accessible portions of each vessel. Bilateral testing is considered an integral part of a complete examination. Limited examinations for reoccurring indications may be performed as noted.  Right Carotid Findings: +----------+--------+--------+--------+------------+--------+             PSV cm/s EDV cm/s Stenosis Describe     Comments  +----------+--------+--------+--------+------------+--------+  CCA Prox   45                         heterogenous           +----------+--------+--------+--------+------------+--------+  CCA Distal 61  14                heterogenous           +----------+--------+--------+--------+------------+--------+  ICA Prox   75       25       1-39%    heterogenous            +----------+--------+--------+--------+------------+--------+  ICA Distal 59       19                                       +----------+--------+--------+--------+------------+--------+  ECA        113      6                                        +----------+--------+--------+--------+------------+--------+ +----------+--------+-------+--------+-------------------+             PSV cm/s EDV cms Describe Arm Pressure (mmHG)  +----------+--------+-------+--------+-------------------+  Subclavian 86                                             +----------+--------+-------+--------+-------------------+ +---------+--------+--+--------+--+---------+  Vertebral PSV cm/s 53 EDV cm/s 16 Antegrade  +---------+--------+--+--------+--+---------+  Left Carotid Findings: +----------+--------+--------+--------+------------+--------+             PSV cm/s EDV cm/s Stenosis Describe     Comments  +----------+--------+--------+--------+------------+--------+  CCA Prox   111      17                heterogenous           +----------+--------+--------+--------+------------+--------+  CCA Distal 66       14                heterogenous           +----------+--------+--------+--------+------------+--------+  ICA Prox   86       27       1-39%    heterogenous           +----------+--------+--------+--------+------------+--------+  ICA Distal 56       18                                       +----------+--------+--------+--------+------------+--------+  ECA        85       7                                        +----------+--------+--------+--------+------------+--------+ +----------+--------+--------+--------+-------------------+  Subclavian PSV cm/s EDV cm/s Describe Arm Pressure (mmHG)  +----------+--------+--------+--------+-------------------+             93                                              +----------+--------+--------+--------+-------------------+ +---------+--------+--+--------+--+---------+  Vertebral PSV cm/s 32 EDV  cm/s 10 Antegrade  +---------+--------+--+--------+--+---------+  Summary:   *See table(s) above for measurements and observations.  Electronically  signed by Delia Heady MD on 10/23/2018 at 1:47:46 PM.    Final    Telemetry    Sinus rhythm- Personally Reviewed  ECG    No new tracing as of 10/24/2018 - Personally Reviewed  Cardiac Studies   Echocardiogram 10/22/2018: 1. The aortic valve is abnormal. Aortic valve regurgitation is trivial by color flow Doppler. There is leaflet thickening along the coaptation points centrally. Cannot exclude aortic valve vegetations. Differential diagnosis includes nonbacterial  thrombotic endocarditis, infective endocarditis, papillary fibroelastoma, or valve degeneration. Consider cardiology consultation with consideration for further imaging if clinically indicated in the setting of stroke. 2. The left ventricle has normal systolic function, with an ejection fraction of 55-60%. The cavity size was normal. There is mildly increased left ventricular wall thickness. 3. The right ventricle has normal systolc function. The cavity was normal. There is no increase in right ventricular wall thickness. 4. Mild thickening of the mitral valve leaflet. 5. The inferior vena cava was normal in size with <50% respiratory variability.  Patient Profile     73 y.o. female with a hx of stroke who is being seen today for the evaluation of abnormal ECHO, aortic valve at the request of Dr. Pearlean Brownie.  Assessment & Plan    1.  Acute stroke with abnormal echocardiogram: -Plans today for TEE today with Dr. Anne Fu to further evaluate abnormal echocardiogram which was performed 10/22/2018 with aortic valve leaflet thickening with questionable aortic valve vegetations -Risk and benefit of TEE as well as procedure itself were re-reviewed including esophageal abrasion, perforation, hypotension and risk of aspiration.  She is agreeable to proceed with the procedure today -Further  plans will be determined by TEE results -Continue ASA 81, Plavix 75 per neurology   Signed, Georgie Chard NP-C [facility] Pager: [phone] 10/24/2018, 8:14 AM     For questions or updates, please contact   Please consult [email or web address] for contact info under Cardiology/STEMI.  Personally seen and examined. Agree with above.  Acute stroke with abnormal contours on aortic valve TEE.  Aspirin Plavix. Donato Schultz, MD

## 2018-10-24 NOTE — Interval H&P Note (Signed)
History and Physical Interval Note:  10/24/2018 1:50 PM  Cynthia Hunt  has presented today for surgery, with the diagnosis of stroke, r/o vegetation.  The various methods of treatment have been discussed with the patient and family. After consideration of risks, benefits and other options for treatment, the patient has consented to  Procedure(s): TRANSESOPHAGEAL ECHOCARDIOGRAM (TEE) (N/A) as a surgical intervention.  The patient's history has been reviewed, patient examined, no change in status, stable for surgery.  I have reviewed the patient's chart and labs.  Questions were answered to the patient's satisfaction.     [employer]

## 2018-10-24 NOTE — TOC Progression Note (Signed)
Transition of Care [facility]) - Progression Note    Patient Details  Name: Cynthia Hunt MRN: [id number] Date of Birth: [birth year]  Transition of Care [facility] [place] CM/SW [place], [place] Phone Number: 10/24/2018, 12:30 PM  Clinical Narrative:  CSW met with patient to discuss discharge plans. CSW discussed with patient if insurance was too expensive for CIR, that SNF was an option. Patient hopeful that she wouldn't need it for very long, thinks she might be well enough to try going home, but is worried about not having support. Patient agreeable to looking into SNF at this time for a short period of rehab if she can afford it. CSW also received call from [facility], asking if they are able to come up to the hospital to further their investigation of the report made. CSW discussed with CSW AD and they are able to call into patient's room. CSW discussed with patient, she is agreeable to talking to [place] investigator. CSW called [facility]PS investigator back to provide room number for her to call patient. CSW to fax out SNF referral and follow.    Expected Discharge Plan: Skilled Nursing Facility Barriers to Discharge: Continued Medical Work up, Ship broker, Family Issues  Expected Discharge Plan and Services Expected Discharge Plan: [place] arrangements for the past 2 months: Single Family Home                                       Social Determinants of Health (SDOH) Interventions    Readmission Risk Interventions No flowsheet data found.

## 2018-10-24 NOTE — [place] FL2 (Signed)
[place] MEDICAID FL2 LEVEL OF CARE SCREENING TOOL     IDENTIFICATION  Patient Name: Cynthia Hunt Birthdate: 1946-04-03 Sex: female Admission Date (Current Location): 10/22/2018  [facility] [place]   Facility and Address:  The . [facility], [address], [place], [place] [address]      Provider Number: [id number]  Attending Physician Name and Address:  Joseph ArtVann, Jessica U, DO  Relative Name and Phone Number:       Current Level of Care: Hospital Recommended Level of Care: Skilled Nursing Facility Prior Approval Number:    Date Approved/Denied:   PASRR Number: [id number]  Discharge Plan: SNF    Current Diagnoses: Patient Active Problem List   Diagnosis Date Noted  . Acute ischemic stroke (HCC) 10/22/2018  . Essential hypertension 10/22/2018  . Dyslipidemia 10/22/2018  . Uncontrolled type 2 diabetes mellitus without complication, with long-term current use of insulin (HCC) 02/13/2016    Orientation RESPIRATION BLADDER Height & Weight     Self, Time, Situation, Place  Normal Continent Weight: 205 lb (93 kg) Height:  5\' 7"  (170.2 cm)  BEHAVIORAL SYMPTOMS/MOOD NEUROLOGICAL BOWEL NUTRITION STATUS      Continent Diet(see DC summary)  AMBULATORY STATUS COMMUNICATION OF NEEDS Skin   Limited Assist Verbally Normal                       Personal Care Assistance Level of Assistance  Bathing, Feeding, Dressing Bathing Assistance: Limited assistance Feeding assistance: Independent Dressing Assistance: Limited assistance     Functional Limitations Info  Sight, Hearing, Speech Sight Info: Adequate Hearing Info: Adequate Speech Info: Adequate    SPECIAL CARE FACTORS FREQUENCY  PT (By licensed PT), OT (By licensed OT)     PT Frequency: 5x/wk OT Frequency: 5x/wk            Contractures Contractures Info: Not present    Additional Factors Info  Code Status, Allergies, Insulin Sliding Scale Code Status Info:  Full Allergies Info: Penicillins   Insulin Sliding Scale Info: 0-15 units 3x/day with meals; 0-5 units daily at bed; Lantus 30 units daily at bed       Current Medications (10/24/2018):  This is the current hospital active medication list Current Facility-Administered Medications  Medication Dose Route Frequency Provider Last Rate Last Dose  .  stroke: mapping our early stages of recovery book   Does not apply Once Jonah BlueYates, Jennifer, MD      . 0.9 %  sodium chloride infusion   Intravenous Continuous Jonah BlueYates, Jennifer, MD 50 mL/hr at 10/22/18 1115    . acetaminophen (TYLENOL) tablet 650 mg  650 mg Oral Q4H PRN Jonah BlueYates, Jennifer, MD       Or  . acetaminophen (TYLENOL) solution 650 mg  650 mg Per Tube Q4H PRN Jonah BlueYates, Jennifer, MD       Or  . acetaminophen (TYLENOL) suppository 650 mg  650 mg Rectal Q4H PRN Jonah BlueYates, Jennifer, MD      . ALPRAZolam Prudy Feeler(XANAX) tablet 0.5 mg  0.5 mg Oral Daily PRN Jonah BlueYates, Jennifer, MD      . aspirin EC tablet 81 mg  81 mg Oral Daily Annie MainBiby, Sharon L, NP   81 mg at 10/24/18 1049  . atorvastatin (LIPITOR) tablet 40 mg  40 mg Oral q1800 Jonah BlueYates, Jennifer, MD   40 mg at 10/23/18 1749  . clopidogrel (PLAVIX) tablet 75 mg  75 mg Oral Daily Annie MainBiby, Sharon L, NP   75 mg at  10/24/18 1049  . enoxaparin (LOVENOX) injection 40 mg  40 mg Subcutaneous Q24H Jonah Blue, MD   40 mg at 10/23/18 1218  . insulin aspart (novoLOG) injection 0-15 Units  0-15 Units Subcutaneous TID WC Jonah Blue, MD   8 Units at 10/23/18 1749  . insulin aspart (novoLOG) injection 0-5 Units  0-5 Units Subcutaneous QHS Jonah Blue, MD   2 Units at 10/22/18 2308  . insulin glargine (LANTUS) injection 30 Units  30 Units Subcutaneous QHS Jonah Blue, MD   30 Units at 10/23/18 2124  . methocarbamol (ROBAXIN) tablet 500 mg  500 mg Oral Daily Jonah Blue, MD   500 mg at 10/24/18 1049  . senna-docusate (Senokot-S) tablet 1 tablet  1 tablet Oral QHS PRN Jonah Blue, MD      . traZODone (DESYREL) tablet 100  mg  100 mg Oral Noemi Chapel, MD   100 mg at 10/23/18 2122     Discharge Medications: Please see discharge summary for a list of discharge medications.  Relevant Imaging Results:  Relevant Lab Results:   Additional Information SS#: [id number]  Baldemar Lenis, LCSW

## 2018-10-24 NOTE — Progress Notes (Signed)
  Echocardiogram 2D Echocardiogram has been performed.  Cynthia Hunt 10/24/2018, 2:50 PM

## 2018-10-24 NOTE — H&P (View-Only) (Signed)
° ° ° °Progress Note ° °Patient Name: Tabatha M Jankovich °Date of Encounter: 10/24/2018 ° °Primary Cardiologist: New-Dr. Gentle Hoge, MD  ° °Subjective  ° °Going for TEE today.  Discussed.  Ate breakfast.  Plan at 2:00. ° °Inpatient Medications  °  °Scheduled Meds: °•  stroke: mapping our early stages of recovery book   Does not apply Once  °• aspirin EC  81 mg Oral Daily  °• atorvastatin  40 mg Oral q1800  °• clopidogrel  75 mg Oral Daily  °• enoxaparin (LOVENOX) injection  40 mg Subcutaneous Q24H  °• insulin aspart  0-15 Units Subcutaneous TID WC  °• insulin aspart  0-5 Units Subcutaneous QHS  °• insulin glargine  30 Units Subcutaneous QHS  °• methocarbamol  500 mg Oral Daily  °• traZODone  100 mg Oral QHS  ° °Continuous Infusions: °• sodium chloride 50 mL/hr at 10/22/18 1115  ° °PRN Meds: °acetaminophen **OR** acetaminophen (TYLENOL) oral liquid 160 mg/5 mL **OR** acetaminophen, ALPRAZolam, senna-docusate  ° °Vital Signs  °  °Vitals:  ° 10/23/18 1535 10/23/18 1928 10/23/18 2316 10/24/18 0334  °BP: (!) 148/77 (!) 154/78 (!) 190/83 135/84  °Pulse: 91 80 82 76  °Resp: 15 15 15 15  °Temp: 98.3 °F (36.8 °C) 98.4 °F (36.9 °C) 98.5 °F (36.9 °C) 98.3 °F (36.8 °C)  °TempSrc: Oral Oral Oral Oral  °SpO2: 94% 100% 96% 100%  °Weight:      °Height:      ° ° °Intake/Output Summary (Last 24 hours) at 10/24/2018 0814 °Last data filed at 10/23/2018 2100 °Gross per 24 hour  °Intake 240 ml  °Output --  °Net 240 ml  ° °Filed Weights  ° 10/22/18 0752  °Weight: 93 kg  ° ° °Physical Exam  ° °GEN: Well nourished, well developed, in no acute distress.  °HEENT: Grossly normal.  °Neck: Supple, no JVD, carotid bruits, or masses. °Cardiac: RRR, no murmurs, rubs, or gallops. No clubbing, cyanosis, edema.  Radials/DP/PT 2+ and equal bilaterally.  °Respiratory:  Respirations regular and unlabored, clear to auscultation bilaterally. °GI: Soft, nontender, nondistended, BS + x 4. °MS: no deformity or atrophy. °Skin: warm and dry, no rash. °Neuro:   Strength and sensation are intact. °Psych: AAOx3.  Normal affect. ° °Labs  °  °Chemistry °Recent Labs  °Lab 10/22/18 °0755 10/22/18 °0759  °NA 140  --   °K 3.4*  --   °CL 100  --   °CO2 22  --   °GLUCOSE 175*  --   °BUN 18  --   °CREATININE 1.34* 1.10*  °CALCIUM 8.2*  --   °PROT 7.7  --   °ALBUMIN 3.5  --   °AST 22  --   °ALT 20  --   °ALKPHOS 49  --   °BILITOT 0.8  --   °GFRNONAA 39*  --   °GFRAA 46*  --   °ANIONGAP 18*  --   °  ° °Hematology °Recent Labs  °Lab 10/22/18 °0755  °WBC 7.4  °RBC 3.71*  °HGB 10.5*  °HCT 35.0*  °MCV 94.3  °MCH 28.3  °MCHC 30.0  °RDW 13.6  °PLT 238  ° ° °Cardiac EnzymesNo results for input(s): TROPONINI in the last 168 hours. No results for input(s): TROPIPOC in the last 168 hours.  ° °BNPNo results for input(s): BNP, PROBNP in the last 168 hours.  ° °DDimer No results for input(s): DDIMER in the last 168 hours.  ° °Radiology  °  °Ct Head Wo Contrast ° °Result   Date: 10/22/2018 °CLINICAL DATA:  Increasing LEFT-sided weakness. Symptoms began 2 days ago. EXAM: CT HEAD WITHOUT CONTRAST TECHNIQUE: Contiguous axial images were obtained from the base of the skull through the vertex without intravenous contrast. COMPARISON:  No priors for comparison. FINDINGS: Brain: Age-indeterminate hypodensity of RIGHT basal ganglia, between the caudate and lentiform nucleus, could represent developing acute infarction. Similarly, asymmetric hypoattenuation of the RIGHT centrum semiovale, could be acute or chronic. No definite acute cortical hypodensities. No hemorrhage, mass lesion, hydrocephalus, or extra-axial fluid. Mild cerebral and cerebellar atrophy. Generalized white matter hypoattenuation could represent small vessel disease. Vascular: Calcification of the cavernous internal carotid arteries consistent with cerebrovascular atherosclerotic disease. No signs of intracranial large vessel occlusion. Skull: Intact. Sinuses/Orbits: No acute sinus findings. Conjugate gaze to the RIGHT. Other: None.  IMPRESSION: 1. Age-indeterminate RIGHT basal ganglia and RIGHT centrum semiovale hypodensities could represent acute or chronic infarction. 2. If no contraindications, MRI of the brain without contrast recommended for further evaluation. Electronically Signed   By: John T Curnes M.D.   On: 10/22/2018 08:28  ° °Mr Mra Head Wo Contrast ° °Result Date: 10/22/2018 °CLINICAL DATA:  Stroke-like symptoms. Slipped off bed, and could not get off floor. Reportedly, LEFT hemiparesis for 2 days, but patient did not want to come to hospital. Stroke risk factors include uncontrolled type 2 diabetes mellitus, essential hypertension and dyslipidemia. EXAM: MR HEAD WITHOUT CONTRAST MR CIRCLE OF WILLIS WITHOUT CONTRAST MRA OF THE NECK WITHOUT AND WITH CONTRAST TECHNIQUE: Multiplanar, multiecho pulse sequences of the brain, circle of Willis and surrounding structures were obtained without intravenous contrast. Angiographic images of the neck were obtained using MRA technique without and with intravenous contrast. CONTRAST:  Gadavist 10 mL. COMPARISON:  Noncontrast CT head earlier today. FINDINGS: MR HEAD FINDINGS Brain: Multifocal areas of restricted diffusion, corresponding low ADC, affect the RIGHT frontal cortex, posterior frontal cortex, and subcortical white matter consistent with acute nonhemorrhagic infarcts. One area of acute infarction, roughly 8 mm in diameter, corresponds with the abnormality noted on CT earlier today. The second area questioned on earlier CT, in the RIGHT basal ganglia, represents an area of chronic infarction. No mass lesion, hydrocephalus, or extra-axial fluid. Generalized atrophy. Moderately advanced T2 and FLAIR hyperintensities throughout the white matter, likely small vessel disease. Vascular: Reported separately. Skull and upper cervical spine: No acute findings. Partial empty sella. Sinuses/Orbits: Mucosal thickening.  Negative orbits. Other: None. MR CIRCLE OF WILLIS FINDINGS RIGHT ICA: Nonstenotic  irregularity in its cavernous segment. Otherwise widely patent. M1 MCA widely patent. A1 ACA widely patent. High-grade 75% or greater stenosis of the superior division M2 MCA on the RIGHT. See series 5 image 122. This is likely contributory to the observed pattern of infarction. LEFT ICA: 50% cavernous stenosis. 50-75% stenosis at the junction of the cavernous and supraclinoid ICA. LEFT M1 MCA widely patent. 50% stenosis origin LEFT A1 ACA. Nonstenotic irregularity LEFT M2 vessels. Distal anterior cerebral arteries are mildly irregular but patent. Basilar artery mildly irregular but patent. RIGHT vertebral dominant contributor. LEFT vertebral diminutive but patent. Fetal origin LEFT PCA. RIGHT PCA widely patent. No cerebellar branch occlusion. No saccular aneurysm. MRA NECK FINDINGS Dolichoectatic great vessels, but no proximal stenosis. Nonstenotic irregularity RIGHT carotid bifurcation. Nonstenotic irregularity LEFT carotid bifurcation. Both vertebral arteries are patent without significant ostial stenosis, RIGHT dominant. IMPRESSION: Multifocal areas of acute infarction, RIGHT MCA territory, affect the RIGHT frontal and posterior frontal cortex and subcortical white matter. High-grade stenosis of the RIGHT M2 MCA superior division is likely contributory   to the observed pattern of ischemia. 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 No intracranial hemorrhage or mass. 50-75% LEFT ICA cavernous/supraclinoid stenosis; otherwise, no proximal flow-limiting stenosis of the carotid or basilar arteries. Extracranial vasculature is dolichoectatic without flow-limiting stenosis, dissection, or occlusion. Minor atheromatous change both carotid bifurcations. Electronically Signed   By: John T Curnes M.D.   On: 10/22/2018 14:22  ° °Mr Angiogram Neck W Or Wo Contrast ° °Result Date: 10/22/2018 °CLINICAL DATA:  Stroke-like symptoms. Slipped off bed, and could not get off floor.  Reportedly, LEFT hemiparesis for 2 days, but patient did not want to come to hospital. Stroke risk factors include uncontrolled type 2 diabetes mellitus, essential hypertension and dyslipidemia. EXAM: MR HEAD WITHOUT CONTRAST MR CIRCLE OF WILLIS WITHOUT CONTRAST MRA OF THE NECK WITHOUT AND WITH CONTRAST TECHNIQUE: Multiplanar, multiecho pulse sequences of the brain, circle of Willis and surrounding structures were obtained without intravenous contrast. Angiographic images of the neck were obtained using MRA technique without and with intravenous contrast. CONTRAST:  Gadavist 10 mL. COMPARISON:  Noncontrast CT head earlier today. FINDINGS: MR HEAD FINDINGS Brain: Multifocal areas of restricted diffusion, corresponding low ADC, affect the RIGHT frontal cortex, posterior frontal cortex, and subcortical white matter consistent with acute nonhemorrhagic infarcts. One area of acute infarction, roughly 8 mm in diameter, corresponds with the abnormality noted on CT earlier today. The second area questioned on earlier CT, in the RIGHT basal ganglia, represents an area of chronic infarction. No mass lesion, hydrocephalus, or extra-axial fluid. Generalized atrophy. Moderately advanced T2 and FLAIR hyperintensities throughout the white matter, likely small vessel disease. Vascular: Reported separately. Skull and upper cervical spine: No acute findings. Partial empty sella. Sinuses/Orbits: Mucosal thickening.  Negative orbits. Other: None. MR CIRCLE OF WILLIS FINDINGS RIGHT ICA: Nonstenotic irregularity in its cavernous segment. Otherwise widely patent. M1 MCA widely patent. A1 ACA widely patent. High-grade 75% or greater stenosis of the superior division M2 MCA on the RIGHT. See series 5 image 122. This is likely contributory to the observed pattern of infarction. LEFT ICA: 50% cavernous stenosis. 50-75% stenosis at the junction of the cavernous and supraclinoid ICA. LEFT M1 MCA widely patent. 50% stenosis origin LEFT A1 ACA.  Nonstenotic irregularity LEFT M2 vessels. Distal anterior cerebral arteries are mildly irregular but patent. Basilar artery mildly irregular but patent. RIGHT vertebral dominant contributor. LEFT vertebral diminutive but patent. Fetal origin LEFT PCA. RIGHT PCA widely patent. No cerebellar branch occlusion. No saccular aneurysm. MRA NECK FINDINGS Dolichoectatic great vessels, but no proximal stenosis. Nonstenotic irregularity RIGHT carotid bifurcation. Nonstenotic irregularity LEFT carotid bifurcation. Both vertebral arteries are patent without significant ostial stenosis, RIGHT dominant. IMPRESSION: Multifocal areas of acute infarction, RIGHT MCA territory, affect the RIGHT frontal and posterior frontal cortex and subcortical white matter. High-grade stenosis of the RIGHT M2 MCA superior division is likely contributory to the observed pattern of ischemia. 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 No intracranial hemorrhage or mass. 50-75% LEFT ICA cavernous/supraclinoid stenosis; otherwise, no proximal flow-limiting stenosis of the carotid or basilar arteries. Extracranial vasculature is dolichoectatic without flow-limiting stenosis, dissection, or occlusion. Minor atheromatous change both carotid bifurcations. Electronically Signed   By: John T Curnes M.D.   On: 10/22/2018 14:22  ° °Mr Brain Wo Contrast ° °Result Date: 10/22/2018 °CLINICAL DATA:  Stroke-like symptoms. Slipped off bed, and could not get off floor. Reportedly, LEFT hemiparesis for 2 days, but patient did not want to come to   hospital. Stroke risk factors include uncontrolled type 2 diabetes mellitus, essential hypertension and dyslipidemia. EXAM: MR HEAD WITHOUT CONTRAST MR CIRCLE OF WILLIS WITHOUT CONTRAST MRA OF THE NECK WITHOUT AND WITH CONTRAST TECHNIQUE: Multiplanar, multiecho pulse sequences of the brain, circle of Willis and surrounding structures were obtained without intravenous contrast.  Angiographic images of the neck were obtained using MRA technique without and with intravenous contrast. CONTRAST:  Gadavist 10 mL. COMPARISON:  Noncontrast CT head earlier today. FINDINGS: MR HEAD FINDINGS Brain: Multifocal areas of restricted diffusion, corresponding low ADC, affect the RIGHT frontal cortex, posterior frontal cortex, and subcortical white matter consistent with acute nonhemorrhagic infarcts. One area of acute infarction, roughly 8 mm in diameter, corresponds with the abnormality noted on CT earlier today. The second area questioned on earlier CT, in the RIGHT basal ganglia, represents an area of chronic infarction. No mass lesion, hydrocephalus, or extra-axial fluid. Generalized atrophy. Moderately advanced T2 and FLAIR hyperintensities throughout the white matter, likely small vessel disease. Vascular: Reported separately. Skull and upper cervical spine: No acute findings. Partial empty sella. Sinuses/Orbits: Mucosal thickening.  Negative orbits. Other: None. MR CIRCLE OF WILLIS FINDINGS RIGHT ICA: Nonstenotic irregularity in its cavernous segment. Otherwise widely patent. M1 MCA widely patent. A1 ACA widely patent. High-grade 75% or greater stenosis of the superior division M2 MCA on the RIGHT. See series 5 image 122. This is likely contributory to the observed pattern of infarction. LEFT ICA: 50% cavernous stenosis. 50-75% stenosis at the junction of the cavernous and supraclinoid ICA. LEFT M1 MCA widely patent. 50% stenosis origin LEFT A1 ACA. Nonstenotic irregularity LEFT M2 vessels. Distal anterior cerebral arteries are mildly irregular but patent. Basilar artery mildly irregular but patent. RIGHT vertebral dominant contributor. LEFT vertebral diminutive but patent. Fetal origin LEFT PCA. RIGHT PCA widely patent. No cerebellar branch occlusion. No saccular aneurysm. MRA NECK FINDINGS Dolichoectatic great vessels, but no proximal stenosis. Nonstenotic irregularity RIGHT carotid bifurcation.  Nonstenotic irregularity LEFT carotid bifurcation. Both vertebral arteries are patent without significant ostial stenosis, RIGHT dominant. IMPRESSION: Multifocal areas of acute infarction, RIGHT MCA territory, affect the RIGHT frontal and posterior frontal cortex and subcortical white matter. High-grade stenosis of the RIGHT M2 MCA superior division is likely contributory to the observed pattern of ischemia. 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 No intracranial hemorrhage or mass. 50-75% LEFT ICA cavernous/supraclinoid stenosis; otherwise, no proximal flow-limiting stenosis of the carotid or basilar arteries. Extracranial vasculature is dolichoectatic without flow-limiting stenosis, dissection, or occlusion. Minor atheromatous change both carotid bifurcations. Electronically Signed   By: John T Curnes M.D.   On: 10/22/2018 14:22  ° °Vas Us Carotid (at [facility] Only) ° °Result Date: 10/23/2018 °Carotid Arterial Duplex Study Indications: CVA. Performing Technologist: Megan Riddle RVS  Examination Guidelines: A complete evaluation includes B-mode imaging, spectral Doppler, color Doppler, and power Doppler as needed of all accessible portions of each vessel. Bilateral testing is considered an integral part of a complete examination. Limited examinations for reoccurring indications may be performed as noted.  Right Carotid Findings: +----------+--------+--------+--------+------------+--------+             PSV cm/s EDV cm/s Stenosis Describe     Comments  +----------+--------+--------+--------+------------+--------+  CCA Prox   45                         heterogenous           +----------+--------+--------+--------+------------+--------+  CCA Distal 61         14                heterogenous           +----------+--------+--------+--------+------------+--------+  ICA Prox   75       25       1-39%    heterogenous            +----------+--------+--------+--------+------------+--------+  ICA Distal 59       19                                       +----------+--------+--------+--------+------------+--------+  ECA        113      6                                        +----------+--------+--------+--------+------------+--------+ +----------+--------+-------+--------+-------------------+             PSV cm/s EDV cms Describe Arm Pressure (mmHG)  +----------+--------+-------+--------+-------------------+  Subclavian 86                                             +----------+--------+-------+--------+-------------------+ +---------+--------+--+--------+--+---------+  Vertebral PSV cm/s 53 EDV cm/s 16 Antegrade  +---------+--------+--+--------+--+---------+  Left Carotid Findings: +----------+--------+--------+--------+------------+--------+             PSV cm/s EDV cm/s Stenosis Describe     Comments  +----------+--------+--------+--------+------------+--------+  CCA Prox   111      17                heterogenous           +----------+--------+--------+--------+------------+--------+  CCA Distal 66       14                heterogenous           +----------+--------+--------+--------+------------+--------+  ICA Prox   86       27       1-39%    heterogenous           +----------+--------+--------+--------+------------+--------+  ICA Distal 56       18                                       +----------+--------+--------+--------+------------+--------+  ECA        85       7                                        +----------+--------+--------+--------+------------+--------+ +----------+--------+--------+--------+-------------------+  Subclavian PSV cm/s EDV cm/s Describe Arm Pressure (mmHG)  +----------+--------+--------+--------+-------------------+             93                                              +----------+--------+--------+--------+-------------------+ +---------+--------+--+--------+--+---------+  Vertebral PSV cm/s 32 EDV  cm/s 10 Antegrade  +---------+--------+--+--------+--+---------+  Summary:   *See table(s) above for measurements and observations.  Electronically   signed by Pramod Sethi MD on 10/23/2018 at 1:47:46 PM.    Final   ° °Telemetry  °  °Sinus rhythm- Personally Reviewed ° °ECG  °  °No new tracing as of 10/24/2018 - Personally Reviewed ° °Cardiac Studies  ° °Echocardiogram 10/22/2018: °1. The aortic valve is abnormal. Aortic valve regurgitation is trivial by color flow Doppler. There is leaflet thickening along the coaptation points centrally. Cannot exclude aortic valve vegetations. Differential diagnosis includes nonbacterial  °thrombotic endocarditis, infective endocarditis, papillary fibroelastoma, or valve degeneration. Consider cardiology consultation with consideration for further imaging if clinically indicated in the setting of stroke. ° 2. The left ventricle has normal systolic function, with an ejection fraction of 55-60%. The cavity size was normal. There is mildly increased left ventricular wall thickness. ° 3. The right ventricle has normal systolc function. The cavity was normal. There is no increase in right ventricular wall thickness. ° 4. Mild thickening of the mitral valve leaflet. ° 5. The inferior vena cava was normal in size with <50% respiratory variability. °  °Patient Profile  °   °72 y.o. female with a hx of stroke who is being seen today for the evaluation of abnormal ECHO, aortic valve at the request of Dr. Sethi. ° °Assessment & Plan  °  °1.  Acute stroke with abnormal echocardiogram: °-Plans today for TEE today with Dr. Starlena Beil to further evaluate abnormal echocardiogram which was performed 10/22/2018 with aortic valve leaflet thickening with questionable aortic valve vegetations °-Risk and benefit of TEE as well as procedure itself were re-reviewed including esophageal abrasion, perforation, hypotension and risk of aspiration.  She is agreeable to proceed with the procedure today °-Further  plans will be determined by TEE results °-Continue ASA 81, Plavix 75 per neurology ° ° °Signed, °Jill McDaniel NP-C °[facility] °Pager: [phone] °10/24/2018, 8:14 AM   ° ° °For questions or updates, please contact   °Please consult [email or web address] for contact info under Cardiology/STEMI. ° °Personally seen and examined. Agree with above. ° °Acute stroke with abnormal contours on aortic valve °TEE.  Aspirin Plavix. °Samarah Hogle, MD ° ° °

## 2018-10-24 NOTE — Progress Notes (Signed)
Inpatient Rehabilitation Admissions Coordinator  Inpatient Rehab Consult received. I met with patient at the bedside for rehabilitation assessment. We discussed goals and expectations of an inpatient rehab admission. She has a 73 year old god daughter that lives with her. She can provide 24/7 supervision. Pt was completely Independent, driving pta. She would like me to verify [employer] coverage before she would consider a possible inpt rehab admission. Pt is an appropriate candidate pending insurance approval once medical work up is complete. Noted pt for TEE this afternoon. I will follow up in the morning.  Danne Baxter, RN, MSN Rehab Admissions Coordinator [phone] 10/24/2018 9:56 AM '

## 2018-10-24 NOTE — CV Procedure (Signed)
   Transesophageal Echocardiogram  Indications: CVA  Time out performed  During this procedure the patient is administered a total of Versed 5 mg and Fentanyl 100 mcg to achieve and maintain moderate conscious sedation.  The patient's heart rate, blood pressure, and oxygen saturation are monitored continuously during the procedure. The period of conscious sedation is 25 minutes, of which I was present face-to-face 100% of this time.  Findings:  Left Ventricle: EF 55% normal  Mitral Valve: Normal, trace MR  Aortic Valve: Mildly thickened, no vegetation  Tricuspid Valve: Normal, trace TR  Left Atrium: Normal appendage, no thrombus  Bubble Contrast Study: negative, no shunt  Reassuring.  Donato Schultz, MD

## 2018-10-24 NOTE — Progress Notes (Signed)
Progress Note    Cynthia Hunt  ZOX:[id number] DOB: 1945-08-09  DOA: 10/22/2018 PCP: Darrow BussingKoirala, Dibas, MD    Brief Narrative:     Medical records reviewed and are as summarized below:  Stark BraySheila M Hunt is an 73 y.o. female  with medical history significant of HTN; HLD: DM; and depression presenting with a CVA.  TEE for possible valve issue pending as well as possible placement in CIR  Assessment/Plan:   Principal Problem:   Acute ischemic stroke [facility](HCC) Active Problems:   Uncontrolled type 2 diabetes mellitus without complication, with long-term current use of insulin (HCC)   Essential hypertension   Dyslipidemia  CVA: right MCA cortical and subcortical infarct embolic in setting of abnormal aortic valve, stroke secondary to unknown source -Patient developed L hemiparesis on Friday concerning for CVA, but refused evaluation until today -CT with R basal ganglia and centrum semiovale hypodensities c/w acute/subacute CVA -MRI/MRA: Multifocal areas of acute infarction -Echo: The aortic valve is abnormal. Aortic valve regurgitation is trivial by color flow Doppler. There is leaflet thickening along the coaptation points centrally. Cannot exclude aortic valve vegetations. Differential diagnosis includes nonbacterial  thrombotic endocarditis, infective endocarditis, papillary fibroelastoma, or valve degeneration. Consider cardiology consultation with consideration for further imaging if clinically indicated in the setting of stroke.  -TEE planned for 4/28 -LDL 81 HgbA1c: 9 -Neurology consult appreciated: recommend aspirin 81 mg and plavix 75 mg daily x 3 weeks, then aspirin alone -PT/OT- CIR -blood cultures NGTD -sed rate mildly elevated at 55  HTN -Allow permissive HTNfor now -Treat BP only if >220/120, and then with goal of 15% reduction -Hold ARB and HCTZand plan to restart in am  HLD -LDL 81-- goal of <70 -Resume statin but will change Zocor to Lipitor 40 mg  daily  DM -A1c : 9 -Hold home PO metfomin -Continue Lantus -SSI  obesity Body mass index is 32.11 kg/m.   Family Communication/Anticipated D/C date and plan/Code Status   DVT prophylaxis: Lovenox ordered. Code Status: Full Code.  Family Communication:  Disposition Plan: CIR?  Needs TEE prior   Medical Consultants:    Neuro  cardiology  Subjective:   Just found out a family member died  Objective:    Vitals:   10/23/18 2316 10/24/18 0334 10/24/18 0825 10/24/18 1106  BP: (!) 190/83 135/84 (!) 198/98 (!) 189/85  Pulse: 82 76 82 84  Resp: 15 15 18 18   Temp: 98.5 F (36.9 C) 98.3 F (36.8 C) (!) 97.5 F (36.4 C) 98.1 F (36.7 C)  TempSrc: Oral Oral Oral Oral  SpO2: 96% 100% 100% 100%  Weight:      Height:        Intake/Output Summary (Last 24 hours) at 10/24/2018 1216 Last data filed at 10/23/2018 2100 Gross per 24 hour  Intake 240 ml  Output --  Net 240 ml   Filed Weights   10/22/18 0752  Weight: 93 kg    Exam: Awake, on the phone rrr Clear, no increased work of breathing A+Ox3  Data Reviewed:   I have personally reviewed following labs and imaging studies:  Labs: Labs show the following:   Basic Metabolic Panel: Recent Labs  Lab 10/22/18 0755 10/22/18 0759  NA 140  --   K 3.4*  --   CL 100  --   CO2 22  --   GLUCOSE 175*  --   BUN 18  --   CREATININE 1.34* 1.10*  CALCIUM 8.2*  --  GFR Estimated Creatinine Clearance: 54.2 mL/min (A) (by C-G formula based on SCr of 1.1 mg/dL (H)). Liver Function Tests: Recent Labs  Lab 10/22/18 0755  AST 22  ALT 20  ALKPHOS 49  BILITOT 0.8  PROT 7.7  ALBUMIN 3.5   No results for input(s): LIPASE, AMYLASE in the last 168 hours. No results for input(s): AMMONIA in the last 168 hours. Coagulation profile Recent Labs  Lab 10/22/18 0755  INR 1.1    CBC: Recent Labs  Lab 10/22/18 0755  WBC 7.4  NEUTROABS 6.1  HGB 10.5*  HCT 35.0*  MCV 94.3  PLT 238   Cardiac  Enzymes: Recent Labs  Lab 10/22/18 0755  CKTOTAL 69   BNP (last 3 results) No results for input(s): PROBNP in the last 8760 hours. CBG: Recent Labs  Lab 10/23/18 2108 10/24/18 0658  GLUCAP 192* 97   D-Dimer: No results for input(s): DDIMER in the last 72 hours. Hgb A1c: Recent Labs    10/22/18 0755 10/23/18 0604  HGBA1C 9.2* 9.0*   Lipid Profile: Recent Labs    10/23/18 0604  CHOL 136  HDL 41  LDLCALC 81  TRIG 71  CHOLHDL 3.3   Thyroid function studies: Recent Labs    10/22/18 1107  TSH 1.061   Anemia work up: No results for input(s): VITAMINB12, FOLATE, FERRITIN, TIBC, IRON, RETICCTPCT in the last 72 hours. Sepsis Labs: Recent Labs  Lab 10/22/18 0755  WBC 7.4    Microbiology Recent Results (from the past 240 hour(s))  Culture, blood (Routine X 2) w Reflex to ID Panel     Status: None (Preliminary result)   Collection Time: 10/23/18  3:15 PM  Result Value Ref Range Status   Specimen Description BLOOD LEFT ANTECUBITAL  Final   Special Requests   Final    BOTTLES DRAWN AEROBIC ONLY Blood Culture adequate volume   Culture   Final    NO GROWTH < 24 HOURS Performed at [facility], [address] [place], [place] [address]    Report Status PENDING  Incomplete  Culture, blood (Routine X 2) w Reflex to ID Panel     Status: None (Preliminary result)   Collection Time: 10/23/18  3:35 PM  Result Value Ref Range Status   Specimen Description BLOOD LEFT ARM  Final   Special Requests   Final    BOTTLES DRAWN AEROBIC ONLY Blood Culture adequate volume   Culture   Final    NO GROWTH < 24 HOURS Performed at [facility], [address] [place], [place] [address]    Report Status PENDING  Incomplete    Procedures and diagnostic studies:  Mr Shirlee Latch RV Contrast  Result Date: 10/22/2018 CLINICAL DATA:  Stroke-like symptoms. Slipped off bed, and could not get off floor. Reportedly, LEFT hemiparesis for 2 days, but patient did not want to come  to hospital. Stroke risk factors include uncontrolled type 2 diabetes mellitus, essential hypertension and dyslipidemia. EXAM: MR HEAD WITHOUT CONTRAST MR CIRCLE OF WILLIS WITHOUT CONTRAST MRA OF THE NECK WITHOUT AND WITH CONTRAST TECHNIQUE: Multiplanar, multiecho pulse sequences of the brain, circle of Willis and surrounding structures were obtained without intravenous contrast. Angiographic images of the neck were obtained using MRA technique without and with intravenous contrast. CONTRAST:  Gadavist 10 mL. COMPARISON:  Noncontrast CT head earlier today. FINDINGS: MR HEAD FINDINGS Brain: Multifocal areas of restricted diffusion, corresponding low ADC, affect the RIGHT frontal cortex, posterior frontal cortex, and subcortical white matter  consistent with acute nonhemorrhagic infarcts. One area of acute infarction, roughly 8 mm in diameter, corresponds with the abnormality noted on CT earlier today. The second area questioned on earlier CT, in the RIGHT basal ganglia, represents an area of chronic infarction. No mass lesion, hydrocephalus, or extra-axial fluid. Generalized atrophy. Moderately advanced T2 and FLAIR hyperintensities throughout the white matter, likely small vessel disease. Vascular: Reported separately. Skull and upper cervical spine: No acute findings. Partial empty sella. Sinuses/Orbits: Mucosal thickening.  Negative orbits. Other: None. MR CIRCLE OF WILLIS FINDINGS RIGHT ICA: Nonstenotic irregularity in its cavernous segment. Otherwise widely patent. M1 MCA widely patent. A1 ACA widely patent. High-grade 75% or greater stenosis of the superior division M2 MCA on the RIGHT. See series 5 image 122. This is likely contributory to the observed pattern of infarction. LEFT ICA: 50% cavernous stenosis. 50-75% stenosis at the junction of the cavernous and supraclinoid ICA. LEFT M1 MCA widely patent. 50% stenosis origin LEFT A1 ACA. Nonstenotic irregularity LEFT M2 vessels. Distal anterior cerebral arteries  are mildly irregular but patent. Basilar artery mildly irregular but patent. RIGHT vertebral dominant contributor. LEFT vertebral diminutive but patent. Fetal origin LEFT PCA. RIGHT PCA widely patent. No cerebellar branch occlusion. No saccular aneurysm. MRA NECK FINDINGS Dolichoectatic great vessels, but no proximal stenosis. Nonstenotic irregularity RIGHT carotid bifurcation. Nonstenotic irregularity LEFT carotid bifurcation. Both vertebral arteries are patent without significant ostial stenosis, RIGHT dominant. IMPRESSION: Multifocal areas of acute infarction, RIGHT MCA territory, affect the RIGHT frontal and posterior frontal cortex and subcortical white matter. High-grade stenosis of the RIGHT M2 MCA superior division is likely contributory to the observed pattern of ischemia. 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 No intracranial hemorrhage or mass. 50-75% LEFT ICA cavernous/supraclinoid stenosis; otherwise, no proximal flow-limiting stenosis of the carotid or basilar arteries. Extracranial vasculature is dolichoectatic without flow-limiting stenosis, dissection, or occlusion. Minor atheromatous change both carotid bifurcations. Electronically Signed   By: Elsie Stain M.D.   On: 10/22/2018 14:22   Mr Angiogram Neck W Or Wo Contrast  Result Date: 10/22/2018 CLINICAL DATA:  Stroke-like symptoms. Slipped off bed, and could not get off floor. Reportedly, LEFT hemiparesis for 2 days, but patient did not want to come to hospital. Stroke risk factors include uncontrolled type 2 diabetes mellitus, essential hypertension and dyslipidemia. EXAM: MR HEAD WITHOUT CONTRAST MR CIRCLE OF WILLIS WITHOUT CONTRAST MRA OF THE NECK WITHOUT AND WITH CONTRAST TECHNIQUE: Multiplanar, multiecho pulse sequences of the brain, circle of Willis and surrounding structures were obtained without intravenous contrast. Angiographic images of the neck were obtained using MRA technique  without and with intravenous contrast. CONTRAST:  Gadavist 10 mL. COMPARISON:  Noncontrast CT head earlier today. FINDINGS: MR HEAD FINDINGS Brain: Multifocal areas of restricted diffusion, corresponding low ADC, affect the RIGHT frontal cortex, posterior frontal cortex, and subcortical white matter consistent with acute nonhemorrhagic infarcts. One area of acute infarction, roughly 8 mm in diameter, corresponds with the abnormality noted on CT earlier today. The second area questioned on earlier CT, in the RIGHT basal ganglia, represents an area of chronic infarction. No mass lesion, hydrocephalus, or extra-axial fluid. Generalized atrophy. Moderately advanced T2 and FLAIR hyperintensities throughout the white matter, likely small vessel disease. Vascular: Reported separately. Skull and upper cervical spine: No acute findings. Partial empty sella. Sinuses/Orbits: Mucosal thickening.  Negative orbits. Other: None. MR CIRCLE OF WILLIS FINDINGS RIGHT ICA: Nonstenotic irregularity in its cavernous segment. Otherwise widely patent. M1 MCA widely patent. A1  ACA widely patent. High-grade 75% or greater stenosis of the superior division M2 MCA on the RIGHT. See series 5 image 122. This is likely contributory to the observed pattern of infarction. LEFT ICA: 50% cavernous stenosis. 50-75% stenosis at the junction of the cavernous and supraclinoid ICA. LEFT M1 MCA widely patent. 50% stenosis origin LEFT A1 ACA. Nonstenotic irregularity LEFT M2 vessels. Distal anterior cerebral arteries are mildly irregular but patent. Basilar artery mildly irregular but patent. RIGHT vertebral dominant contributor. LEFT vertebral diminutive but patent. Fetal origin LEFT PCA. RIGHT PCA widely patent. No cerebellar branch occlusion. No saccular aneurysm. MRA NECK FINDINGS Dolichoectatic great vessels, but no proximal stenosis. Nonstenotic irregularity RIGHT carotid bifurcation. Nonstenotic irregularity LEFT carotid bifurcation. Both vertebral  arteries are patent without significant ostial stenosis, RIGHT dominant. IMPRESSION: Multifocal areas of acute infarction, RIGHT MCA territory, affect the RIGHT frontal and posterior frontal cortex and subcortical white matter. High-grade stenosis of the RIGHT M2 MCA superior division is likely contributory to the observed pattern of ischemia. 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 No intracranial hemorrhage or mass. 50-75% LEFT ICA cavernous/supraclinoid stenosis; otherwise, no proximal flow-limiting stenosis of the carotid or basilar arteries. Extracranial vasculature is dolichoectatic without flow-limiting stenosis, dissection, or occlusion. Minor atheromatous change both carotid bifurcations. Electronically Signed   By: Elsie Stain M.D.   On: 10/22/2018 14:22   Mr Brain Wo Contrast  Result Date: 10/22/2018 CLINICAL DATA:  Stroke-like symptoms. Slipped off bed, and could not get off floor. Reportedly, LEFT hemiparesis for 2 days, but patient did not want to come to hospital. Stroke risk factors include uncontrolled type 2 diabetes mellitus, essential hypertension and dyslipidemia. EXAM: MR HEAD WITHOUT CONTRAST MR CIRCLE OF WILLIS WITHOUT CONTRAST MRA OF THE NECK WITHOUT AND WITH CONTRAST TECHNIQUE: Multiplanar, multiecho pulse sequences of the brain, circle of Willis and surrounding structures were obtained without intravenous contrast. Angiographic images of the neck were obtained using MRA technique without and with intravenous contrast. CONTRAST:  Gadavist 10 mL. COMPARISON:  Noncontrast CT head earlier today. FINDINGS: MR HEAD FINDINGS Brain: Multifocal areas of restricted diffusion, corresponding low ADC, affect the RIGHT frontal cortex, posterior frontal cortex, and subcortical white matter consistent with acute nonhemorrhagic infarcts. One area of acute infarction, roughly 8 mm in diameter, corresponds with the abnormality noted on CT earlier today.  The second area questioned on earlier CT, in the RIGHT basal ganglia, represents an area of chronic infarction. No mass lesion, hydrocephalus, or extra-axial fluid. Generalized atrophy. Moderately advanced T2 and FLAIR hyperintensities throughout the white matter, likely small vessel disease. Vascular: Reported separately. Skull and upper cervical spine: No acute findings. Partial empty sella. Sinuses/Orbits: Mucosal thickening.  Negative orbits. Other: None. MR CIRCLE OF WILLIS FINDINGS RIGHT ICA: Nonstenotic irregularity in its cavernous segment. Otherwise widely patent. M1 MCA widely patent. A1 ACA widely patent. High-grade 75% or greater stenosis of the superior division M2 MCA on the RIGHT. See series 5 image 122. This is likely contributory to the observed pattern of infarction. LEFT ICA: 50% cavernous stenosis. 50-75% stenosis at the junction of the cavernous and supraclinoid ICA. LEFT M1 MCA widely patent. 50% stenosis origin LEFT A1 ACA. Nonstenotic irregularity LEFT M2 vessels. Distal anterior cerebral arteries are mildly irregular but patent. Basilar artery mildly irregular but patent. RIGHT vertebral dominant contributor. LEFT vertebral diminutive but patent. Fetal origin LEFT PCA. RIGHT PCA widely patent. No cerebellar branch occlusion. No saccular aneurysm. MRA NECK FINDINGS Dolichoectatic great vessels, but no  proximal stenosis. Nonstenotic irregularity RIGHT carotid bifurcation. Nonstenotic irregularity LEFT carotid bifurcation. Both vertebral arteries are patent without significant ostial stenosis, RIGHT dominant. IMPRESSION: Multifocal areas of acute infarction, RIGHT MCA territory, affect the RIGHT frontal and posterior frontal cortex and subcortical white matter. High-grade stenosis of the RIGHT M2 MCA superior division is likely contributory to the observed pattern of ischemia. 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 No intracranial hemorrhage or  mass. 50-75% LEFT ICA cavernous/supraclinoid stenosis; otherwise, no proximal flow-limiting stenosis of the carotid or basilar arteries. Extracranial vasculature is dolichoectatic without flow-limiting stenosis, dissection, or occlusion. Minor atheromatous change both carotid bifurcations. Electronically Signed   By: Elsie Stain M.D.   On: 10/22/2018 14:22    Medications:     stroke: mapping our early stages of recovery book   Does not apply Once   aspirin EC  81 mg Oral Daily   atorvastatin  40 mg Oral q1800   clopidogrel  75 mg Oral Daily   enoxaparin (LOVENOX) injection  40 mg Subcutaneous Q24H   insulin aspart  0-15 Units Subcutaneous TID WC   insulin aspart  0-5 Units Subcutaneous QHS   insulin glargine  30 Units Subcutaneous QHS   methocarbamol  500 mg Oral Daily   traZODone  100 mg Oral QHS   Continuous Infusions:  sodium chloride 50 mL/hr at 10/22/18 1115     LOS: 2 days   Joseph Art  [facility]   How to contact the [facility] Attending or Consulting provider 7A - 7P or covering provider during after hours 7P -7A, for this patient?  1. Check the care team in [facility] and look for a) attending/consulting [facility] provider listed and b) the [facility] team listed 2. Log into [email or web address] and use [facility]'s universal password to access. If you do not have the password, please contact the hospital operator. 3. Locate the [facility] provider you are looking for under [facility] and page to a number that you can be directly reached. 4. If you still have difficulty reaching the provider, please page the [facility] (Director on Call) for the Hospitalists listed on amion for assistance.  10/24/2018, 12:16 PM

## 2018-10-25 ENCOUNTER — Other Ambulatory Visit: Payer: Self-pay

## 2018-10-25 ENCOUNTER — Encounter (HOSPITAL_COMMUNITY): Payer: Self-pay | Admitting: Cardiology

## 2018-10-25 ENCOUNTER — Inpatient Hospital Stay (HOSPITAL_COMMUNITY)
Admission: RE | Admit: 2018-10-25 | Discharge: 2018-11-01 | DRG: 057 | Disposition: A | Discharge status: Home Health | Payer: HMO | Source: Intra-hospital | Attending: Physical Medicine & Rehabilitation | Admitting: Physical Medicine & Rehabilitation

## 2018-10-25 DIAGNOSIS — E1151 Type 2 diabetes mellitus with diabetic peripheral angiopathy without gangrene: Secondary | ICD-10-CM | POA: Diagnosis present

## 2018-10-25 DIAGNOSIS — E6609 Other obesity due to excess calories: Secondary | ICD-10-CM | POA: Diagnosis present

## 2018-10-25 DIAGNOSIS — E1122 Type 2 diabetes mellitus with diabetic chronic kidney disease: Secondary | ICD-10-CM | POA: Diagnosis present

## 2018-10-25 DIAGNOSIS — Z880 Allergy status to penicillin: Secondary | ICD-10-CM | POA: Diagnosis not present

## 2018-10-25 DIAGNOSIS — F329 Major depressive disorder, single episode, unspecified: Secondary | ICD-10-CM | POA: Diagnosis not present

## 2018-10-25 DIAGNOSIS — I69322 Dysarthria following cerebral infarction: Secondary | ICD-10-CM

## 2018-10-25 DIAGNOSIS — Z79899 Other long term (current) drug therapy: Secondary | ICD-10-CM | POA: Diagnosis not present

## 2018-10-25 DIAGNOSIS — Z87891 Personal history of nicotine dependence: Secondary | ICD-10-CM | POA: Diagnosis not present

## 2018-10-25 DIAGNOSIS — E785 Hyperlipidemia, unspecified: Secondary | ICD-10-CM | POA: Diagnosis not present

## 2018-10-25 DIAGNOSIS — E876 Hypokalemia: Secondary | ICD-10-CM | POA: Diagnosis not present

## 2018-10-25 DIAGNOSIS — Z823 Family history of stroke: Secondary | ICD-10-CM | POA: Diagnosis not present

## 2018-10-25 DIAGNOSIS — N183 Chronic kidney disease, stage 3 unspecified: Secondary | ICD-10-CM

## 2018-10-25 DIAGNOSIS — K5901 Slow transit constipation: Secondary | ICD-10-CM

## 2018-10-25 DIAGNOSIS — Z794 Long term (current) use of insulin: Secondary | ICD-10-CM

## 2018-10-25 DIAGNOSIS — R7309 Other abnormal glucose: Secondary | ICD-10-CM

## 2018-10-25 DIAGNOSIS — I69392 Facial weakness following cerebral infarction: Secondary | ICD-10-CM | POA: Diagnosis not present

## 2018-10-25 DIAGNOSIS — E1142 Type 2 diabetes mellitus with diabetic polyneuropathy: Secondary | ICD-10-CM

## 2018-10-25 DIAGNOSIS — I169 Hypertensive crisis, unspecified: Secondary | ICD-10-CM | POA: Diagnosis not present

## 2018-10-25 DIAGNOSIS — I63511 Cerebral infarction due to unspecified occlusion or stenosis of right middle cerebral artery: Secondary | ICD-10-CM

## 2018-10-25 DIAGNOSIS — Z6832 Body mass index (BMI) 32.0-32.9, adult: Secondary | ICD-10-CM

## 2018-10-25 DIAGNOSIS — D62 Acute posthemorrhagic anemia: Secondary | ICD-10-CM | POA: Diagnosis not present

## 2018-10-25 DIAGNOSIS — E1165 Type 2 diabetes mellitus with hyperglycemia: Secondary | ICD-10-CM | POA: Diagnosis not present

## 2018-10-25 DIAGNOSIS — I129 Hypertensive chronic kidney disease with stage 1 through stage 4 chronic kidney disease, or unspecified chronic kidney disease: Secondary | ICD-10-CM | POA: Diagnosis not present

## 2018-10-25 DIAGNOSIS — I10 Essential (primary) hypertension: Secondary | ICD-10-CM | POA: Diagnosis not present

## 2018-10-25 DIAGNOSIS — I69354 Hemiplegia and hemiparesis following cerebral infarction affecting left non-dominant side: Secondary | ICD-10-CM | POA: Diagnosis not present

## 2018-10-25 DIAGNOSIS — R0989 Other specified symptoms and signs involving the circulatory and respiratory systems: Secondary | ICD-10-CM | POA: Diagnosis not present

## 2018-10-25 LAB — BASIC METABOLIC PANEL
Anion gap: 11 (ref 5–15)
BUN: 16 mg/dL (ref 8–23)
CO2: 26 mmol/L (ref 22–32)
Calcium: 7.4 mg/dL — ABNORMAL LOW (ref 8.9–10.3)
Chloride: 104 mmol/L (ref 98–111)
Creatinine, Ser: 1.14 mg/dL — ABNORMAL HIGH (ref 0.44–1.00)
GFR calc Af Amer: 56 mL/min — ABNORMAL LOW (ref >60)
GFR calc non Af Amer: 48 mL/min — ABNORMAL LOW (ref >60)
Glucose, Bld: 132 mg/dL — ABNORMAL HIGH (ref 70–99)
Potassium: 2.8 mmol/L — ABNORMAL LOW (ref 3.5–5.1)
Sodium: 141 mmol/L (ref 135–145)

## 2018-10-25 LAB — CBC
HCT: 29.9 % — ABNORMAL LOW (ref 36.0–46.0)
Hemoglobin: 9.2 g/dL — ABNORMAL LOW (ref 12.0–15.0)
MCH: 28.2 pg (ref 26.0–34.0)
MCHC: 30.8 g/dL (ref 30.0–36.0)
MCV: 91.7 fL (ref 80.0–100.0)
Platelets: 200 10*3/uL (ref 150–400)
RBC: 3.26 MIL/uL — ABNORMAL LOW (ref 3.87–5.11)
RDW: 13.6 % (ref 11.5–15.5)
WBC: 5.5 10*3/uL (ref 4.0–10.5)
nRBC: 0 % (ref 0.0–0.2)

## 2018-10-25 LAB — GLUCOSE, CAPILLARY
Glucose-Capillary: 121 mg/dL — ABNORMAL HIGH (ref 70–99)
Glucose-Capillary: 180 mg/dL — ABNORMAL HIGH (ref 70–99)
Glucose-Capillary: 191 mg/dL — ABNORMAL HIGH (ref 70–99)
Glucose-Capillary: 175 mg/dL — ABNORMAL HIGH (ref 70–99)

## 2018-10-25 MED ORDER — INSULIN ASPART 100 UNIT/ML ~~LOC~~ SOLN
0.0000 [IU] | Freq: Three times a day (TID) | SUBCUTANEOUS | Status: DC
  Administered 2018-10-25 – 2018-10-26 (×2): 2 [IU] via SUBCUTANEOUS
  Administered 2018-10-26 – 2018-10-27 (×2): 3 [IU] via SUBCUTANEOUS
  Administered 2018-10-28 (×2): 2 [IU] via SUBCUTANEOUS
  Administered 2018-10-28: 5 [IU] via SUBCUTANEOUS
  Administered 2018-10-29: 3 [IU] via SUBCUTANEOUS
  Administered 2018-10-29 – 2018-10-30 (×3): 2 [IU] via SUBCUTANEOUS
  Administered 2018-10-31: 5 [IU] via SUBCUTANEOUS

## 2018-10-25 MED ORDER — CLOPIDOGREL BISULFATE 75 MG PO TABS: 75.0000 mg | ORAL_TABLET | Freq: Every day | ORAL | 0 refills | Status: DC

## 2018-10-25 MED ORDER — INSULIN GLARGINE 100 UNIT/ML ~~LOC~~ SOLN
30.0000 [IU] | Freq: Every day | SUBCUTANEOUS | Status: DC
  Administered 2018-10-25 – 2018-10-31 (×7): 30 [IU] via SUBCUTANEOUS
  Filled 2018-10-25 (×8): qty 0.3

## 2018-10-25 MED ORDER — ACETAMINOPHEN 325 MG PO TABS
650.0000 mg | ORAL_TABLET | ORAL | Status: DC | PRN
  Administered 2018-10-27 – 2018-11-01 (×4): 650 mg via ORAL
  Filled 2018-10-25 (×4): qty 2

## 2018-10-25 MED ORDER — POLYETHYLENE GLYCOL 3350 17 G PO PACK
17.0000 g | PACK | Freq: Every day | ORAL | Status: DC
  Administered 2018-10-26 – 2018-10-28 (×3): 17 g via ORAL
  Filled 2018-10-25 (×3): qty 1

## 2018-10-25 MED ORDER — ASPIRIN EC 81 MG PO TBEC
81.0000 mg | DELAYED_RELEASE_TABLET | Freq: Every day | ORAL | Status: DC
  Administered 2018-10-26 – 2018-11-01 (×7): 81 mg via ORAL
  Filled 2018-10-25 (×7): qty 1

## 2018-10-25 MED ORDER — ASPIRIN 81 MG PO TBEC: 81.0000 mg | DELAYED_RELEASE_TABLET | Freq: Every day | ORAL | Status: AC

## 2018-10-25 MED ORDER — TRAZODONE HCL 50 MG PO TABS
100.0000 mg | ORAL_TABLET | Freq: Every day | ORAL | Status: DC
  Administered 2018-10-25 – 2018-10-31 (×7): 100 mg via ORAL
  Filled 2018-10-25 (×7): qty 2

## 2018-10-25 MED ORDER — CLOPIDOGREL BISULFATE 75 MG PO TABS
75.0000 mg | ORAL_TABLET | Freq: Every day | ORAL | Status: DC
  Administered 2018-10-26 – 2018-11-01 (×7): 75 mg via ORAL
  Filled 2018-10-25 (×7): qty 1

## 2018-10-25 MED ORDER — ATORVASTATIN CALCIUM 40 MG PO TABS: 40.0000 mg | ORAL_TABLET | Freq: Every day | ORAL | Status: DC

## 2018-10-25 MED ORDER — ENOXAPARIN SODIUM 40 MG/0.4ML ~~LOC~~ SOLN: 40.0000 mg | SUBCUTANEOUS | Status: DC

## 2018-10-25 MED ORDER — SENNOSIDES-DOCUSATE SODIUM 8.6-50 MG PO TABS
1.0000 | ORAL_TABLET | Freq: Every evening | ORAL | Status: DC | PRN
  Administered 2018-10-25: 1 via ORAL
  Filled 2018-10-25: qty 1

## 2018-10-25 MED ORDER — ATORVASTATIN CALCIUM 40 MG PO TABS
40.0000 mg | ORAL_TABLET | Freq: Every day | ORAL | Status: DC
  Administered 2018-10-25 – 2018-10-31 (×7): 40 mg via ORAL
  Filled 2018-10-25 (×7): qty 1

## 2018-10-25 MED ORDER — POTASSIUM CHLORIDE CRYS ER 20 MEQ PO TBCR
40.0000 meq | EXTENDED_RELEASE_TABLET | ORAL | Status: AC
  Administered 2018-10-25 (×2): 40 meq via ORAL
  Filled 2018-10-25 (×2): qty 2

## 2018-10-25 MED ORDER — SORBITOL 70 % SOLN
30.0000 mL | Freq: Every day | Status: DC | PRN
  Administered 2018-10-28: 30 mL via ORAL
  Filled 2018-10-25 (×2): qty 30

## 2018-10-25 MED ORDER — ALPRAZOLAM 0.5 MG PO TABS
0.5000 mg | ORAL_TABLET | Freq: Every day | ORAL | Status: DC | PRN
  Administered 2018-10-29: 0.5 mg via ORAL
  Filled 2018-10-25: qty 1

## 2018-10-25 MED ORDER — ENOXAPARIN SODIUM 40 MG/0.4ML ~~LOC~~ SOLN
40.0000 mg | SUBCUTANEOUS | Status: DC
  Administered 2018-10-26 – 2018-10-31 (×6): 40 mg via SUBCUTANEOUS
  Filled 2018-10-25 (×6): qty 0.4

## 2018-10-25 MED ORDER — ACETAMINOPHEN 160 MG/5ML PO SOLN: 650.0000 mg | ORAL | Status: DC | PRN

## 2018-10-25 MED ORDER — POLYETHYLENE GLYCOL 3350 17 G PO PACK
17.0000 g | PACK | Freq: Every day | ORAL | Status: DC
  Administered 2018-10-25: 17 g via ORAL
  Filled 2018-10-25: qty 1

## 2018-10-25 MED ORDER — POLYETHYLENE GLYCOL 3350 17 G PO PACK: 17.0000 g | PACK | Freq: Every day | ORAL | 0 refills | Status: DC | PRN

## 2018-10-25 MED ORDER — METHOCARBAMOL 500 MG PO TABS
500.0000 mg | ORAL_TABLET | Freq: Every day | ORAL | Status: DC
  Administered 2018-10-26 – 2018-11-01 (×7): 500 mg via ORAL
  Filled 2018-10-25 (×7): qty 1

## 2018-10-25 MED ORDER — ACETAMINOPHEN 650 MG RE SUPP: 650.0000 mg | RECTAL | Status: DC | PRN

## 2018-10-25 NOTE — Progress Notes (Signed)
Inpatient Rehabilitation Admissions Coordinator  I have insurance approval and bed available to admit pt to inpt rehab today. I met with patient and she is in agreement to admit. I will contact  Dr. Broadus John to arrange. RN CM and SW made aware. I will make the arrangements to admit today.  Danne Baxter, RN, MSN Rehab Admissions Coordinator [phone] 10/25/2018 9:45 AM

## 2018-10-25 NOTE — Progress Notes (Signed)
   No vegetations on TEE. Reassuring.   Please let me know if we can be of further assistance.   Donato Schultz, MD

## 2018-10-25 NOTE — Progress Notes (Signed)
Physical Therapy Treatment Patient Details Name: Cynthia Hunt MRN: [id number] DOB: September 06, 1945 Today's Date: 10/25/2018    History of Present Illness Cynthia Hunt is an 73 y.o. female  with medical history significant of HTN; HLD: DM; and depression presenting with stroke-like symptoms. She slipped off the bed and couldn't get off the floor.CT-CT with R basal ganglia and centrum semiovale hypodensities c/w acute/subacute CVA    PT Comments    Pt making steady progress with functional mobility. She fatigues easily, especially with L LE during gait. Pt required several standing rest breaks throughout. Pt also with noted soreness in bilateral shoulders. Pt remains an excellent candidate for CIR to maximize her independence with functional mobility prior to returning home. Pt would continue to benefit from skilled physical therapy services at this time while admitted and after d/c to address the below listed limitations in order to improve overall safety and independence with functional mobility.    Follow Up Recommendations  CIR     Equipment Recommendations  None recommended by PT    Recommendations for Other Services       Precautions / Restrictions Precautions Precautions: Fall Restrictions Weight Bearing Restrictions: No    Mobility  Bed Mobility               General bed mobility comments: pt OOB in recliner chair upon arrival  Transfers Overall transfer level: Needs assistance Equipment used: None Transfers: Sit to/from Stand Sit to Stand: Min guard         General transfer comment: good technique, use of momentum, min guard for safety  Ambulation/Gait Ambulation/Gait assistance: Min guard;Min assist Gait Distance (Feet): 100 Feet Assistive device: None;Rolling walker (2 wheeled) Gait Pattern/deviations: Step-to pattern;Step-through pattern;Decreased step length - right;Decreased stance time - left;Decreased stride length;Decreased weight shift to  left Gait velocity: decreased   General Gait Details: pt with modest instability with fatigue of L LE, noted minor buckling of L LE, improved stability and gait pattern with RW   Stairs             Wheelchair Mobility    Modified Rankin (Stroke Patients Only) Modified Rankin (Stroke Patients Only) Pre-Morbid Rankin Score: No symptoms Modified Rankin: Moderately severe disability     Balance Overall balance assessment: Needs assistance Sitting-balance support: No upper extremity supported;Feet supported Sitting balance-Leahy Scale: Good     Standing balance support: No upper extremity supported;During functional activity Standing balance-Leahy Scale: Fair                              Cognition Arousal/Alertness: Awake/alert Behavior During Therapy: WFL for tasks assessed/performed Overall Cognitive Status: Impaired/Different from baseline Area of Impairment: Safety/judgement;Problem solving                         Safety/Judgement: Decreased awareness of deficits   Problem Solving: Difficulty sequencing;Requires verbal cues        Exercises      General Comments        Pertinent Vitals/Pain Pain Assessment: No/denies pain    Home Living                      Prior Function            PT Goals (current goals can now be found in the care plan section) Acute Rehab PT Goals PT Goal Formulation: With patient Time For Goal Achievement:  11/06/18 Potential to Achieve Goals: Good Progress towards PT goals: Progressing toward goals    Frequency    Min 4X/week      PT Plan Current plan remains appropriate    Co-evaluation              AM-PAC PT "6 Clicks" Mobility   Outcome Measure  Help needed turning from your back to your side while in a flat bed without using bedrails?: A Little Help needed moving from lying on your back to sitting on the side of a flat bed without using bedrails?: A Little Help needed  moving to and from a bed to a chair (including a wheelchair)?: A Little Help needed standing up from a chair using your arms (e.g., wheelchair or bedside chair)?: A Little Help needed to walk in hospital room?: A Little Help needed climbing 3-5 steps with a railing? : A Lot 6 Click Score: 17    End of Session Equipment Utilized During Treatment: Gait belt Activity Tolerance: Patient tolerated treatment well Patient left: in chair;with call bell/phone within reach;with chair alarm set Nurse Communication: Mobility status PT Visit Diagnosis: Unsteadiness on feet (R26.81);Other abnormalities of gait and mobility (R26.89);Muscle weakness (generalized) (M62.81)     Time: [id number] PT Time Calculation (min) (ACUTE ONLY): 20 min  Charges:  $Gait Training: 8-22 mins                     Deborah ChalkJennifer Bosten [place], [place]PT, DPT  Acute Rehabilitation Services Pager [phone] Office [phone]     Alessandra BevelsJennifer M Cicily Bonano 10/25/2018, 10:40 AM

## 2018-10-25 NOTE — Progress Notes (Signed)
  Speech Language Pathology Treatment: Cognitive-Linquistic  Patient Details Name: Cynthia Hunt MRN: [id number] DOB: 04-May-1946 Today's Date: 10/25/2018 Time: 3300-7622 SLP Time Calculation (min) (ACUTE ONLY): 28 min  Assessment / Plan / Recommendation Clinical Impression  Pt currently has discharge orders to go to CIR and expressed that she has to "get out of there quick" because her insurance will not cover the whole bill. She completed a simple executive function (split word) task with 67% accuracy increasing to 100% with min. cues. She required min-mod cues for daily completion of schedule task. She demonstrated 80% accuracy with 3-word sequencing mental manipulation task and 60% accuracy with 4-words increasing to 100% accuracy with min-mod. cues. SLP will continue to follow pt.    HPI HPI: Cynthia Hunt is a 73 y.o. female with medical history significant of HTN; HLD: DM; and depression presenting with stroke-like symptoms. 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 Vear Clock, MS, CCC-SLP Acute Rehabilitation Services Office number [phone] Pager [phone]                 Scheryl Marten 10/25/2018, 11:53 AM

## 2018-10-25 NOTE — Progress Notes (Signed)
Progress Note  Patient Name: Cynthia Hunt Date of Encounter: 10/25/2018  Primary Cardiologist: New-Dr. Donato SchultzMark Shanvi Moyd, MD   Subjective   Improved, no cp, no sob  Inpatient Medications    Scheduled Meds: .  stroke: mapping our early stages of recovery book   Does not apply Once  . aspirin EC  81 mg Oral Daily  . atorvastatin  40 mg Oral q1800  . clopidogrel  75 mg Oral Daily  . enoxaparin (LOVENOX) injection  40 mg Subcutaneous Q24H  . hydrALAZINE  10 mg Intravenous Once  . insulin aspart  0-15 Units Subcutaneous TID WC  . insulin aspart  0-5 Units Subcutaneous QHS  . insulin glargine  30 Units Subcutaneous QHS  . methocarbamol  500 mg Oral Daily  . potassium chloride  40 mEq Oral Q2H  . traZODone  100 mg Oral QHS   Continuous Infusions: . sodium chloride 50 mL/hr at 10/22/18 1115   PRN Meds: acetaminophen **OR** acetaminophen (TYLENOL) oral liquid 160 mg/5 mL **OR** acetaminophen, ALPRAZolam, senna-docusate   Vital Signs    Vitals:   10/24/18 1929 10/25/18 0009 10/25/18 0336 10/25/18 0712  BP: (!) 203/94 (!) 170/61 (!) 179/84 (!) 167/76  Pulse: (!) 101 (!) 57 80 76  Resp: 18 18 18 18   Temp: 97.8 F (36.6 C) 98.6 F (37 C) 98.7 F (37.1 C)   TempSrc: Oral Oral Oral Oral  SpO2: 98% 100% 99% 99%  Weight:      Height:       No intake or output data in the 24 hours ending 10/25/18 0803 Filed Weights   10/22/18 0752  Weight: 93 kg    Physical Exam   GEN: Well nourished, well developed, in no acute distress.  HEENT: Grossly normal.  Neck: Supple, no JVD, carotid bruits, or masses. Cardiac: RRR, no murmurs, rubs, or gallops. No clubbing, cyanosis, edema.  Radials/DP/PT 2+ and equal bilaterally.  Respiratory:  Respirations regular and unlabored, clear to auscultation bilaterally. GI: Soft, nontender, nondistended, BS + x 4. MS: no deformity or atrophy. Skin: warm and dry, no rash. Neuro:  Strength and sensation are intact. Psych: AAOx3.  Normal affect.   Labs    Chemistry Recent Labs  Lab 10/22/18 0755 10/22/18 0759 10/25/18 0502  NA 140  --  141  K 3.4*  --  2.8*  CL 100  --  104  CO2 22  --  26  GLUCOSE 175*  --  132*  BUN 18  --  16  CREATININE 1.34* 1.10* 1.14*  CALCIUM 8.2*  --  7.4*  PROT 7.7  --   --   ALBUMIN 3.5  --   --   AST 22  --   --   ALT 20  --   --   ALKPHOS 49  --   --   BILITOT 0.8  --   --   GFRNONAA 39*  --  48*  GFRAA 46*  --  56*  ANIONGAP 18*  --  11     Hematology Recent Labs  Lab 10/22/18 0755 10/25/18 0502  WBC 7.4 5.5  RBC 3.71* 3.26*  HGB 10.5* 9.2*  HCT 35.0* 29.9*  MCV 94.3 91.7  MCH 28.3 28.2  MCHC 30.0 30.8  RDW 13.6 13.6  PLT 238 200    Cardiac EnzymesNo results for input(s): TROPONINI in the last 168 hours. No results for input(s): TROPIPOC in the last 168 hours.   BNPNo results for input(s): BNP,  PROBNP in the last 168 hours.   DDimer No results for input(s): DDIMER in the last 168 hours.   Radiology    No results found.  Telemetry    NSR - Personally Reviewed  ECG    No new tracing as of 10/25/2018- Personally Reviewed  Cardiac Studies   Echocardiogram 10/22/2018: 1. The aortic valve is abnormal. Aortic valve regurgitation is trivial by color flow Doppler. There is leaflet thickening along the coaptation points centrally.Cannot exclude aortic valve vegetations. Differential diagnosis includes nonbacterial  thrombotic endocarditis, infective endocarditis, papillary fibroelastoma, or valve degeneration. Consider cardiology consultation with consideration for further imaging if clinically indicated in the setting of stroke. 2. The left ventricle has normal systolic function, with an ejection fraction of 55-60%. The cavity size was normal. There is mildly increased left ventricular wall thickness. 3. The right ventricle has normal systolc function. The cavity was normal. There is no increase in right ventricular wall thickness. 4. Mild thickening of the mitral  valve leaflet. 5. The inferior vena cava was normal in size with <50% respiratory variability.   TEE with bubble study 10/24/2018: Left Ventricle: EF 55% normal  Mitral Valve: Normal, trace MR  Aortic Valve: Mildly thickened, no vegetation  Tricuspid Valve: Normal, trace TR  Left Atrium: Normal appendage, no thrombus  Bubble Contrast Study: negative, no shunt  Reassuring.  Patient Profile     73 y.o. female with a hx of strokewho is being seen today for the evaluation of abnormal ECHO, aortic valveat the request of Dr. Pearlean Brownie.  Assessment & Plan    1.  Acute stroke with abnormal echocardiogram: -Tee performed 10/24/2018 with normal LVEF at 55%, trace MR, mild thickening but no aortic valve vegetation, normal tricuspid valve, no appendage thrombus and negative bubble contrast study. Overall, very reassuring study -Continue ASA 81, Plavix 75 per neurology, atorvastatin 40 -Continue with OT/PT per primary team   2. HTN: -Permissive hypertension  -Home losartan currently on hold>>plans to restart today> will let primary team make that decision   3. HLD: -Last LDL 81 with goal of <70 -Continue atorvastatin 40  4. DM2: -HbA1c, 9 -SSI for glucose control while inpatient status -Possible DM coordinator for assistance -Needs to follow closely with PCP   Signed, Georgie Chard NP-C [facility] Pager: [phone] 10/25/2018, 8:03 AM     For questions or updates, please contact   Please consult [email or web address] for contact info under Cardiology/STEMI.  Personally seen and examined. Agree with above.   TEE negative.  Let me know if you need any further assistance Signing off.   Donato Schultz, MD

## 2018-10-25 NOTE — Progress Notes (Signed)
Patient is transferring to 1M. Report has been called. Patient taken up in wheelchair. Cynthia Hunt

## 2018-10-25 NOTE — H&P (Signed)
Physical Medicine and Rehabilitation Admission H&P    Chief Complaint  Patient presents with  . stroke-like sxs    happened on Friday  chief complaint:weakness  HPI: Cynthia BraySheila M Hunt is a 73 year old right-handed female with history of hypertension maintained on HCTZ 25 mg daily, Cozaar 50 mg daily, diabetes mellitus maintained on Glucophage 1000 mg daily as well as basaglar 35 units daily at bedtime, hyperlipidemia, marijuana use. Patient quit smoking 12 years ago.  Per chart review and patient, patient lives with her 73 year old Scientist, forensicgoddaughter. Independent driving prior to admission. Goddaughter assist as needed. Presented 10/22/2018 with headache and left-sided weakness x2 days.  On presentation, blood pressure was noted to systolic 180s-200s.Cranial CT scan showed age indeterminate right basal ganglia and right centrum semiovale ovale hypodensities. MRI brain ordered, personally reviewed, multiple small right brain infarcts.  MRI/MRA brain per report suggesting multifocal areas of acute infarction right MCA territory affecting the right frontal and posterior frontal cortex and subcortical white matter. 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 TEE completed 10/24/2018 showing no thrombus or vegetation ejection fraction 55%. Bubble contrast study negative.. Currently maintained on aspirin 81 mg daily and Plavix 75 mg daily 3 weeks than aspirin alone. Subcutaneous Lovenox for DVT prophylaxis.  Hospital course further complicated by significant persistent hypokalemia, supplement initiated.  Acute on chronic anemia hemoglobin 9.2. Therapy evaluations completed with recommendations of physical medicine rehabilitation consult. Patient was admitted for a comprehensive rehabilitation program.  Review of Systems   Constitutional: Negative for chills and fever.  HENT: Negative for hearing loss.   Eyes: Negative for blurred vision and double vision.  Respiratory: Negative for cough and shortness of breath.   Cardiovascular: Negative for chest pain and palpitations.  Gastrointestinal: Positive for constipation. Negative for heartburn, nausea and vomiting.  Genitourinary: Negative for dysuria, flank pain and hematuria.  Musculoskeletal: Positive for myalgias.  Skin: Negative for rash.  Neurological: Positive for speech change, focal weakness, weakness and headaches.  Psychiatric/Behavioral: Positive for depression. The patient has insomnia.   All other systems reviewed and are negative.  Past Medical History:  Diagnosis Date  . Depression   . Diabetes mellitus without complication (HCC)   . Hyperlipidemia   . Hypertension    Past Surgical History:  Procedure Laterality Date  . BUBBLE STUDY  10/24/2018   Procedure: BUBBLE STUDY;  Surgeon: Jake BatheSkains, Mark C, MD;  [facility];  Service: Cardiovascular;;  . TEE WITHOUT CARDIOVERSION N/A 10/24/2018   Procedure: TRANSESOPHAGEAL ECHOCARDIOGRAM (TEE);  Surgeon: Jake BatheSkains, Mark C, MD;  [facility];  Service: Cardiovascular;  Laterality: N/A;  . UTERINE FIBROID SURGERY     Family History  Problem Relation Age of Onset  . Stroke Mother    Social History:  reports that she quit smoking about 12 years ago. She has never used smokeless tobacco. She reports current alcohol use. She reports previous drug use. Drug: Marijuana. Allergies:  Allergies  Allergen Reactions  . Penicillins Other (See Comments)    Childhood Allergy Did it involve swelling of the face/tongue/throat, SOB, or low BP? Unknown Did it involve sudden or severe rash/hives, skin peeling, or any reaction on the inside of your mouth or nose? Unknown Did you need to seek medical attention at a hospital or doctor's office? Unknown When did it last happen?childhood If all  above answers are "NO", may  proceed with cephalosporin use.    Medications Prior to Admission  Medication Sig Dispense Refill  . acetaminophen (TYLENOL) 500 MG tablet Take 1,000 mg by mouth daily.    . Alcohol Swabs (ALCOHOL PREP) 70 % PADS     . ALPRAZolam (XANAX) 0.5 MG tablet Take 0.5 mg by mouth daily as needed (anxiety while driving).     Melene Muller. [START ON 10/26/2018] aspirin EC 81 MG EC tablet Take 1 tablet (81 mg total) by mouth daily.    Marland Kitchen. atorvastatin (LIPITOR) 40 MG tablet Take 1 tablet (40 mg total) by mouth daily at 6 PM.    . Biotin w/ Vitamins C & E (HAIR SKIN & NAILS GUMMIES PO) Take 1 tablet by mouth daily.    . Blood Glucose Calibration (FREESTYLE CONTROL SOLUTION) LIQD     . [START ON 10/26/2018] clopidogrel (PLAVIX) 75 MG tablet Take 1 tablet (75 mg total) by mouth daily for 21 days. 21 tablet 0  . diphenhydrAMINE (BENADRYL) 25 MG tablet Take 25 mg by mouth 3 (three) times daily.     . fluticasone (FLONASE) 50 MCG/ACT nasal spray Place 1 spray into both nostrils daily as needed for allergies or rhinitis.    Marland Kitchen. FREESTYLE LITE test strip     . Insulin Glargine (BASAGLAR KWIKPEN) 100 UNIT/ML SOPN Inject 35 Units into the skin at bedtime.     . Lancets (FREESTYLE) lancets     . losartan (COZAAR) 100 MG tablet Take 100 mg by mouth daily.     . metFORMIN (GLUCOPHAGE) 1000 MG tablet Take 1,000 mg by mouth 2 (two) times daily with a meal.     . methocarbamol (ROBAXIN) 500 MG tablet Take 500 mg by mouth 2 (two) times daily as needed for muscle spasms. Script states "not meant for daily use"    . Multiple Vitamin (MULTIVITAMIN WITH MINERALS) TABS tablet Take 1 tablet by mouth daily.    Marland Kitchen. OVER THE COUNTER MEDICATION Apply 1 application topically daily. Over the counter pain cream "Hemma" sp?    . polyethylene glycol (MIRALAX / GLYCOLAX) 17 g packet Take 17 g by mouth daily as needed. 14 each 0  . traZODone (DESYREL) 100 MG tablet Take 100 mg by mouth at bedtime.       Drug Regimen Review   Drug regimen was reviewed and remains appropriate with no significant issues identified  Home: Home Living Family/patient expects to be discharged to:: Private residence Living Arrangements: (God daughter, Forensic psychologistherice, lives with patient) Available Help at Discharge: Family, Available 24 hours/day Type of Home: House Home Access: Stairs to enter Secretary/administratorntrance Stairs-Number of Steps: 3 Entrance Stairs-Rails: None Home Layout: One level Bathroom Shower/Tub: Tub/shower unit, Engineer, building servicesCurtain Bathroom Toilet: Pharmacist, communitytandard Bathroom Accessibility: Yes Home Equipment: Medical laboratory scientific officerCane - single point  Lives With: (God daughter lives with patient for past year)   Functional History: Prior Function Level of Independence: Independent with assistive device(s) Comments: SPC, drives  Functional Status:  Mobility: Bed Mobility Overal bed mobility: Needs Assistance Bed Mobility: Supine to Sit Supine to sit: Supervision General bed mobility comments: pt OOB in recliner chair upon arrival Transfers Overall transfer level: Needs assistance Equipment used: None Transfers: Sit to/from Stand Sit to Stand: Min guard General transfer comment: good technique, use of momentum, min guard for safety Ambulation/Gait Ambulation/Gait assistance: Min guard, Min assist Gait Distance (Feet): 100 Feet Assistive device: None, Rolling walker (2 wheeled) Gait Pattern/deviations: Step-to pattern, Step-through pattern, Decreased step length - right, Decreased stance time - left, Decreased stride  length, Decreased weight shift to left General Gait Details: pt with modest instability with fatigue of L LE, noted minor buckling of L LE, improved stability and gait pattern with RW Gait velocity: decreased    ADL: ADL Overall ADL's : Needs assistance/impaired Eating/Feeding: Modified independent, Sitting Eating/Feeding Details (indicate cue type and reason): increased time Grooming: Wash/dry hands, Minimal assistance, Standing Grooming Details  (indicate cue type and reason): at sink; increased time Upper Body Bathing: Minimal assistance, Sitting Upper Body Bathing Details (indicate cue type and reason): increased time Lower Body Bathing: Minimal assistance, Sit to/from stand Lower Body Bathing Details (indicate cue type and reason): increased time Upper Body Dressing : Minimal assistance, Sitting Upper Body Dressing Details (indicate cue type and reason): increased time Lower Body Dressing: Minimal assistance, Sit to/from stand Lower Body Dressing Details (indicate cue type and reason): increased time Toilet Transfer: Minimal assistance, Ambulation, BSC, RW Toilet Transfer Details (indicate cue type and reason): over toilet Toileting- Clothing Manipulation and Hygiene: Minimal assistance, Sit to/from stand  Cognition: Cognition Overall Cognitive Status: Impaired/Different from baseline Arousal/Alertness: Awake/alert Orientation Level: Oriented X4 Attention: Focused, Sustained Focused Attention: Appears intact Sustained Attention: Appears intact Memory: Impaired Memory Impairment: Decreased recall of new information, Decreased short term memory Decreased Short Term Memory: Verbal complex, Functional complex Problem Solving: Impaired Executive Function: Organizing, Self Monitoring Organizing: Impaired Self Monitoring: Impaired Cognition Arousal/Alertness: Awake/alert Behavior During Therapy: WFL for tasks assessed/performed Overall Cognitive Status: Impaired/Different from baseline Area of Impairment: Safety/judgement, Problem solving Safety/Judgement: Decreased awareness of deficits Problem Solving: Difficulty sequencing, Requires verbal cues  Physical Exam: Blood pressure (!) 170/69, pulse 85, temperature 98.8 F (37.1 C), temperature source Oral, resp. rate 16, height 5\' 7"  (1.702 m), weight 93 kg, SpO2 98 %. Physical Exam  Vitals reviewed. Constitutional: She appears well-developed.  Obese  HENT:  Head:  Normocephalic and atraumatic.  Eyes: EOM are normal. Right eye exhibits no discharge. Left eye exhibits no discharge.  Respiratory: Effort normal. No respiratory distress.  GI: She exhibits no distension.  Musculoskeletal:     Comments: No edema or tenderness in extremities  Neurological: She is alert.  Patient alert and oriented to person place and time. Follows commands.  Fair awareness of deficits.  Makes good eye contact with examiner. Motor: Right upper extremity/right lower extremity: 5/5 proximal distal Left upper extremity: 4 medicine/5 proximal distal Left lower extremity: 3-/5 proximal distal Dysarthria Facial weakness  Skin: Skin is warm and dry.  Psychiatric: She has a normal mood and affect. Her behavior is normal.    Results for orders placed or performed during the hospital encounter of 10/22/18 (from the past 48 hour(s))  Glucose, capillary     Status: Abnormal   Collection Time: 10/23/18  9:08 PM  Result Value Ref Range   Glucose-Capillary 192 (H) 70 - 99 mg/dL  Glucose, capillary     Status: None   Collection Time: 10/24/18  6:58 AM  Result Value Ref Range   Glucose-Capillary 97 70 - 99 mg/dL  Glucose, capillary     Status: Abnormal   Collection Time: 10/24/18  1:35 PM  Result Value Ref Range   Glucose-Capillary 114 (H) 70 - 99 mg/dL  Glucose, capillary     Status: Abnormal   Collection Time: 10/24/18  9:19 PM  Result Value Ref Range   Glucose-Capillary 177 (H) 70 - 99 mg/dL  CBC     Status: Abnormal   Collection Time: 10/25/18  5:02 AM  Result Value Ref Range  WBC 5.5 4.0 - 10.5 K/uL   RBC 3.26 (L) 3.87 - 5.11 MIL/uL   Hemoglobin 9.2 (L) 12.0 - 15.0 g/dL   HCT 16.1 (L) 09.6 - 04.5 %   MCV 91.7 80.0 - 100.0 fL   MCH 28.2 26.0 - 34.0 pg   MCHC 30.8 30.0 - 36.0 g/dL   RDW 40.9 81.1 - 91.4 %   Platelets 200 150 - 400 K/uL   nRBC 0.0 0.0 - 0.2 %    Comment: Performed at [facility], [address], [place], [place] [address]  Basic metabolic  panel     Status: Abnormal   Collection Time: 10/25/18  5:02 AM  Result Value Ref Range   Sodium 141 135 - 145 mmol/L   Potassium 2.8 (L) 3.5 - 5.1 mmol/L   Chloride 104 98 - 111 mmol/L   CO2 26 22 - 32 mmol/L   Glucose, Bld 132 (H) 70 - 99 mg/dL   BUN 16 8 - 23 mg/dL   Creatinine, Ser 6.21 (H) 0.44 - 1.00 mg/dL   Calcium 7.4 (L) 8.9 - 10.3 mg/dL   GFR calc non Af Amer 48 (L) >60 mL/min   GFR calc Af Amer 56 (L) >60 mL/min   Anion gap 11 5 - 15    Comment: Performed at [facility], [address], [place], [place] [address]  Glucose, capillary     Status: Abnormal   Collection Time: 10/25/18  6:28 AM  Result Value Ref Range   Glucose-Capillary 121 (H) 70 - 99 mg/dL  Glucose, capillary     Status: Abnormal   Collection Time: 10/25/18  1:15 PM  Result Value Ref Range   Glucose-Capillary 180 (H) 70 - 99 mg/dL   No results found.   Medical Problem List and Plan: 1.  Leftside weakness secondary to right MCA cortical and subcortical infarct on 426/2020.  Admit to CIR 2.  Antithrombotics: -DVT/anticoagulation:  Lovenox  -antiplatelet therapy: aspirin 81 mg daily and Plavix 75 mg daily 3 weeks then aspirin alone 3. Pain Management: Robaxin 500 mg daily 4. Mood:  Trazodone 100 mg daily at bedtime, Xanax 0.5 mg daily as needed  -antipsychotic agents: N/A 5. Neuropsych: This patient is capable of making decisions on her own behalf. 6. Skin/Wound Care:  Routine skin checks 7. Fluids/Electrolytes/Nutrition:  Routine in and out's with follow-up BMP tomorrow a.m. 8. Hypertension. Permissive hypertension. Patient on Cozaar 100 mg daily,HCTZ 25 mg daily prior to admission. Resume as needed.  Monitor with increased mobility. 9. Diabetes mellitus peripheral neuropathy. Hemoglobin A1c 9.0.Lantus insulin 30 units daily. Check blood sugars before meals and at bedtime.  Monitor with increased mobility. 10. History of tobacco use 12 years ago as well as marijuana use. Urine drug screen not  performed on this latest admission. 11. Obesity. BMI 32.11. Dietary follow-up 12. Hyperlipidemia. Lipitor 13.  Hypokalemia: BMP ordered for tomorrow morning.  Post Admission Physician Evaluation: 1. Preadmission assessment reviewed and changes made below. 2. Functional deficits secondary  to multifocal right MCA infarcts. 3. Patient is admitted to receive collaborative, interdisciplinary care between the physiatrist, rehab nursing staff, and therapy team. 4. Patient has experienced substantial functional loss from his/her baseline which was documented above under the "Functional History" and "Functional Status" headings.  Judging by the patient's diagnosis, physical exam, and functional history, the patient has potential for functional progress which will result in measurable gains while on inpatient rehab.  These gains will be of substantial and practical  use upon discharge  in facilitating mobility and self-care at the household level. 5. Physiatrist will provide 24 hour management of medical needs as well as oversight of the therapy plan/treatment and provide guidance as appropriate regarding the interaction of the two. 6. 24 hour rehab nursing will assist with safety, disease management and patient education  and help integrate therapy concepts, techniques,education, etc. 7. PT will assess and treat for/with: Lower extremity strength, range of motion, stamina, balance, functional mobility, safety, adaptive techniques and equipment, coping skills, pain control, stroke education. Goals are: Mod I/supervision. 8. OT will assess and treat for/with: ADL's, functional mobility, safety, upper extremity strength, adaptive techniques and equipment, wound mgt, ego support, and community reintegration.   Goals are: Mod I/supervision. Therapy may proceed with showering this patient. 9. SLP will assess and treat for/with: Speech, cognition.  Goals are: Mod I/independent. 10. Case Management and Social Worker  will assess and treat for psychological issues and discharge planning. 11. Team conference will be held weekly to assess progress toward goals and to determine barriers to discharge. 12. Patient will receive at least 3 hours of therapy per day at least 5 days per week. 13. ELOS: 5- 8 days.       14. Prognosis:  good  I have personally performed a face to face diagnostic evaluation, including, but not limited to relevant history and physical exam findings, of this patient and developed relevant assessment and plan.  Additionally, I have reviewed and concur with the physician assistant's documentation above.  Maryla Morrow, MD, ABPMR Mcarthur Rossetti Angiulli, PA-C 10/25/2018

## 2018-10-25 NOTE — Evaluation (Signed)
Occupational Therapy Assessment and Plan  Patient Details  Name: Cynthia Hunt MRN: [id number] Date of Birth: Jun 26, 1946  OT Diagnosis: muscle weakness (generalized) Rehab Potential: Rehab Potential (ACUTE ONLY): Excellent ELOS: 5-7 days   Today's Date: 10/26/2018 OT Individual Time: 5701-7793 OT Individual Time Calculation (min): 70 min     Problem List:  Patient Active Problem List   Diagnosis Date Noted  . Acute blood loss anemia   . Hypertensive crisis   . 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 Hypokalemia   . Class 1 obesity due to excess calories with serious comorbidity and body mass index (BMI) of 32.0 to 32.9 in adult   . Poorly controlled type 2 diabetes mellitus with peripheral neuropathy ([place])   . Acute ischemic stroke ([place]) 10/22/2018  . Essential hypertension 10/22/2018  . Dyslipidemia 10/22/2018  . Uncontrolled type 2 diabetes mellitus without complication, with long-term current use of insulin ([place]) 02/13/2016    Past Medical History:  Past Medical History:  Diagnosis Date  . Depression   . Diabetes mellitus without complication ([place])   . Hyperlipidemia   . Hypertension    Past Surgical History:  Past Surgical History:  Procedure Laterality Date  . BUBBLE STUDY  10/24/2018   Procedure: BUBBLE STUDY;  Surgeon: Jerline Pain, MD;  Location: [facility] ENDOSCOPY;  Service: Cardiovascular;;  . TEE WITHOUT CARDIOVERSION N/A 10/24/2018   Procedure: TRANSESOPHAGEAL ECHOCARDIOGRAM (TEE);  Surgeon: Jerline Pain, MD;  Location: [facility] ENDOSCOPY;  Service: Cardiovascular;  Laterality: N/A;  . UTERINE FIBROID SURGERY      Assessment & Plan Clinical Impression: Patient is a 73 y.o. year old female with recent admission to the hospital on 10/22/2018 with headache and left-sided weakness x2 days. On presentation, blood pressure was noted to systolic [id number].Cranial CT scan showed age indeterminate right basal ganglia and right centrum semiovale ovale  hypodensities. MRI brain ordered, personally reviewed, multiple small right brain infarcts. MRI/MRA brain per report suggesting multifocal areas of acute infarction right MCA territory affecting the right frontal and posterior frontal cortex and subcortical white matter. 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  Patient transferred to CIR on 10/25/2018 .    Patient currently requires min with basic self-care skills secondary to muscle weakness, decreased coordination and decreased standing balance, hemiplegia and decreased balance strategies.  Prior to hospitalization, patient could complete BADL with independent .  Patient will benefit from skilled intervention to increase independence with basic self-care skills prior to discharge home with care partner.  Anticipate patient will require intermittent supervision and follow up home health.  OT - End of Session Endurance Deficit: Yes Endurance Deficit Description: rest breaks within BADL tasks OT Assessment Rehab Potential (ACUTE ONLY): Excellent OT Patient demonstrates impairments in the following area(s): Balance;Endurance;Motor;Safety OT Basic ADL's Functional Problem(s): Grooming;Bathing;Dressing;Toileting;Eating OT Transfers Functional Problem(s): Tub/Shower;Toilet OT Additional Impairment(s): Fuctional Use of Upper Extremity OT Plan OT Intensity: Minimum of 1-2 x/day, 45 to 90 minutes OT Frequency: 5 out of 7 days OT Duration/Estimated Length of Stay: 5-7 days OT Treatment/Interventions: Balance/vestibular training;Community reintegration;Discharge planning;DME/adaptive equipment instruction;Functional mobility training;Neuromuscular re-education;Patient/family education;Psychosocial support;Self Care/advanced ADL retraining;Therapeutic Activities;Therapeutic Exercise;UE/LE Strength  taining/ROM;UE/LE Coordination activities OT Self Feeding Anticipated Outcome(s): Independent OT Basic Self-Care Anticipated Outcome(s): Mod I OT Toileting Anticipated Outcome(s): Independent OT Bathroom Transfers Anticipated Outcome(s): Mod I OT Recommendation Patient destination: Home Follow Up Recommendations: Home health OT  Equipment Recommended: To be determined   Skilled Therapeutic Intervention OT eval completed addressing rehab process, OT purpose, POC, ELOS, and goals.  Pt greeted semi-reclined in bed and agreeable to OT treatment session. Nursing entered to remove IV, then pt came to sitting EOB with min A. Pt ambulated into bathroom with min HHA to transfer onto toilet. Pt voided bladder and completed peri-care with set-up A. Pt then ambulate min HHA to transition into shower on tub bench. Bathing completed with overall CGA for balance when standing to wash buttocks. Dressing completed seated EOB with min A for dressing L side body. Pt needed rest break, then ambulated with RW to the sink for grooming tasks. CGA for balance while completing grooming tasks at the sink. Pt left semi-reclined in bed at end of session with bed alarm on and needs met.   OT Evaluation Precautions/Restrictions  Precautions Precautions: Fall Restrictions Weight Bearing Restrictions: No Pain   denies pain Home Living/Prior Functioning Home Living Family/patient expects to be discharged to:: Private residence Living Arrangements: Alone Available Help at Discharge: Family(nephew able to provide PRN assist for errands) Type of Home: House Home Access: Stairs to enter [employer] of Steps: 1 threshold  Entrance Stairs-Rails: None Home Layout: One level Bathroom Shower/Tub: Gaffer, Optometrist: Yes  Lives With: Family(goddaughter lives with her, however is not able to help) IADL History Education: some college  IADL Comments:  Retired from working at [employer]: Independent with homemaking with ambulation, Independent with basic ADLs  Able to Take Stairs?: Yes Driving: Yes Vocation: Retired Comments: Heritage manager, cooks, Microbiologist, grocery shops ADL ADL Eating: Set up Grooming: Supervision/safety Upper Body Bathing: Supervision/safety Lower Body Bathing: Minimal assistance Upper Body Dressing: Contact guard Lower Body Dressing: Minimal assistance Toileting: Minimal assistance Toilet Transfer: Minimal assistance Social research officer, government: Minimal assistance Vision Baseline Vision/History: Wears glasses Wears Glasses: Reading only Patient Visual Report: No change from baseline Vision Assessment?: No apparent visual deficits Eye Alignment: Within Functional Limits Ocular Range of Motion: Within Functional Limits Alignment/Gaze Preference: Within Defined Limits Tracking/Visual Pursuits: Able to track stimulus in all quads without difficulty Convergence: Within functional limits Visual Fields: No apparent deficits Perception  Perception: Within Functional Limits Praxis Praxis: Intact Cognition Overall Cognitive Status: Within Functional Limits for tasks assessed Arousal/Alertness: Awake/alert Orientation Level: Person;Place;Situation Person: Oriented Place: Oriented Situation: Oriented Year: 2020 Month: April Day of Week: Correct Memory: Impaired(very mild impairments) Memory Impairment: Decreased recall of new information Decreased Short Term Memory: Verbal complex;Functional complex Immediate Memory Recall: Sock;Blue;Bed Memory Recall: Sock;Blue;Bed Memory Recall Sock: Without Cue Memory Recall Blue: Without Cue Memory Recall Bed: With Cue Attention: Focused;Sustained Focused Attention: Appears intact Sustained Attention: Appears intact Awareness: Appears intact Problem Solving: Appears intact Executive Function: Organizing;Self Monitoring Organizing:  Appears intact Self Monitoring: Appears intact Safety/Judgment: Appears intact Sensation Sensation Light Touch: Appears Intact Coordination Gross Motor Movements are Fluid and Coordinated: No Fine Motor Movements are Fluid and Coordinated: No Coordination and Movement Description: slight decreased smoothness and accuracy with L hand Finger Nose Finger Test: mild dysmetria Heel Shin Test: Mild impairment L 2/2 weakness Motor  Motor Motor: Hemiplegia Motor - Skilled Clinical Observations: Mild L hemi Mobility  Bed Mobility Bed Mobility: Rolling Right;Rolling Left;Sit to Supine;Supine to Sit Rolling Right: Supervision/verbal cueing Rolling Left: Supervision/Verbal cueing Supine to Sit: Minimal Assistance - Patient > 75% Sit to Supine: Minimal Assistance - Patient > 75% Transfers Sit to Stand: Contact Guard/Touching assist Stand  to Sit: Contact Guard/Touching assist  Trunk/Postural Assessment  Cervical Assessment Cervical Assessment: Exceptions to WFL(pt severely restricted in all planes of motion, per pt's this is baseline, requires entire trunk movements to compensate) Thoracic Assessment Thoracic Assessment: Exceptions to WFL(kyphotic) Lumbar Assessment Lumbar Assessment: Within Functional Limits Postural Control Postural Control: Within Functional Limits  Balance Balance Balance Assessed: Yes Standardized Balance Assessment Standardized Balance Assessment: Berg Balance Test Berg Balance Test Sit to Stand: Able to stand without using hands and stabilize independently Standing Unsupported: Able to stand 2 minutes with supervision Sitting with Back Unsupported but Feet Supported on Floor or Stool: Able to sit safely and securely 2 minutes Stand to Sit: Sits safely with minimal use of hands Transfers: Able to transfer safely, definite need of hands Standing Unsupported with Eyes Closed: Able to stand 10 seconds with supervision Standing Ubsupported with Feet Together: Needs  help to attain position and unable to hold for 15 seconds From Standing, Reach Forward with Outstretched Arm: Can reach forward >12 cm safely (5") From Standing Position, Pick up Object from Floor: Able to pick up shoe, needs supervision From Standing Position, Turn to Look Behind Over each Shoulder: Needs supervision when turning Turn 360 Degrees: Needs close supervision or verbal cueing Standing Unsupported, Alternately Place Feet on Step/Stool: Able to complete >2 steps/needs minimal assist Standing Unsupported, One Foot in Front: Able to take small step independently and hold 30 seconds Standing on One Leg: Tries to lift leg/unable to hold 3 seconds but remains standing independently Total Score: 33 Static Sitting Balance Static Sitting - Balance Support: No upper extremity supported;Feet supported Static Sitting - Level of Assistance: 5: Stand by assistance Dynamic Sitting Balance Dynamic Sitting - Balance Support: No upper extremity supported;During functional activity;Feet supported Dynamic Sitting - Level of Assistance: 5: Stand by assistance Sitting balance - Comments: donned socks at EOB Static Standing Balance Static Standing - Balance Support: During functional activity Static Standing - Level of Assistance: 5: Stand by assistance Dynamic Standing Balance Dynamic Standing - Balance Support: During functional activity Dynamic Standing - Level of Assistance: 4: Min assist Extremity/Trunk Assessment RUE Assessment RUE Assessment: Within Functional Limits LUE Assessment LUE Assessment: Exceptions to [facility] LUE Body System: Neuro Brunstrum levels for arm and hand: Arm;Hand Brunstrum level for arm: Stage V Relative Independence from Synergy Brunstrum level for hand: Stage VI Isolated joint movements LUE Strength LUE Overall Strength Comments: 4-/5 overall Left Shoulder Flexion: 4-/5     Refer to Care Plan for Long Term Goals  Recommendations for other services: None     Discharge Criteria: Patient will be discharged from OT if patient refuses treatment 3 consecutive times without medical reason, if treatment goals not met, if there is a change in medical status, if patient makes no progress towards goals or if patient is discharged from hospital.  The above assessment, treatment plan, treatment alternatives and goals were discussed and mutually agreed upon: by patient  Valma Cava 10/26/2018, 3:35 PM

## 2018-10-25 NOTE — TOC Transition Note (Signed)
Transition of Care [facility]) - CM/SW Discharge Note   Patient Details  Name: Cynthia Hunt MRN: [id number] Date of Birth: 06/15/1946  [facility]) CM/SW Contact:  Kermit Balo, RN Phone Number: 10/25/2018, 12:09 PM   Clinical Narrative:    Pt is discharging to CIR today. CM signing off.   Final next level of care: IP Rehab Facility Barriers to Discharge: Barriers Resolved   Patient Goals and CMS Choice Patient states their goals for this hospitalization and ongoing recovery are:: Patient has discussed her want to return home and "be back to normal." Patient likely stated her want to perform her ADLs as well as she can.      Discharge Placement                       Discharge Plan and Services                                     Social Determinants of Health (SDOH) Interventions     Readmission Risk Interventions No flowsheet data found.

## 2018-10-25 NOTE — Progress Notes (Signed)
Cynthia Hunt, Cynthia Anil, MD  Physician  Physical Medicine and Rehabilitation  PMR Pre-admission  Signed  Date of Service:  10/25/2018 11:18 AM       Related encounter: ED to Hosp-Admission (Discharged) from 10/22/2018 in [facility] [facility]      Signed         Show:Clear all [x]Manual[x]Template[x]Copied  Added by: [x],  G, RN[x]Cynthia Hunt, Cynthia Anil, MD  []Hover for details PMR Admission Coordinator Pre-Admission Assessment  Patient: Cynthia Hunt is an 72 y.o., female MRN: [id number] DOB: 01/29/1946 Height: 5' 7" (170.2 cm) Weight: 93 kg  Insurance Information HMO: yes    PPO:      PCP:      IPA:      80/20:      OTHER: medicare advantage PRIMARY: [employer]      Policy#: [id number]      Subscriber: pt CM Name: Cynthia Hunt      Phone#: [phone]     Fax#: [phone] Pre-Cert#: [id number] approved for 7 days      Employer:  Benefits:  Phone #: [phone]     Name: 10/24/2018 Eff. Date: 06/28/2018     Deduct: none      Out of Pocket Max: $5000      Life Max: none CIR: $225 co pay per day days 1 until 6      SNF: no co pay days 1 until 20; $178 co pay per day days 21 until 100 Outpatient: $20 co pay per visit     Co-Pay: visits per medical neccesity; Prior auth for OT only Home Health: 100%      Co-Pay: visits per medical neccesity DME: 80%     Co-Pay: 20% Providers: in network  SECONDARY: none        Medicaid Application Date:       Case Manager:  Disability Application Date:       Case Worker:   The "Data Collection Information Summary" for patients in Inpatient Rehabilitation Facilities with attached "Privacy Act Statement-Health Care Records" was provided and verbally reviewed with: Patient  Emergency Contact Information         Contact Information    Name Relation Home Work Mobile   Cynthia Hunt Friend [phone]  [phone]   Cynthia Hunt Nephew   [phone]      Current Medical History  Patient Admitting  Diagnosis: right basal ganglia lacunar infarct  History of Present Illness: Cynthia Hunt is a 72-year-old right-handed female with history of hypertension maintained on HCTZ 25 mg daily, Cozaar 50 mg daily, diabetes mellitus maintained on Glucophage 1000 mg daily as well as basilar 35 units daily at bedtime, hyperlipidemia, marijuana use. Patient quit smoking 12 years ago. Presented 10/22/2018 with left side weakness 2 days as well as headache. Blood pressure noted systolic 180s-200s. Cranial CT scan showed age indeterminate right basal ganglia and right centrum semi ovale hypo densities.MRI/MRA of head and neck showed multifocal areas of acute infarction right MCA territory affecting the right frontal and posterior frontal cortex and subcortical white matter. 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 50-75% left ICA cavernous supraclinoid stenosis.Patient did not receive TPA.Echocardiogram ejection fraction of 55-60% abnormal aortic valve could not exclude vegetation. TEEcompleted 10/24/2018 showing no thrombus or vegetation ejection fraction 55%. Bubble contrast study negative.. Currently maintained on aspirin 81 mg daily and Plavix 75 mg daily 3 weeks than aspirin alone. Subcutaneous Lovenox for DVT prophylaxis.Bouts of hypokalemia with supplement added. Acute on chronic anemia hemoglobin   9.2.  Complete NIHSS TOTAL: 2  Patient's medical record from [facility]  has been reviewed by the rehabilitation admission coordinator and physician.  Past Medical History      Past Medical History:  Diagnosis Date  . Depression   . Diabetes mellitus without complication ([place])   . Hyperlipidemia   . Hypertension     Family History   family history includes Stroke in her mother.  Prior Rehab/Hospitalizations Has the patient had prior rehab or hospitalizations prior to admission? Yes  Has the patient had major surgery during 100 days prior to admission? No               Current Medications  Current Facility-Administered Medications:  .   stroke: mapping our early stages of recovery book, , Does not apply, Once, Skains, Mark C, MD .  0.9 %  sodium chloride infusion, , Intravenous, Continuous, Skains, Thana Farr, MD, Last Rate: 50 mL/hr at 10/22/18 1115 .  acetaminophen (TYLENOL) tablet 650 mg, 650 mg, Oral, Q4H PRN **OR** acetaminophen (TYLENOL) solution 650 mg, 650 mg, Per Tube, Q4H PRN **OR** acetaminophen (TYLENOL) suppository 650 mg, 650 mg, Rectal, Q4H PRN, Skains, Mark C, MD .  ALPRAZolam Duanne Moron) tablet 0.5 mg, 0.5 mg, Oral, Daily PRN, Jerline Pain, MD .  aspirin EC tablet 81 mg, 81 mg, Oral, Daily, Candee Furbish C, MD, 81 mg at 10/25/18 1019 .  atorvastatin (LIPITOR) tablet 40 mg, 40 mg, Oral, q1800, Jerline Pain, MD, 40 mg at 10/24/18 2128 .  clopidogrel (PLAVIX) tablet 75 mg, 75 mg, Oral, Daily, Jerline Pain, MD, 75 mg at 10/25/18 1019 .  enoxaparin (LOVENOX) injection 40 mg, 40 mg, Subcutaneous, Q24H, Skains, Mark C, MD, 40 mg at 10/23/18 1218 .  hydrALAZINE (APRESOLINE) injection 10 mg, 10 mg, Intravenous, Once, Skains, Mark C, MD .  insulin aspart (novoLOG) injection 0-15 Units, 0-15 Units, Subcutaneous, TID WC, Jerline Pain, MD, 8 Units at 10/23/18 1749 .  insulin aspart (novoLOG) injection 0-5 Units, 0-5 Units, Subcutaneous, QHS, Jerline Pain, MD, 2 Units at 10/22/18 2308 .  insulin glargine (LANTUS) injection 30 Units, 30 Units, Subcutaneous, QHS, Jerline Pain, MD, 30 Units at 10/24/18 2129 .  methocarbamol (ROBAXIN) tablet 500 mg, 500 mg, Oral, Daily, Skains, Mark C, MD, 500 mg at 10/25/18 1019 .  polyethylene glycol (MIRALAX / GLYCOLAX) packet 17 g, 17 g, Oral, Daily, Domenic Polite, MD, 17 g at 10/25/18 1019 .  senna-docusate (Senokot-S) tablet 1 tablet, 1 tablet, Oral, QHS PRN, Jerline Pain, MD .  traZODone (DESYREL) tablet 100 mg, 100 mg, Oral, QHS, Jerline Pain, MD, 100 mg at 10/24/18 2128  Patients Current Diet:      Diet Order                  Diet Heart Room service appropriate? Yes; Fluid consistency: Thin  Diet effective now               Precautions / Restrictions Precautions Precautions: Fall Restrictions Weight Bearing Restrictions: No   Has the patient had 2 or more falls or a fall with injury in the past year? No  Prior Activity Level Community (5-7x/wk): Mod I with cane; drove, independent  Prior Functional Level Self Care: Did the patient need help bathing, dressing, using the toilet or eating? Independent  Indoor Mobility: Did the patient need assistance with walking from room to room (with or without device)? Independent  Stairs: Did the patient need  assistance with internal or external stairs (with or without device)? Independent  Functional Cognition: Did the patient need help planning regular tasks such as shopping or remembering to take medications? [place] / Equipment Home Equipment: Cane - single point  Prior Device Use: Indicate devices/aids used by the patient prior to current illness, exacerbation or injury? cane  Current Functional Level Cognition  Arousal/Alertness: Awake/alert Overall Cognitive Status: Impaired/Different from baseline Orientation Level: Oriented X4 Safety/Judgement: Decreased awareness of deficits Attention: Focused, Sustained Focused Attention: Appears intact Sustained Attention: Appears intact Memory: Impaired Memory Impairment: Decreased recall of new information, Decreased short term memory Decreased Short Term Memory: Verbal complex, Functional complex Problem Solving: Impaired Executive Function: Organizing, Self Monitoring Organizing: Impaired Self Monitoring: Impaired    Extremity Assessment (includes Sensation/Coordination)  Upper Extremity Assessment: Defer to OT evaluation LUE Deficits / Details: WFL for AROM but with decreased coordination LUE Sensation: WNL LUE  Coordination: decreased fine motor, decreased gross motor  Lower Extremity Assessment: LLE deficits/detail LLE Deficits / Details: difficulty with motion against gravity; slight knee buckle with full weight bearing  LLE Sensation: WNL LLE Coordination: WNL    ADLs  Overall ADL's : Needs assistance/impaired Eating/Feeding: Modified independent, Sitting Eating/Feeding Details (indicate cue type and reason): increased time Grooming: Wash/dry hands, Minimal assistance, Standing Grooming Details (indicate cue type and reason): at sink; increased time Upper Body Bathing: Minimal assistance, Sitting Upper Body Bathing Details (indicate cue type and reason): increased time Lower Body Bathing: Minimal assistance, Sit to/from stand Lower Body Bathing Details (indicate cue type and reason): increased time Upper Body Dressing : Minimal assistance, Sitting Upper Body Dressing Details (indicate cue type and reason): increased time Lower Body Dressing: Minimal assistance, Sit to/from stand Lower Body Dressing Details (indicate cue type and reason): increased time Toilet Transfer: Minimal assistance, Ambulation, BSC, RW Toilet Transfer Details (indicate cue type and reason): over toilet Toileting- Clothing Manipulation and Hygiene: Minimal assistance, Sit to/from stand    Mobility  Overal bed mobility: Needs Assistance Bed Mobility: Supine to Sit Supine to sit: Supervision General bed mobility comments: pt OOB in recliner chair upon arrival    Transfers  Overall transfer level: Needs assistance Equipment used: None Transfers: Sit to/from Stand Sit to Stand: Min guard General transfer comment: good technique, use of momentum, min guard for safety    Ambulation / Gait / Stairs / Wheelchair Mobility  Ambulation/Gait Ambulation/Gait assistance: Min guard, Min assist Gait Distance (Feet): 100 Feet Assistive device: None, Rolling walker (2 wheeled) Gait Pattern/deviations: Step-to  pattern, Step-through pattern, Decreased step length - right, Decreased stance time - left, Decreased stride length, Decreased weight shift to left General Gait Details: pt with modest instability with fatigue of L LE, noted minor buckling of L LE, improved stability and gait pattern with RW Gait velocity: decreased    Posture / Balance Dynamic Sitting Balance Sitting balance - Comments: donned socks at EOB Balance Overall balance assessment: Needs assistance Sitting-balance support: No upper extremity supported, Feet supported Sitting balance-Leahy Scale: Good Sitting balance - Comments: donned socks at EOB Standing balance support: No upper extremity supported, During functional activity Standing balance-Leahy Scale: Fair Standing balance comment: min guard A    Special needs/care consideration BiPAP/CPAP  N/a CPM  N/a Continuous Drip IV  N/a Dialysis  N/a Life Vest  N/a Oxygen  N/a Special Bed  N/a Trach Size  N/a Wound Vac n/a Skin  intact Bowel mgmt:  Continent LBM 4/24 Bladder mgmt:  continent Diabetic mgmt:  Hgb A1c 9.0 Behavioral consideration  N/a Chemo/radiation  N/a   Previous Home Environment  Living Arrangements: (God daughter, Financial risk analyst, lives with patient)  Lives With: (God daughter lives with patient for past year) Available Help at Discharge: Family, Available 24 hours/day Type of Home: House Home Layout: One level Home Access: Stairs to enter Entrance Stairs-Rails: None Technical brewer of Steps: 3 Bathroom Shower/Tub: Public librarian, Architectural technologist: Programmer, systems: Yes How Accessible: Accessible via walker [place]: No  Discharge Living Setting Plans for Discharge Living Setting: Patient's home(grand daughter lives with pt) Type of Home at Discharge: House Discharge Home Layout: One level Discharge Home Access: Stairs to enter Entrance Stairs-Rails: None Entrance Stairs-Number of Steps: 3 Discharge  Bathroom Shower/Tub: Tub/shower unit Discharge Bathroom Toilet: Standard Discharge Bathroom Accessibility: Yes How Accessible: Accessible via walker Does the patient have any problems obtaining your medications?: No   God daughter, Lunette Stands has lived with pt for one year. Sherice divorced by her husband, Bipolar and had no where to go. APS was contacted by [facility] ER SW, Lamonte Richer on admission to investigate god daughter's care of patient at home when she had functional decline. Patient is aware of APS report.  Social/Family/Support Systems Contact Information: Thad Ranger Anticipated Caregiver: Goddaughter in the home supervision only; Lennette Bihari for errands, Art gallery manager Information: see above Ability/Limitations of Caregiver: Goddaughter lives wiht her for a year; Bipolar "useless' per pt Caregiver Availability: 24/7 Discharge Plan Discussed with Primary Caregiver: Yes Is Caregiver In Agreement with Plan?: Yes Does Caregiver/Family have Issues with Lodging/Transportation while Pt is in Rehab?: No  Goals/Additional Needs Patient/Family Goal for Rehab: Mod I with PT, OT, and SLP Expected length of stay: ELOS 7 to 10 days ; pt wants less Pt/Family Agrees to Admission and willing to participate: Yes Program Orientation Provided & Reviewed with Pt/Caregiver Including Roles  & Responsibilities: Yes  Barriers to Discharge: Decreased caregiver support  Decrease burden of Care through IP rehab admission: n/a  Possible need for SNF placement upon discharge:  Not anticipated  Patient Condition: I have reviewed medical records from [facility] , spoken with CM, and patient. I met with patient at the bedside for inpatient rehabilitation assessment.  Patient will benefit from ongoing PT, OT and SLP, can actively participate in 3 hours of therapy a day 5 days of the week, and can make measurable gains during the admission.  Patient will also benefit from the  coordinated team approach during an Inpatient Acute Rehabilitation admission.  The patient will receive intensive therapy as well as Rehabilitation physician, nursing, social worker, and care management interventions.  Due to safety, disease management, medication administration and patient education the patient requires 24 hour a day rehabilitation nursing.  The patient is currently min assist with mobility and basic ADLs.  Discharge setting and therapy post discharge at home with home health is anticipated.  Patient has agreed to participate in the Acute Inpatient Rehabilitation Program and will admit today.  Preadmission Screen Completed By:  Cleatrice Burke RN MSN 10/25/2018 11:35 AM ______________________________________________________________________   Discussed status with Dr. Posey Pronto  on  10/25/2018 at  1137 and received approval for admission today.  Admission Coordinator:  Cleatrice Burke, RN MSN, time  2202 Date  10/25/2018   Assessment/Plan: Diagnosis: right basal ganglia lacunar infarct  1. Does the need for close, 24 hr/day Medical supervision in concert with the patient's rehab needs make it unreasonable for this patient to be served in  a less intensive setting? Potentially  2. Co-Morbidities requiring supervision/potential complications: HTN (monitor and provide prns in accordance with increased physical exertion and pain), DM (Monitor in accordance with exercise and adjust meds as necessary), hyperlipidemia, marijuana use 3. Due to safety, disease management and patient education, does the patient require 24 hr/day rehab nursing? Potentially 4. Does the patient require coordinated care of a physician, rehab nurse, PT (1-2 hrs/day, 5 days/week) and OT (1-2 hrs/day, 5 days/week) to address physical and functional deficits in the context of the above medical diagnosis(es)? Potentially Addressing deficits in the following areas: balance, endurance, locomotion, strength,  transferring, bathing, dressing, toileting and psychosocial support 5. Can the patient actively participate in an intensive therapy program of at least 3 hrs of therapy 5 days a week? Yes 6. The potential for patient to make measurable gains while on inpatient rehab is good 7. Anticipated functional outcomes upon discharge from inpatients are: modified independent PT, modified independent OT, n/a SLP 8. Estimated rehab length of stay to reach the above functional goals is: 4-7 days. 9. Anticipated D/C setting: Home 10. Anticipated post D/C treatments: HH therapy and Home excercise program 11. Overall Rehab/Functional Prognosis: good  MD Signature: Delice Lesch, MD, ABPMR        Revision History

## 2018-10-25 NOTE — IPOC Note (Signed)
Overall Plan of Care [facility]) Patient Details Name: WILMER FLANNIGAN MRN: [id number] DOB: 1945/10/03  Admitting Diagnosis: Multifocal right MCA infarct.  Hospital Problems: Active Problems:   Right middle cerebral artery stroke (HCC)   Hypokalemia   Class 1 obesity due to excess calories with serious comorbidity and body mass index (BMI) of 32.0 to 32.9 in adult   Poorly controlled type 2 diabetes mellitus with peripheral neuropathy (HCC)   Acute blood loss anemia   Hypertensive crisis     Functional Problem List: Nursing Endurance, Medication Management, Safety, Motor  PT Balance, Endurance, Motor, Safety  OT Balance, Endurance, Motor, Safety  SLP    TR         Basic ADL's: OT Grooming, Bathing, Dressing, Toileting, Eating     Advanced  ADL's: OT       Transfers: PT Bed Mobility, Bed to Chair, Floor, Furniture, Banker, Technical brewer: PT Ambulation, Stairs     Additional Impairments: OT Fuctional Use of Upper Extremity  SLP        TR      Anticipated Outcomes Item Anticipated Outcome  Self Feeding Independent  Swallowing      Basic self-care  Mod I  Toileting  Independent   Bathroom Transfers Mod I  Bowel/Bladder  Patient will continue to be continent of bowel and bladder during admission  Transfers  Mod I  Locomotion  Mod I household mobility w/ LRAD  Communication     Cognition     Pain  Patient will be pain free or pain less than 3 during admission  Safety/Judgment  Patient will be free from falls and adhere to safety plan   Therapy Plan: PT Intensity: Minimum of 1-2 x/day ,45 to 90 minutes PT Frequency: 5 out of 7 days PT Duration Estimated Length of Stay: 5-7 days OT Intensity: Minimum of 1-2 x/day, 45 to 90 minutes OT Frequency: 5 out of 7 days OT Duration/Estimated Length of Stay: 5-7 days     Due to the current state of emergency, patients may not be receiving their 3-hours of Medicare-mandated therapy.   Team  Interventions: Nursing Interventions Patient/Family Education, Psychosocial Support, Disease Management/Prevention, Medication Management  PT interventions Warden/ranger, Ambulation/gait training, Discharge planning, Community reintegration, Disease management/prevention, DME/adaptive equipment instruction, Functional electrical stimulation, Functional mobility training, Neuromuscular re-education, Pain management, Patient/family education, Psychosocial support, Skin care/wound management, Splinting/orthotics, Stair training, Therapeutic Activities, Therapeutic Exercise, UE/LE Strength taining/ROM, UE/LE Coordination activities  OT Interventions Warden/ranger, Community reintegration, Discharge planning, DME/adaptive equipment instruction, Functional mobility training, Neuromuscular re-education, Patient/family education, Psychosocial support, Self Care/advanced ADL retraining, Therapeutic Activities, Therapeutic Exercise, UE/LE Strength taining/ROM, UE/LE Coordination activities  SLP Interventions    TR Interventions    SW/CM Interventions Discharge Planning, Psychosocial Support, Patient/Family Education   Barriers to Discharge MD  Medical stability  Nursing      PT Decreased caregiver support goddaughter unable to provide assist 2/2 psychiatric issues, nephew able to provide PRN help  OT      SLP      SW       Team Discharge Planning: Destination: PT-Home ,OT- Home , SLP-Home Projected Follow-up: PT-Home health PT, OT-  Home health OT, SLP-None Projected Equipment Needs: PT-To be determined, OT- To be determined, SLP-None recommended by SLP Equipment Details: PT-has SPC already, OT-  Patient/family involved in discharge planning: PT- Patient,  OT-Patient, SLP-Patient  MD ELOS: 5-7 days. Medical Rehab Prognosis:  Good Assessment: 73 year old right-handed female  with history of hypertension maintained on HCTZ 25 mg daily, Cozaar 50 mg daily, diabetes  mellitus maintained on Glucophage 1000 mg daily as well as basaglar 35 units daily at bedtime, hyperlipidemia, marijuana use. Patient quit smoking 12 years ago.  Presented 10/22/2018 with headache and left-sided weakness x2 days.  On presentation, blood pressure was noted to systolic 180s-200s.Cranial CT scan showed age indeterminate right basal ganglia and right centrum semiovale ovale hypodensities. MRI brain ordered, personally reviewed, multiple small right brain infarcts.  MRI/MRA brain per report suggesting multifocal areas of acute infarction right MCA territory affecting the right frontal and posterior frontal cortex and subcortical white matter. 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 TEE completed 10/24/2018 showing no thrombus or vegetation ejection fraction 55%. Bubble contrast study negative. Currently maintained on aspirin 81 mg daily and Plavix 75 mg daily 3 weeks than aspirin alone. Subcutaneous Lovenox for DVT prophylaxis.  Hospital course further complicated by significant persistent hypokalemia, supplement initiated and acute on chronic anemia.  Patient with resulting functional deficits with mobility, transfers, self-care, speech, cognition.  We will set goals for Mod I with PT/OT.  See Team Conference Notes for weekly updates to the plan of care

## 2018-10-25 NOTE — PMR Pre-admission (Signed)
PMR Admission Coordinator Pre-Admission Assessment  Patient: Cynthia Hunt is an 73 y.o., female MRN: [id number] DOB: January 03, 1946 Height: 5' 7"  (170.2 cm) Weight: 93 kg  Insurance Information HMO: yes    PPO:      PCP:      IPA:      80/20:      OTHER: medicare advantage PRIMARY: [employer]      Policy#: [id number]      Subscriber: pt CM Name: Cynthia Hunt      Phone#: [phone]     Fax#: [phone] Pre-Cert#: [id number] approved for 7 days      Employer:  Benefits:  Phone #: [phone]     Name: 10/24/2018 Eff. Date: 06/28/2018     Deduct: none      Out of Pocket Max: $5000      Life Max: none CIR: $225 co pay per day days 1 until 6      SNF: no co pay days 1 until 20; $178 co pay per day days 21 until 100 Outpatient: $20 co pay per visit     Co-Pay: visits per medical neccesity; Prior auth for OT only Home Health: 100%      Co-Pay: visits per medical neccesity DME: 80%     Co-Pay: 20% Providers: in network  SECONDARY: none        Medicaid Application Date:       Case Manager:  Disability Application Date:       Case Worker:   The "Data Collection Information Summary" for patients in Inpatient Rehabilitation Facilities with attached "Privacy Act [place] Records" was provided and verbally reviewed with: Patient  Emergency Contact Information Contact Information    Name Relation Home Work Mobile   Cynthia Hunt Friend [phone]  [phone]   Cynthia Hunt   [phone]      Current Medical History  Patient Admitting Diagnosis: right basal ganglia lacunar infarct  History of Present Illness:  Cynthia Hunt is a 73 year old right-handed female with history of hypertension maintained on HCTZ 25 mg daily, Cozaar 50 mg daily, diabetes mellitus maintained on Glucophage 1000 mg daily as well as basilar 35 units daily at bedtime, hyperlipidemia, marijuana use. Patient quit smoking 12 years ago. Presented 10/22/2018 with left side weakness 2 days as  well as headache. Blood pressure noted systolic [id number]. Cranial CT scan showed age indeterminate right basal ganglia and right centrum semi ovale hypo densities.MRI/MRA of head and neck showed multifocal areas of acute infarction right MCA territory affecting the right frontal and posterior frontal cortex and subcortical white matter. 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 50-75% left ICA cavernous supraclinoid stenosis.Patient did not receive TPA.Echocardiogram ejection fraction of 55-60% abnormal aortic valve could not exclude vegetation. TEE completed 10/24/2018 showing no thrombus or vegetation ejection fraction 55%. Bubble contrast study negative.. Currently maintained on aspirin 81 mg daily and Plavix 75 mg daily 3 weeks than aspirin alone. Subcutaneous Lovenox for DVT prophylaxis. Bouts of hypokalemia with supplement added. Acute on chronic anemia hemoglobin 9.2.  Complete NIHSS TOTAL: 2  Patient's medical record from [facility]  has been reviewed by the rehabilitation admission coordinator and physician.  Past Medical History  Past Medical History:  Diagnosis Date  . Depression   . Diabetes mellitus without complication ([place])   . Hyperlipidemia   . Hypertension     Family History   family history includes Stroke in her mother.  Prior Rehab/Hospitalizations Has the patient had prior rehab or  hospitalizations prior to admission? Yes  Has the patient had major surgery during 100 days prior to admission? No   Current Medications  Current Facility-Administered Medications:  .   stroke: mapping our early stages of recovery book, , Does not apply, Once, Skains, Mark C, MD .  0.9 %  sodium chloride infusion, , Intravenous, Continuous, Skains, Thana Farr, MD, Last Rate: 50 mL/hr at 10/22/18 1115 .  acetaminophen (TYLENOL) tablet 650 mg, 650 mg, Oral, Q4H PRN **OR** acetaminophen (TYLENOL) solution 650 mg, 650 mg, Per Tube, Q4H PRN **OR** acetaminophen (TYLENOL)  suppository 650 mg, 650 mg, Rectal, Q4H PRN, Skains, Mark C, MD .  ALPRAZolam Duanne Moron) tablet 0.5 mg, 0.5 mg, Oral, Daily PRN, Jerline Pain, MD .  aspirin EC tablet 81 mg, 81 mg, Oral, Daily, Candee Furbish C, MD, 81 mg at 10/25/18 1019 .  atorvastatin (LIPITOR) tablet 40 mg, 40 mg, Oral, q1800, Jerline Pain, MD, 40 mg at 10/24/18 2128 .  clopidogrel (PLAVIX) tablet 75 mg, 75 mg, Oral, Daily, Jerline Pain, MD, 75 mg at 10/25/18 1019 .  enoxaparin (LOVENOX) injection 40 mg, 40 mg, Subcutaneous, Q24H, Skains, Mark C, MD, 40 mg at 10/23/18 1218 .  hydrALAZINE (APRESOLINE) injection 10 mg, 10 mg, Intravenous, Once, Skains, Mark C, MD .  insulin aspart (novoLOG) injection 0-15 Units, 0-15 Units, Subcutaneous, TID WC, Jerline Pain, MD, 8 Units at 10/23/18 1749 .  insulin aspart (novoLOG) injection 0-5 Units, 0-5 Units, Subcutaneous, QHS, Jerline Pain, MD, 2 Units at 10/22/18 2308 .  insulin glargine (LANTUS) injection 30 Units, 30 Units, Subcutaneous, QHS, Jerline Pain, MD, 30 Units at 10/24/18 2129 .  methocarbamol (ROBAXIN) tablet 500 mg, 500 mg, Oral, Daily, Skains, Mark C, MD, 500 mg at 10/25/18 1019 .  polyethylene glycol (MIRALAX / GLYCOLAX) packet 17 g, 17 g, Oral, Daily, Domenic Polite, MD, 17 g at 10/25/18 1019 .  senna-docusate (Senokot-S) tablet 1 tablet, 1 tablet, Oral, QHS PRN, Jerline Pain, MD .  traZODone (DESYREL) tablet 100 mg, 100 mg, Oral, QHS, Jerline Pain, MD, 100 mg at 10/24/18 2128  Patients Current Diet:  Diet Order            Diet Heart Room service appropriate? Yes; Fluid consistency: Thin  Diet effective now              Precautions / Restrictions Precautions Precautions: Fall Restrictions Weight Bearing Restrictions: No   Has the patient had 2 or more falls or a fall with injury in the past year? No  Prior Activity Level Community (5-7x/wk): Mod I with cane; drove, independent  Prior Functional Level Self Care: Did the patient need help  bathing, dressing, using the toilet or eating? Independent  Indoor Mobility: Did the patient need assistance with walking from room to room (with or without device)? Independent  Stairs: Did the patient need assistance with internal or external stairs (with or without device)? Independent  Functional Cognition: Did the patient need help planning regular tasks such as shopping or remembering to take medications? Independent  Home Assistive Devices / Equipment Home Equipment: Cane - single point  Prior Device Use: Indicate devices/aids used by the patient prior to current illness, exacerbation or injury? cane  Current Functional Level Cognition  Arousal/Alertness: Awake/alert Overall Cognitive Status: Impaired/Different from baseline Orientation Level: Oriented X4 Safety/Judgement: Decreased awareness of deficits Attention: Focused, Sustained Focused Attention: Appears intact Sustained Attention: Appears intact Memory: Impaired Memory Impairment: Decreased recall of new information, Decreased  short term memory Decreased Short Term Memory: Verbal complex, Functional complex Problem Solving: Impaired Executive Function: Organizing, Self Monitoring Organizing: Impaired Self Monitoring: Impaired    Extremity Assessment (includes Sensation/Coordination)  Upper Extremity Assessment: Defer to OT evaluation LUE Deficits / Details: WFL for AROM but with decreased coordination LUE Sensation: WNL LUE Coordination: decreased fine motor, decreased gross motor  Lower Extremity Assessment: LLE deficits/detail LLE Deficits / Details: difficulty with motion against gravity; slight knee buckle with full weight bearing  LLE Sensation: WNL LLE Coordination: WNL    ADLs  Overall ADL's : Needs assistance/impaired Eating/Feeding: Modified independent, Sitting Eating/Feeding Details (indicate cue type and reason): increased time Grooming: Wash/dry hands, Minimal assistance, Standing Grooming  Details (indicate cue type and reason): at sink; increased time Upper Body Bathing: Minimal assistance, Sitting Upper Body Bathing Details (indicate cue type and reason): increased time Lower Body Bathing: Minimal assistance, Sit to/from stand Lower Body Bathing Details (indicate cue type and reason): increased time Upper Body Dressing : Minimal assistance, Sitting Upper Body Dressing Details (indicate cue type and reason): increased time Lower Body Dressing: Minimal assistance, Sit to/from stand Lower Body Dressing Details (indicate cue type and reason): increased time Toilet Transfer: Minimal assistance, Ambulation, BSC, RW Toilet Transfer Details (indicate cue type and reason): over toilet Toileting- Clothing Manipulation and Hygiene: Minimal assistance, Sit to/from stand    Mobility  Overal bed mobility: Needs Assistance Bed Mobility: Supine to Sit Supine to sit: Supervision General bed mobility comments: pt OOB in recliner chair upon arrival    Transfers  Overall transfer level: Needs assistance Equipment used: None Transfers: Sit to/from Stand Sit to Stand: Min guard General transfer comment: good technique, use of momentum, min guard for safety    Ambulation / Gait / Stairs / Wheelchair Mobility  Ambulation/Gait Ambulation/Gait assistance: Min guard, Min assist Gait Distance (Feet): 100 Feet Assistive device: None, Rolling walker (2 wheeled) Gait Pattern/deviations: Step-to pattern, Step-through pattern, Decreased step length - right, Decreased stance time - left, Decreased stride length, Decreased weight shift to left General Gait Details: pt with modest instability with fatigue of L LE, noted minor buckling of L LE, improved stability and gait pattern with RW Gait velocity: decreased    Posture / Balance Dynamic Sitting Balance Sitting balance - Comments: donned socks at EOB Balance Overall balance assessment: Needs assistance Sitting-balance support: No upper  extremity supported, Feet supported Sitting balance-Leahy Scale: Good Sitting balance - Comments: donned socks at EOB Standing balance support: No upper extremity supported, During functional activity Standing balance-Leahy Scale: Fair Standing balance comment: min guard A    Special needs/care consideration BiPAP/CPAP  N/a CPM  N/a Continuous Drip IV  N/a Dialysis  N/a Life Vest  N/a Oxygen  N/a Special Bed  N/a Trach Size  N/a Wound Vac n/a Skin  intact Bowel mgmt:  Continent LBM 4/24 Bladder mgmt:  continent Diabetic mgmt:  Hgb A1c 9.0 Behavioral consideration  N/a Chemo/radiation  N/a   Previous Home Environment  Living Arrangements: (God daughter, Financial risk analyst, lives with patient)  Lives With: (God daughter lives with patient for past year) Available Help at Discharge: Family, Available 24 hours/day Type of Home: House Home Layout: One level Home Access: Stairs to enter Entrance Stairs-Rails: None Technical brewer of Steps: 3 Bathroom Shower/Tub: Public librarian, Architectural technologist: Standard Bathroom Accessibility: Yes How Accessible: Accessible via walker [place]: No  Discharge Living Setting Plans for Discharge Living Setting: Patient's home(grand daughter lives with pt) Type of Home at Discharge:  House Discharge Home Layout: One level Discharge Home Access: Stairs to enter Entrance Stairs-Rails: None Entrance Stairs-Number of Steps: 3 Discharge Bathroom Shower/Tub: Tub/shower unit Discharge Bathroom Toilet: Standard Discharge Bathroom Accessibility: Yes How Accessible: Accessible via walker Does the patient have any problems obtaining your medications?: No   God daughter, Lunette Stands has lived with pt for one year. Sherice divorced by her husband, Bipolar and had no where to go. APS was contacted by [facility] ER SW, Lamonte Richer on admission to investigate god daughter's care of patient at home when she had functional decline. Patient is aware of  APS report.  Social/Family/Support Systems Contact Information: Thad Ranger Anticipated Caregiver: Goddaughter in the home supervision only; Lennette Bihari for errands, Art gallery manager Information: see above Ability/Limitations of Caregiver: Goddaughter lives wiht her for a year; Bipolar "useless' per pt Caregiver Availability: 24/7 Discharge Plan Discussed with Primary Caregiver: Yes Is Caregiver In Agreement with Plan?: Yes Does Caregiver/Family have Issues with Lodging/Transportation while Pt is in Rehab?: No  Goals/Additional Needs Patient/Family Goal for Rehab: Mod I with PT, OT, and SLP Expected length of stay: ELOS 7 to 10 days ; pt wants less Pt/Family Agrees to Admission and willing to participate: Yes Program Orientation Provided & Reviewed with Pt/Caregiver Including Roles  & Responsibilities: Yes  Barriers to Discharge: Decreased caregiver support  Decrease burden of Care through IP rehab admission: n/a  Possible need for SNF placement upon discharge:  Not anticipated  Patient Condition: I have reviewed medical records from [facility] , spoken with CM, and patient. I met with patient at the bedside for inpatient rehabilitation assessment.  Patient will benefit from ongoing PT, OT and SLP, can actively participate in 3 hours of therapy a day 5 days of the week, and can make measurable gains during the admission.  Patient will also benefit from the coordinated team approach during an Inpatient Acute Rehabilitation admission.  The patient will receive intensive therapy as well as Rehabilitation physician, nursing, social worker, and care management interventions.  Due to safety, disease management, medication administration and patient education the patient requires 24 hour a day rehabilitation nursing.  The patient is currently min assist with mobility and basic ADLs.  Discharge setting and therapy post discharge at home with home health is anticipated.  Patient  has agreed to participate in the Acute Inpatient Rehabilitation Program and will admit today.  Preadmission Screen Completed By:  Cleatrice Burke RN MSN 10/25/2018 11:35 AM ______________________________________________________________________   Discussed status with Dr. Posey Pronto  on  10/25/2018 at  1137 and received approval for admission today.  Admission Coordinator:  Cleatrice Burke, RN MSN, time  7342 Date  10/25/2018   Assessment/Plan: Diagnosis: right basal ganglia lacunar infarct  1. Does the need for close, 24 hr/day Medical supervision in concert with the patient's rehab needs make it unreasonable for this patient to be served in a less intensive setting? Potentially  2. Co-Morbidities requiring supervision/potential complications: HTN (monitor and provide prns in accordance with increased physical exertion and pain), DM (Monitor in accordance with exercise and adjust meds as necessary), hyperlipidemia, marijuana use 3. Due to safety, disease management and patient education, does the patient require 24 hr/day rehab nursing? Potentially 4. Does the patient require coordinated care of a physician, rehab nurse, PT (1-2 hrs/day, 5 days/week) and OT (1-2 hrs/day, 5 days/week) to address physical and functional deficits in the context of the above medical diagnosis(es)? Potentially Addressing deficits in the following areas: balance, endurance, locomotion, strength,  transferring, bathing, dressing, toileting and psychosocial support 5. Can the patient actively participate in an intensive therapy program of at least 3 hrs of therapy 5 days a week? Yes 6. The potential for patient to make measurable gains while on inpatient rehab is good 7. Anticipated functional outcomes upon discharge from inpatients are: modified independent PT, modified independent OT, n/a SLP 8. Estimated rehab length of stay to reach the above functional goals is: 4-7 days. 9. Anticipated D/C setting:  Home 10. Anticipated post D/C treatments: HH therapy and Home excercise program 11. Overall Rehab/Functional Prognosis: good  MD Signature: Delice Lesch, MD, ABPMR

## 2018-10-25 NOTE — Progress Notes (Signed)
STROKE TEAM PROGRESS NOTE   INTERVAL HISTORY Patient is doing well.  She has no complaints.  She wants to go home.  TEE done yesterday did not show any valvular vegetations or masses.  Vitals:   10/25/18 0009 10/25/18 0336 10/25/18 0712 10/25/18 1213  BP: (!) 170/61 (!) 179/84 (!) 167/76 (!) 182/84  Pulse: (!) 57 80 76 88  Resp: 18 18 18 18   Temp: 98.6 F (37 C) 98.7 F (37.1 C)  98.5 F (36.9 C)  TempSrc: Oral Oral Oral Oral  SpO2: 100% 99% 99% 99%  Weight:      Height:        CBC:  Recent Labs  Lab 10/22/18 0755 10/25/18 0502  WBC 7.4 5.5  NEUTROABS 6.1  --   HGB 10.5* 9.2*  HCT 35.0* 29.9*  MCV 94.3 91.7  PLT 238 007    Basic Metabolic Panel:  Recent Labs  Lab 10/22/18 0755 10/22/18 0759 10/25/18 0502  NA 140  --  141  K 3.4*  --  2.8*  CL 100  --  104  CO2 22  --  26  GLUCOSE 175*  --  132*  BUN 18  --  16  CREATININE 1.34* 1.10* 1.14*  CALCIUM 8.2*  --  7.4*   Lipid Panel:     Component Value Date/Time   CHOL 136 10/23/2018 0604   TRIG 71 10/23/2018 0604   HDL 41 10/23/2018 0604   CHOLHDL 3.3 10/23/2018 0604   VLDL 14 10/23/2018 0604   LDLCALC 81 10/23/2018 0604   HgbA1c:  Lab Results  Component Value Date   HGBA1C 9.0 (H) 10/23/2018   Alcohol Level     Component Value Date/Time   ETH <10 10/22/2018 0755    IMAGING past 24h No results found.  PHYSICAL EXAM  Pleasant elderly African-American lady currently not in distress. . Afebrile. Head is nontraumatic. Neck is supple without bruit.    Cardiac exam no murmur or gallop. Lungs are clear to auscultation. Distal pulses are well felt. Neurological Exam :  Awake alert oriented x3 with normal speech and language function.  There is no aphasia apraxia or dysarthria.  Extraocular movements are full range without nystagmus.  Visual fields seem full to bedside confrontation testing.  Mild left lower facial weakness.  Tongue midline.  Motor system exam shows mild left hemiparesis with weakness of  left grip and intrinsic hand muscles.  Orbits right over left upper extremity.  Trace weakness of left hip flexors and ankle dorsiflexors only.  Sensation is intact.  Coordination is slow but accurate.  Gait not tested.   ASSESSMENT/PLAN [name] is a 73 y.o. female with history of HTN, HLD, DM, AKI, remote tobacco use, depression presenting with left-sided weakness x2 days and headache in setting of hypertensive urgency.   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  MRI R MCA R frontal and posterior frontal cortex and subcortical infarcts.  MRA head high-grade stenosis R M2 superior division.  L ICA 50 to 75% stenosis.  MRA neck dolichoectatic   Carotid Doppler pending  2D Echo abnormal aortic valve, cannot exclude vegetations.  EF 55 to 60%.  TEE no vegetations  LDL 81  HgbA1c 9.0  Lovenox 40 mg sq daily for VTE prophylaxis  No antithrombotic prior to admission, now on aspirin 81 mg daily  and clopidogrel 75 mg daily. continue x 3 weeks then aspirin alone.   Therapy recommendations: CIR  Disposition: CIR Follow-up Stroke Clinic at [facility] in 4 weeks. Office will call with appointment date and time. Order placed.  Hypertensive urgency  Systolic blood pressure on admission 180-200s   Permissive hypertension (OK if < 220/120) but gradually normalize in 5-7 days  Within higher limits now  Long-term BP goal normotensive  Hyperlipidemia  Home meds: Lipitor 40, resumed in hospital  LDL 81, goal < 70  Continue statin at discharge  Diabetes type II Uncontrolled  HgbA1c 9.0, goal < 7.0  Abnormal aortic valve, ? Abnormal MV   Cardiology consulted  TEE no vegetations  BCx no growth to date   ESR 55  Afebrile   Other Stroke Risk Factors  Advanced age  Former  cigarette smoker, quit 12 years ago advised to stop smoking  History of marijuana use.  UDS not performed this admission  ETOH use, advised to drink no more than 1 drink(s) a day  Obesity, Body mass index is 32.11 kg/m., recommend weight loss, diet and exercise as appropriate   Hospital day # 3  Agree with discharge home.  Follow-up as an outpatient in the stroke clinic in 6 weeks. Antony Contras, MD  To contact Stroke Continuity provider, please refer to [email or web address] After hours, contact General Neurology

## 2018-10-26 ENCOUNTER — Inpatient Hospital Stay (HOSPITAL_COMMUNITY): Payer: HMO | Admitting: Occupational Therapy

## 2018-10-26 ENCOUNTER — Inpatient Hospital Stay (HOSPITAL_COMMUNITY): Payer: HMO | Admitting: Speech Pathology

## 2018-10-26 ENCOUNTER — Inpatient Hospital Stay (HOSPITAL_COMMUNITY): Payer: HMO | Admitting: Physical Therapy

## 2018-10-26 ENCOUNTER — Other Ambulatory Visit: Payer: Self-pay

## 2018-10-26 DIAGNOSIS — D62 Acute posthemorrhagic anemia: Secondary | ICD-10-CM

## 2018-10-26 DIAGNOSIS — I169 Hypertensive crisis, unspecified: Secondary | ICD-10-CM

## 2018-10-26 LAB — COMPREHENSIVE METABOLIC PANEL
ALT: 19 U/L (ref 0–44)
AST: 19 U/L (ref 15–41)
Albumin: 3.1 g/dL — ABNORMAL LOW (ref 3.5–5.0)
Alkaline Phosphatase: 48 U/L (ref 38–126)
Anion gap: 11 (ref 5–15)
BUN: 18 mg/dL (ref 8–23)
CO2: 26 mmol/L (ref 22–32)
Calcium: 7.9 mg/dL — ABNORMAL LOW (ref 8.9–10.3)
Chloride: 104 mmol/L (ref 98–111)
Creatinine, Ser: 1.24 mg/dL — ABNORMAL HIGH (ref 0.44–1.00)
GFR calc Af Amer: 50 mL/min — ABNORMAL LOW (ref >60)
GFR calc non Af Amer: 43 mL/min — ABNORMAL LOW (ref >60)
Glucose, Bld: 107 mg/dL — ABNORMAL HIGH (ref 70–99)
Potassium: 3.5 mmol/L (ref 3.5–5.1)
Sodium: 141 mmol/L (ref 135–145)
Total Bilirubin: 0.5 mg/dL (ref 0.3–1.2)
Total Protein: 7 g/dL (ref 6.5–8.1)

## 2018-10-26 LAB — GLUCOSE, CAPILLARY
Glucose-Capillary: 95 mg/dL (ref 70–99)
Glucose-Capillary: 161 mg/dL — ABNORMAL HIGH (ref 70–99)
Glucose-Capillary: 142 mg/dL — ABNORMAL HIGH (ref 70–99)
Glucose-Capillary: 120 mg/dL — ABNORMAL HIGH (ref 70–99)

## 2018-10-26 LAB — CBC WITH DIFFERENTIAL/PLATELET
Abs Immature Granulocytes: 0.02 10*3/uL (ref 0.00–0.07)
Basophils Absolute: 0 10*3/uL (ref 0.0–0.1)
Basophils Relative: 0 %
Eosinophils Absolute: 0.1 10*3/uL (ref 0.0–0.5)
Eosinophils Relative: 2 %
HCT: 33.5 % — ABNORMAL LOW (ref 36.0–46.0)
Hemoglobin: 10.1 g/dL — ABNORMAL LOW (ref 12.0–15.0)
Immature Granulocytes: 0 %
Lymphocytes Relative: 34 %
Lymphs Abs: 2 10*3/uL (ref 0.7–4.0)
MCH: 27.8 pg (ref 26.0–34.0)
MCHC: 30.1 g/dL (ref 30.0–36.0)
MCV: 92.3 fL (ref 80.0–100.0)
Monocytes Absolute: 0.5 10*3/uL (ref 0.1–1.0)
Monocytes Relative: 8 %
Neutro Abs: 3.4 10*3/uL (ref 1.7–7.7)
Neutrophils Relative %: 56 %
Platelets: 217 10*3/uL (ref 150–400)
RBC: 3.63 MIL/uL — ABNORMAL LOW (ref 3.87–5.11)
RDW: 13.5 % (ref 11.5–15.5)
WBC: 6 10*3/uL (ref 4.0–10.5)
nRBC: 0 % (ref 0.0–0.2)

## 2018-10-26 MED ORDER — LOSARTAN POTASSIUM 25 MG PO TABS
25.0000 mg | ORAL_TABLET | Freq: Every day | ORAL | Status: DC
  Administered 2018-10-26 – 2018-10-27 (×2): 25 mg via ORAL
  Filled 2018-10-26 (×2): qty 1

## 2018-10-26 NOTE — Progress Notes (Signed)
[facility] PHYSICAL MEDICINE & REHABILITATION PROGRESS NOTE  Subjective/Complaints: Patient seen laying in bed this morning.  She states she slept on and off overnight.  She is ready begin therapies today.  ROS: Denies CP, shortness of breath, nausea, vomiting, diarrhea.  Objective: Vital Signs: Blood pressure (!) 195/88, pulse 78, temperature 98.4 F (36.9 C), temperature source Oral, resp. rate 16, height 5\' 7"  (1.702 m), weight 93 kg, SpO2 100 %. No results found. Recent Labs    10/25/18 0502 10/26/18 0653  WBC 5.5 6.0  HGB 9.2* 10.1*  HCT 29.9* 33.5*  PLT 200 217   Recent Labs    10/25/18 0502  NA 141  K 2.8*  CL 104  CO2 26  GLUCOSE 132*  BUN 16  CREATININE 1.14*  CALCIUM 7.4*    Physical Exam: BP (!) 195/88 (BP Location: Right Arm)   Pulse 78   Temp 98.4 F (36.9 C) (Oral)   Resp 16   Ht 5\' 7"  (1.702 m)   Wt 93 kg   SpO2 100%   BMI 32.11 kg/m  Constitutional: No distress . Vital signs reviewed.  Obese. HENT: Normocephalic.  Atraumatic. Eyes: EOMI. No discharge. Cardiovascular: No JVD. Respiratory: Normal effort. GI: Non-distended. Musc: No edema or tenderness in extremities. Neurological: She is alert.  Patient alert and oriented  Fair awareness of deficits.  Makes good eye contact with examiner. Motor: Right upper extremity/right lower extremity: 5/5 proximal distal Left upper extremity: 4-/5 proximal distal Left lower extremity: 4/5 proximal distal Dysarthria Facial weakness  Skin: Skin is warm and dry.  Psychiatric: She has a normal mood and affect. Her behavior is normal.    Assessment/Plan: 1. Functional deficits secondary to multifocal right MCA infarct which require 3+ hours per day of interdisciplinary therapy in a comprehensive inpatient rehab setting.  Physiatrist is providing close team supervision and 24 hour management of active medical problems listed below.  Physiatrist and rehab team continue to assess barriers to  discharge/monitor patient progress toward functional and medical goals  Care Tool:  Bathing              Bathing assist       Upper Body Dressing/Undressing Upper body dressing        Upper body assist      Lower Body Dressing/Undressing Lower body dressing            Lower body assist       Toileting Toileting    Toileting assist Assist for toileting: Moderate Assistance - Patient 50 - 74%     Transfers Chair/bed transfer  Transfers assist           Locomotion Ambulation   Ambulation assist              Walk 10 feet activity   Assist           Walk 50 feet activity   Assist           Walk 150 feet activity   Assist           Walk 10 feet on uneven surface  activity   Assist           Wheelchair     Assist               Wheelchair 50 feet with 2 turns activity    Assist            Wheelchair 150 feet activity     Assist  Medical Problem List and Plan: 1.  Left sided weakness secondary to right MCA cortical and subcortical infarct on 426/2020.             Begin CIR 2.  Antithrombotics: -DVT/anticoagulation:  Lovenox             -antiplatelet therapy: aspirin 81 mg daily and Plavix 75 mg daily 3 weeks then aspirin alone 3. Pain Management: Robaxin 500 mg daily 4. Mood:  Trazodone 100 mg daily at bedtime, Xanax 0.5 mg daily as needed             -antipsychotic agents: N/A 5. Neuropsych: This patient is capable of making decisions on her own behalf. 6. Skin/Wound Care:  Routine skin checks 7. Fluids/Electrolytes/Nutrition:  Routine in and out's   BMP pending for this morning 8. Hypertension. Patient on Cozaar 100 mg daily, HCTZ 25 mg daily prior to admission. Resume as needed.    Cozaar 25 started on 4/30  Hypertensive crisis overnight, will need to monitor increased physical activity today  Monitor with increased mobility 9. Diabetes mellitus peripheral  neuropathy. Hemoglobin A1c 9.0.Lantus insulin 30 units daily. Check blood sugars before meals and at bedtime.    Monitor with increased mobility 10. History of tobacco use 12 years ago as well as marijuana use. Urine drug screen not performed on this latest admission. 11. Obesity. BMI 32.11. Dietary follow-up 12. Hyperlipidemia. Lipitor 13.  Hypokalemia:   Potassium 2.8 on 4/29, labs pending 14.  Acute blood loss anemia  Hemoglobin 10.1 on 4/30  Continue to monitor   LOS: 1 days A FACE TO FACE EVALUATION WAS PERFORMED  Ankit Karis Juba 10/26/2018, 8:46 AM

## 2018-10-26 NOTE — Discharge Summary (Addendum)
Physician Discharge Summary  Cynthia Hunt JXB:[id number] DOB: 11/17/1945 DOA: 10/22/2018  PCP: Darrow Bussing, MD  Admit date: 10/22/2018 Discharge date: 10/25/2018  Time spent: 45 minutes  Recommendations for Outpatient Follow-up:  1. CIR for Rehabilitation 2. PCP Dr.Koirala in 1 week   Discharge Diagnoses:  Principal Problem:   Acute ischemic stroke [facility]) Active Problems:   Uncontrolled type 2 diabetes mellitus without complication, with long-term current use of insulin (HCC)   Essential hypertension   Dyslipidemia   Discharge Condition: stable  Diet recommendation: Diabetic, heart healthy  Filed Weights   10/22/18 0752  Weight: 93 kg    History of present illness:  Cynthia Hunt is a 73 y.o. female with medical history significant of HTN; HLD: DM; and depression presented with stroke-like symptoms. She slipped off the bed and couldn't get off the floor. She noticed her left hand was numb of Friday.  Her arm felt heavy and she was unable to hold anything in it, everything she picked up she dropped.  She also noticed her left leg was acting funny, like she had a flat foot  Hospital Course:   CVA: rightMCAcortical and subcorticalinfarct embolic in setting of abnormal aortic valve, strokesecondary tounknownsource -Patient developed L hemiparesis on Friday concerning for CVA, but refused evaluation until today -CT with R basal ganglia and centrum semiovale hypodensities c/w acute/subacute CVA -MRI/MRA: Multifocal areas of acute infarction -2D echo noted questionable abnormality of the aortic valve, some thickening raising concern for possible vegetation, hence cardiology was consulted, she underwent a TEE today this was negative for vegetation or emboli, no PFO, blood cultures were also done these were negative -Patient was seen by neurology in consultation, recommended aspirin and Plavix daily for 3 weeks followed by aspirin alone -LDL was 81, she was continued on  statin, hemoglobin A1c was 9 -PT OT evaluation completed, CIR was recommended, discharged to inpatient rehab today, left-sided strength continues to improve slowly, hopeful for more improvement in rehab  HTN -Resumed ARB and HCTZ at discharge  HLD -LDL 81-- goal of <70 -Resume statin but will changeZocorto Lipitor 40 mg daily  DM -A1c : 9 -Continue Lantus, resumed metformin at discharge  obesity Body mass index is 32.11 kg/m.  Procedures: TEE done today 4/29 was negative for embolus or vegetations  Discharge Exam: Vitals:   10/25/18 1213 10/25/18 1416  BP: (!) 182/84 (!) 170/69  Pulse: 88 85  Resp: 18 16  Temp: 98.5 F (36.9 C) 98.8 F (37.1 C)  SpO2: 99% 98%    General: AAOx3 Cardiovascular: S1S2/RRR Respiratory: CTAB  Discharge Instructions   Discharge Instructions    Ambulatory referral to Neurology   Complete by:  As directed    Follow up with stroke clinic NP (Jessica Vanschaick or Darrol Angel, if both not available, consider Dr. Delia Heady, Dr. Jamelle Rushing, or Dr. Naomie Dean) at [facility] in about 4 weeks.  For dc to IP rehab today or tomorrow     Allergies as of 10/25/2018      Reactions   Penicillins Other (See Comments)   Childhood Allergy Did it involve swelling of the face/tongue/throat, SOB, or low BP? Unknown Did it involve sudden or severe rash/hives, skin peeling, or any reaction on the inside of your mouth or nose? Unknown Did you need to seek medical attention at a hospital or doctor's office? Unknown When did it last happen?childhood If all above answers are "NO", may proceed with cephalosporin use.  Medication List    STOP taking these medications   meloxicam 7.5 MG tablet Commonly known as:  MOBIC   simvastatin 40 MG tablet Commonly known as:  ZOCOR     TAKE these medications   acetaminophen 500 MG tablet Commonly known as:  TYLENOL Take 1,000 mg by mouth daily.   Alcohol Prep 70  % Pads   ALPRAZolam 0.5 MG tablet Commonly known as:  XANAX Take 0.5 mg by mouth daily as needed (anxiety while driving).   aspirin 81 MG EC tablet Take 1 tablet (81 mg total) by mouth daily.   atorvastatin 40 MG tablet Commonly known as:  LIPITOR Take 1 tablet (40 mg total) by mouth daily at 6 PM.   Basaglar KwikPen 100 UNIT/ML Sopn Inject 35 Units into the skin at bedtime.   clopidogrel 75 MG tablet Commonly known as:  PLAVIX Take 1 tablet (75 mg total) by mouth daily for 21 days.   diphenhydrAMINE 25 MG tablet Commonly known as:  BENADRYL Take 25 mg by mouth 3 (three) times daily.   fluticasone 50 MCG/ACT nasal spray Commonly known as:  FLONASE Place 1 spray into both nostrils daily as needed for allergies or rhinitis.   FreeStyle Control Solution Liqd   freestyle lancets   FREESTYLE LITE test strip Generic drug:  glucose blood   HAIR SKIN & NAILS GUMMIES PO Take 1 tablet by mouth daily.   losartan 100 MG tablet Commonly known as:  COZAAR Take 100 mg by mouth daily.   metFORMIN 1000 MG tablet Commonly known as:  GLUCOPHAGE Take 1,000 mg by mouth 2 (two) times daily with a meal.   methocarbamol 500 MG tablet Commonly known as:  ROBAXIN Take 500 mg by mouth 2 (two) times daily as needed for muscle spasms. Script states "not meant for daily use"   multivitamin with minerals Tabs tablet Take 1 tablet by mouth daily.   OVER THE COUNTER MEDICATION Apply 1 application topically daily. Over the counter pain cream "Hemma" sp?   polyethylene glycol 17 g packet Commonly known as:  MIRALAX / GLYCOLAX Take 17 g by mouth daily as needed.   traZODone 100 MG tablet Commonly known as:  DESYREL Take 100 mg by mouth at bedtime.      Allergies  Allergen Reactions  . Penicillins Other (See Comments)    Childhood Allergy Did it involve swelling of the face/tongue/throat, SOB, or low BP? Unknown Did it involve sudden or severe rash/hives, skin peeling, or any  reaction on the inside of your mouth or nose? Unknown Did you need to seek medical attention at a hospital or doctor's office? Unknown When did it last happen?childhood If all above answers are "NO", may proceed with cephalosporin use.    Follow-up Information    [facility] Follow up in 4 week(s).   Specialty:  Neurology Why:  stroke clinic. office will call with appt date and time Contact information: [address] [place] [address] [phone]           The results of significant diagnostics from this hospitalization (including imaging, microbiology, ancillary and laboratory) are listed below for reference.    Significant Diagnostic Studies: Ct Head Wo Contrast  Result Date: 10/22/2018 CLINICAL DATA:  Increasing LEFT-sided weakness. Symptoms began 2 days ago. EXAM: CT HEAD WITHOUT CONTRAST TECHNIQUE: Contiguous axial images were obtained from the base of the skull through the vertex without intravenous contrast. COMPARISON:  No priors for comparison. FINDINGS: Brain: Age-indeterminate  hypodensity of RIGHT basal ganglia, between the caudate and lentiform nucleus, could represent developing acute infarction. Similarly, asymmetric hypoattenuation of the RIGHT centrum semiovale, could be acute or chronic. No definite acute cortical hypodensities. No hemorrhage, mass lesion, hydrocephalus, or extra-axial fluid. Mild cerebral and cerebellar atrophy. Generalized white matter hypoattenuation could represent small vessel disease. Vascular: Calcification of the cavernous internal carotid arteries consistent with cerebrovascular atherosclerotic disease. No signs of intracranial large vessel occlusion. Skull: Intact. Sinuses/Orbits: No acute sinus findings. Conjugate gaze to the RIGHT. Other: None. IMPRESSION: 1. Age-indeterminate RIGHT basal ganglia and RIGHT centrum semiovale hypodensities could represent acute or chronic infarction. 2. If no  contraindications, MRI of the brain without contrast recommended for further evaluation. Electronically Signed   By: Elsie Stain M.D.   On: 10/22/2018 08:28   Mr [name] Head Wo Contrast  Result Date: 10/22/2018 CLINICAL DATA:  Stroke-like symptoms. Slipped off bed, and could not get off floor. Reportedly, LEFT hemiparesis for 2 days, but patient did not want to come to hospital. Stroke risk factors include uncontrolled type 2 diabetes mellitus, essential hypertension and dyslipidemia. EXAM: MR HEAD WITHOUT CONTRAST MR CIRCLE OF WILLIS WITHOUT CONTRAST MRA OF THE NECK WITHOUT AND WITH CONTRAST TECHNIQUE: Multiplanar, multiecho pulse sequences of the brain, circle of Willis and surrounding structures were obtained without intravenous contrast. Angiographic images of the neck were obtained using MRA technique without and with intravenous contrast. CONTRAST:  Gadavist 10 mL. COMPARISON:  Noncontrast CT head earlier today. FINDINGS: MR HEAD FINDINGS Brain: Multifocal areas of restricted diffusion, corresponding low ADC, affect the RIGHT frontal cortex, posterior frontal cortex, and subcortical white matter consistent with acute nonhemorrhagic infarcts. One area of acute infarction, roughly 8 mm in diameter, corresponds with the abnormality noted on CT earlier today. The second area questioned on earlier CT, in the RIGHT basal ganglia, represents an area of chronic infarction. No mass lesion, hydrocephalus, or extra-axial fluid. Generalized atrophy. Moderately advanced T2 and FLAIR hyperintensities throughout the white matter, likely small vessel disease. Vascular: Reported separately. Skull and upper cervical spine: No acute findings. Partial empty sella. Sinuses/Orbits: Mucosal thickening.  Negative orbits. Other: None. MR CIRCLE OF WILLIS FINDINGS RIGHT ICA: Nonstenotic irregularity in its cavernous segment. Otherwise widely patent. M1 MCA widely patent. A1 ACA widely patent. High-grade 75% or greater stenosis of  the superior division M2 MCA on the RIGHT. See series 5 image 122. This is likely contributory to the observed pattern of infarction. LEFT ICA: 50% cavernous stenosis. 50-75% stenosis at the junction of the cavernous and supraclinoid ICA. LEFT M1 MCA widely patent. 50% stenosis origin LEFT A1 ACA. Nonstenotic irregularity LEFT M2 vessels. Distal anterior cerebral arteries are mildly irregular but patent. Basilar artery mildly irregular but patent. RIGHT vertebral dominant contributor. LEFT vertebral diminutive but patent. Fetal origin LEFT PCA. RIGHT PCA widely patent. No cerebellar branch occlusion. No saccular aneurysm. MRA NECK FINDINGS Dolichoectatic great vessels, but no proximal stenosis. Nonstenotic irregularity RIGHT carotid bifurcation. Nonstenotic irregularity LEFT carotid bifurcation. Both vertebral arteries are patent without significant ostial stenosis, RIGHT dominant. IMPRESSION: Multifocal areas of acute infarction, RIGHT MCA territory, affect the RIGHT frontal and posterior frontal cortex and subcortical white matter. High-grade stenosis of the RIGHT M2 MCA superior division is likely contributory to the observed pattern of ischemia. 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 No intracranial hemorrhage or mass. 50-75% LEFT ICA cavernous/supraclinoid stenosis; otherwise, no proximal flow-limiting stenosis of the carotid or basilar arteries. Extracranial vasculature is dolichoectatic without  flow-limiting stenosis, dissection, or occlusion. Minor atheromatous change both carotid bifurcations. Electronically Signed   By: Elsie Stain M.D.   On: 10/22/2018 14:22   Mr Angiogram Neck W Or Wo Contrast  Result Date: 10/22/2018 CLINICAL DATA:  Stroke-like symptoms. Slipped off bed, and could not get off floor. Reportedly, LEFT hemiparesis for 2 days, but patient did not want to come to hospital. Stroke risk factors include uncontrolled type 2 diabetes  mellitus, essential hypertension and dyslipidemia. EXAM: MR HEAD WITHOUT CONTRAST MR CIRCLE OF WILLIS WITHOUT CONTRAST MRA OF THE NECK WITHOUT AND WITH CONTRAST TECHNIQUE: Multiplanar, multiecho pulse sequences of the brain, circle of Willis and surrounding structures were obtained without intravenous contrast. Angiographic images of the neck were obtained using MRA technique without and with intravenous contrast. CONTRAST:  Gadavist 10 mL. COMPARISON:  Noncontrast CT head earlier today. FINDINGS: MR HEAD FINDINGS Brain: Multifocal areas of restricted diffusion, corresponding low ADC, affect the RIGHT frontal cortex, posterior frontal cortex, and subcortical white matter consistent with acute nonhemorrhagic infarcts. One area of acute infarction, roughly 8 mm in diameter, corresponds with the abnormality noted on CT earlier today. The second area questioned on earlier CT, in the RIGHT basal ganglia, represents an area of chronic infarction. No mass lesion, hydrocephalus, or extra-axial fluid. Generalized atrophy. Moderately advanced T2 and FLAIR hyperintensities throughout the white matter, likely small vessel disease. Vascular: Reported separately. Skull and upper cervical spine: No acute findings. Partial empty sella. Sinuses/Orbits: Mucosal thickening.  Negative orbits. Other: None. MR CIRCLE OF WILLIS FINDINGS RIGHT ICA: Nonstenotic irregularity in its cavernous segment. Otherwise widely patent. M1 MCA widely patent. A1 ACA widely patent. High-grade 75% or greater stenosis of the superior division M2 MCA on the RIGHT. See series 5 image 122. This is likely contributory to the observed pattern of infarction. LEFT ICA: 50% cavernous stenosis. 50-75% stenosis at the junction of the cavernous and supraclinoid ICA. LEFT M1 MCA widely patent. 50% stenosis origin LEFT A1 ACA. Nonstenotic irregularity LEFT M2 vessels. Distal anterior cerebral arteries are mildly irregular but patent. Basilar artery mildly irregular but  patent. RIGHT vertebral dominant contributor. LEFT vertebral diminutive but patent. Fetal origin LEFT PCA. RIGHT PCA widely patent. No cerebellar branch occlusion. No saccular aneurysm. MRA NECK FINDINGS Dolichoectatic great vessels, but no proximal stenosis. Nonstenotic irregularity RIGHT carotid bifurcation. Nonstenotic irregularity LEFT carotid bifurcation. Both vertebral arteries are patent without significant ostial stenosis, RIGHT dominant. IMPRESSION: Multifocal areas of acute infarction, RIGHT MCA territory, affect the RIGHT frontal and posterior frontal cortex and subcortical white matter. High-grade stenosis of the RIGHT M2 MCA superior division is likely contributory to the observed pattern of ischemia. 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 No intracranial hemorrhage or mass. 50-75% LEFT ICA cavernous/supraclinoid stenosis; otherwise, no proximal flow-limiting stenosis of the carotid or basilar arteries. Extracranial vasculature is dolichoectatic without flow-limiting stenosis, dissection, or occlusion. Minor atheromatous change both carotid bifurcations. Electronically Signed   By: Elsie Stain M.D.   On: 10/22/2018 14:22   Mr Brain Wo Contrast  Result Date: 10/22/2018 CLINICAL DATA:  Stroke-like symptoms. Slipped off bed, and could not get off floor. Reportedly, LEFT hemiparesis for 2 days, but patient did not want to come to hospital. Stroke risk factors include uncontrolled type 2 diabetes mellitus, essential hypertension and dyslipidemia. EXAM: MR HEAD WITHOUT CONTRAST MR CIRCLE OF WILLIS WITHOUT CONTRAST MRA OF THE NECK WITHOUT AND WITH CONTRAST TECHNIQUE: Multiplanar, multiecho pulse sequences of the brain, circle of Willis and  surrounding structures were obtained without intravenous contrast. Angiographic images of the neck were obtained using MRA technique without and with intravenous contrast. CONTRAST:  Gadavist 10 mL. COMPARISON:   Noncontrast CT head earlier today. FINDINGS: MR HEAD FINDINGS Brain: Multifocal areas of restricted diffusion, corresponding low ADC, affect the RIGHT frontal cortex, posterior frontal cortex, and subcortical white matter consistent with acute nonhemorrhagic infarcts. One area of acute infarction, roughly 8 mm in diameter, corresponds with the abnormality noted on CT earlier today. The second area questioned on earlier CT, in the RIGHT basal ganglia, represents an area of chronic infarction. No mass lesion, hydrocephalus, or extra-axial fluid. Generalized atrophy. Moderately advanced T2 and FLAIR hyperintensities throughout the white matter, likely small vessel disease. Vascular: Reported separately. Skull and upper cervical spine: No acute findings. Partial empty sella. Sinuses/Orbits: Mucosal thickening.  Negative orbits. Other: None. MR CIRCLE OF WILLIS FINDINGS RIGHT ICA: Nonstenotic irregularity in its cavernous segment. Otherwise widely patent. M1 MCA widely patent. A1 ACA widely patent. High-grade 75% or greater stenosis of the superior division M2 MCA on the RIGHT. See series 5 image 122. This is likely contributory to the observed pattern of infarction. LEFT ICA: 50% cavernous stenosis. 50-75% stenosis at the junction of the cavernous and supraclinoid ICA. LEFT M1 MCA widely patent. 50% stenosis origin LEFT A1 ACA. Nonstenotic irregularity LEFT M2 vessels. Distal anterior cerebral arteries are mildly irregular but patent. Basilar artery mildly irregular but patent. RIGHT vertebral dominant contributor. LEFT vertebral diminutive but patent. Fetal origin LEFT PCA. RIGHT PCA widely patent. No cerebellar branch occlusion. No saccular aneurysm. MRA NECK FINDINGS Dolichoectatic great vessels, but no proximal stenosis. Nonstenotic irregularity RIGHT carotid bifurcation. Nonstenotic irregularity LEFT carotid bifurcation. Both vertebral arteries are patent without significant ostial stenosis, RIGHT dominant.  IMPRESSION: Multifocal areas of acute infarction, RIGHT MCA territory, affect the RIGHT frontal and posterior frontal cortex and subcortical white matter. High-grade stenosis of the RIGHT M2 MCA superior division is likely contributory to the observed pattern of ischemia. 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 No intracranial hemorrhage or mass. 50-75% LEFT ICA cavernous/supraclinoid stenosis; otherwise, no proximal flow-limiting stenosis of the carotid or basilar arteries. Extracranial vasculature is dolichoectatic without flow-limiting stenosis, dissection, or occlusion. Minor atheromatous change both carotid bifurcations. Electronically Signed   By: Elsie Stain M.D.   On: 10/22/2018 14:22   Vas US Carotid (at [facility])  Result Date: 10/23/2018 Carotid Arterial Duplex Study Indications: CVA. Performing Technologist: Blanch Media RVS  Examination Guidelines: A complete evaluation includes B-mode imaging, spectral Doppler, color Doppler, and power Doppler as needed of all accessible portions of each vessel. Bilateral testing is considered an integral part of a complete examination. Limited examinations for reoccurring indications may be performed as noted.  Right Carotid Findings: +----------+--------+--------+--------+------------+--------+           PSV cm/sEDV cm/sStenosisDescribe    Comments +----------+--------+--------+--------+------------+--------+ CCA Prox  45                      heterogenous         +----------+--------+--------+--------+------------+--------+ CCA Distal61      14              heterogenous         +----------+--------+--------+--------+------------+--------+ ICA Prox  75      25      1-39%   heterogenous         +----------+--------+--------+--------+------------+--------+ ICA Distal59  19                                   +----------+--------+--------+--------+------------+--------+ ECA        113     6                                    +----------+--------+--------+--------+------------+--------+ +----------+--------+-------+--------+-------------------+           PSV cm/sEDV cmsDescribeArm Pressure (mmHG) +----------+--------+-------+--------+-------------------+ [id number]                                         +----------+--------+-------+--------+-------------------+ +---------+--------+--+--------+--+---------+ VertebralPSV cm/s53EDV cm/s16Antegrade +---------+--------+--+--------+--+---------+  Left Carotid Findings: +----------+--------+--------+--------+------------+--------+           PSV cm/sEDV cm/sStenosisDescribe    Comments +----------+--------+--------+--------+------------+--------+ CCA Prox  111     17              heterogenous         +----------+--------+--------+--------+------------+--------+ CCA Distal66      14              heterogenous         +----------+--------+--------+--------+------------+--------+ ICA Prox  86      27      1-39%   heterogenous         +----------+--------+--------+--------+------------+--------+ ICA Distal56      18                                   +----------+--------+--------+--------+------------+--------+ ECA       85      7                                    +----------+--------+--------+--------+------------+--------+ +----------+--------+--------+--------+-------------------+ SubclavianPSV cm/sEDV cm/sDescribeArm Pressure (mmHG) +----------+--------+--------+--------+-------------------+           93                                          +----------+--------+--------+--------+-------------------+ +---------+--------+--+--------+--+---------+ VertebralPSV cm/s32EDV cm/s10Antegrade +---------+--------+--+--------+--+---------+  Summary:   *See table(s) above for measurements and observations.  Electronically signed by Delia Heady MD on 10/23/2018 at  1:47:46 PM.    Final     Microbiology: Recent Results (from the past 240 hour(s))  Culture, blood (Routine X 2) w Reflex to ID Panel     Status: None (Preliminary result)   Collection Time: 10/23/18  3:15 PM  Result Value Ref Range Status   Specimen Description BLOOD LEFT ANTECUBITAL  Final   Special Requests   Final    BOTTLES DRAWN AEROBIC ONLY Blood Culture adequate volume   Culture   Final    NO GROWTH 3 DAYS Performed at [facility], [address], [place], [place] [address]    Report Status PENDING  Incomplete  Culture, blood (Routine X 2) w Reflex to ID Panel     Status: None (Preliminary result)   Collection Time: 10/23/18  3:35 PM  Result Value Ref Range Status   Specimen Description BLOOD LEFT ARM  Final  Special Requests   Final    BOTTLES DRAWN AEROBIC ONLY Blood Culture adequate volume   Culture   Final    NO GROWTH 3 DAYS Performed at [facility], [address] [place], [place] [id number]    Report Status PENDING  Incomplete     Labs: Basic Metabolic Panel: Recent Labs  Lab 10/22/18 0755 10/22/18 0759 10/25/18 0502 10/26/18 0653  NA 140  --  141 141  K 3.4*  --  2.8* 3.5  CL 100  --  104 104  CO2 22  --  26 26  GLUCOSE 175*  --  132* 107*  BUN 18  --  16 18  CREATININE 1.34* 1.10* 1.14* 1.24*  CALCIUM 8.2*  --  7.4* 7.9*   Liver Function Tests: Recent Labs  Lab 10/22/18 0755 10/26/18 0653  AST 22 19  ALT 20 19  ALKPHOS 49 48  BILITOT 0.8 0.5  PROT 7.7 7.0  ALBUMIN 3.5 3.1*   No results for input(s): LIPASE, AMYLASE in the last 168 hours. No results for input(s): AMMONIA in the last 168 hours. CBC: Recent Labs  Lab 10/22/18 0755 10/25/18 0502 10/26/18 0653  WBC 7.4 5.5 6.0  NEUTROABS 6.1  --  3.4  HGB 10.5* 9.2* 10.1*  HCT 35.0* 29.9* 33.5*  MCV 94.3 91.7 92.3  PLT 238 200 217   Cardiac Enzymes: Recent Labs  Lab 10/22/18 0755  CKTOTAL 69   BNP: BNP (last 3 results) No results for input(s): BNP in the last  8760 hours.  ProBNP (last 3 results) No results for input(s): PROBNP in the last 8760 hours.  CBG: Recent Labs  Lab 10/25/18 1315 10/25/18 1706 10/25/18 2112 10/26/18 0634 10/26/18 1102  GLUCAP 180* 191* 175* 95 161*       Signed:  Zannie CovePreetha Undrea Archbold MD.  [facility] 10/26/2018, 4:19 PM

## 2018-10-26 NOTE — Evaluation (Signed)
Physical Therapy Assessment and Plan  Patient Details  Name: MARSHA GUNDLACH MRN: [id number] Date of Birth: 1945-12-23  PT Diagnosis: Abnormal posture, Abnormality of gait, Difficulty walking, Hemiplegia non-dominant and Muscle weakness Rehab Potential: Excellent ELOS: 5-7 days   Today's Date: 10/26/2018 PT Individual Time: 1115-1225 PT Individual Time Calculation (min): 70 min    Problem List:  Patient Active Problem List   Diagnosis Date Noted  . Acute blood loss anemia   . Hypertensive crisis   . 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 Hypokalemia   . Class 1 obesity due to excess calories with serious comorbidity and body mass index (BMI) of 32.0 to 32.9 in adult   . Poorly controlled type 2 diabetes mellitus with peripheral neuropathy ([place])   . Acute ischemic stroke ([place]) 10/22/2018  . Essential hypertension 10/22/2018  . Dyslipidemia 10/22/2018  . Uncontrolled type 2 diabetes mellitus without complication, with long-term current use of insulin ([place]) 02/13/2016    Past Medical History:  Past Medical History:  Diagnosis Date  . Depression   . Diabetes mellitus without complication ([place])   . Hyperlipidemia   . Hypertension    Past Surgical History:  Past Surgical History:  Procedure Laterality Date  . BUBBLE STUDY  10/24/2018   Procedure: BUBBLE STUDY;  Surgeon: Jerline Pain, MD;  Location: [facility] ENDOSCOPY;  Service: Cardiovascular;;  . TEE WITHOUT CARDIOVERSION N/A 10/24/2018   Procedure: TRANSESOPHAGEAL ECHOCARDIOGRAM (TEE);  Surgeon: Jerline Pain, MD;  Location: [facility] ENDOSCOPY;  Service: Cardiovascular;  Laterality: N/A;  . UTERINE FIBROID SURGERY      Assessment & Plan Clinical Impression: Patient is a 73 year old right-handed female with history of hypertension maintained on HCTZ 25 mg daily, Cozaar 50 mg daily, diabetes mellitus maintained on Glucophage 1000 mg daily as well as basaglar 35 units daily at bedtime, hyperlipidemia, marijuana use.  Patient quit smoking 12 years ago.  Per chart review and patient, patient lives with her 73 year old Film/video editor. Independent driving prior to admission. Goddaughter assist as needed. Presented 10/22/2018 with headache and left-sided weakness x2 days.  On presentation, blood pressure was noted to systolic [id number].Cranial CT scan showed age indeterminate right basal ganglia and right centrum semiovale ovale hypodensities. MRI brain ordered, personally reviewed, multiple small right brain infarcts.  MRI/MRA brain per report suggesting multifocal areas of acute infarction right MCA territory affecting the right frontal and posterior frontal cortex and subcortical white matter. 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 TEE completed 10/24/2018 showing no thrombus or vegetation ejection fraction 55%. Bubble contrast study negative.. Currently maintained on aspirin 81 mg daily and Plavix 75 mg daily 3 weeks than aspirin alone. Subcutaneous Lovenox for DVT prophylaxis.  Hospital course further complicated by significant persistent hypokalemia, supplement initiated.  Acute on chronic anemia hemoglobin 9.2. Therapy evaluations completed with recommendations of physical medicine rehabilitation consult. Patient transferred to CIR on 10/25/2018 .   Patient currently requires min with mobility secondary to muscle weakness, decreased cardiorespiratoy endurance, impaired timing and sequencing and unbalanced muscle activation and decreased standing balance, decreased postural control, hemiplegia and decreased balance strategies.  Prior to hospitalization, patient was independent  with mobility and lived with Family(goddaughter lives with her, however is not able to help) in a House home.  Home access is 1 threshold Stairs to  enter.  Patient will benefit from skilled PT  intervention to maximize safe functional mobility, minimize fall risk and decrease caregiver burden for planned discharge home with intermittent assist.  Anticipate patient will benefit from follow up [facility] at discharge.  PT - End of Session Activity Tolerance: Tolerates < 10 min activity, no significant change in vital signs Endurance Deficit: Yes Endurance Deficit Description: decreased, increased work of breathing w/ all mobility  PT Assessment Rehab Potential (ACUTE/IP ONLY): Excellent PT Barriers to Discharge: Decreased caregiver support PT Barriers to Discharge Comments: goddaughter unable to provide assist 2/2 psychiatric issues, nephew able to provide PRN help PT Patient demonstrates impairments in the following area(s): Balance;Endurance;Motor;Safety PT Transfers Functional Problem(s): Bed Mobility;Bed to Chair;Floor;Furniture;Car PT Locomotion Functional Problem(s): Ambulation;Stairs PT Plan PT Intensity: Minimum of 1-2 x/day ,45 to 90 minutes PT Frequency: 5 out of 7 days PT Duration Estimated Length of Stay: 5-7 days PT Treatment/Interventions: Training and development officer;Ambulation/gait training;Discharge planning;Community reintegration;Disease management/prevention;DME/adaptive equipment instruction;Functional electrical stimulation;Functional mobility training;Neuromuscular re-education;Pain management;Patient/family education;Psychosocial support;Skin care/wound management;Splinting/orthotics;Stair training;Therapeutic Activities;Therapeutic Exercise;UE/LE Strength taining/ROM;UE/LE Coordination activities PT Transfers Anticipated Outcome(s): Mod I PT Locomotion Anticipated Outcome(s): Mod I household mobility w/ LRAD PT Recommendation Follow Up Recommendations: Home health PT Patient destination: Home Equipment Recommended: To be determined Equipment Details: has SPC already  Skilled Therapeutic Intervention  Pt in supine and  agreeable to therapy, no c/o pain. Performed functional mobility as detailed below including bed mobility, transfers, gait, and stair negotiation. Performed gait w/o AD w/ min assist, and CGA w/ AD. Trial-ed both use of RW and SPC, both w/ CGA. Will continue to work towards [employer]. Instructed pt on exercises she can perform outside of therapy to work on LLE strengthening including heel raises, toe raises, LAQs, heel slides, and gastroc stretching; all in seated. Educated on stroke recovery and stroke risk, provided w/ information booklet and reader's glasses. Instructed pt in results of PT evaluation as detailed below, PT POC, rehab potential, rehab goals, and discharge recommendations. Additionally discussed CIR's policies regarding fall safety and use of chair alarm and/or quick release belt. Pt verbalized understanding and in agreement. Ended session in recliner, all needs in reach.  PT Evaluation Precautions/Restrictions Precautions Precautions: Fall Restrictions Weight Bearing Restrictions: No Pain Pain Assessment Pain Scale: 0-10 Pain Score: 0-No pain Home Living/Prior Functioning Home Living Available Help at Discharge: Family(nephew able to provide PRN assist for errands) Type of Home: House Home Access: Stairs to enter [employer] of Steps: 1 threshold  Entrance Stairs-Rails: None Home Layout: One level Bathroom Shower/Tub: Product/process development scientist: Standard Bathroom Accessibility: Yes  Lives With: Family(goddaughter lives with her, however is not able to help) Prior Function Level of Independence: Independent with gait;Independent with basic ADLs;Independent with homemaking with ambulation;Independent with transfers  Able to Take Stairs?: Yes Driving: Yes Vocation: Retired Art gallery manager: Within [place]: Intact  Cognition Overall Cognitive Status: Within Functional Limits for tasks  assessed Arousal/Alertness: Awake/alert Orientation Level: Oriented X4 Focused Attention: Appears intact Sustained Attention: Appears intact Memory: Impaired(very mild impairments) Memory Impairment: Decreased recall of new information;Decreased short term memory Awareness: Appears intact Problem Solving: Appears intact Organizing: Appears intact Self Monitoring: Appears intact Safety/Judgment: Appears intact Sensation Sensation Light Touch: Appears Intact Coordination Gross Motor Movements are Fluid and Coordinated: No Heel Shin Test: Mild impairment L 2/2 weakness Motor  Motor Motor: Hemiplegia Motor - Skilled Clinical Observations: Mild L hemi  Mobility Bed Mobility Bed Mobility: Rolling Right;Rolling Left;Sit to Supine;Supine to Sit Rolling Right: Supervision/verbal cueing Rolling Left: Supervision/Verbal cueing Supine to Sit: Minimal  Assistance - Patient > 75% Sit to Supine: Minimal Assistance - Patient > 75% Transfers Transfers: Sit to Stand;Stand to Sit;Stand Pivot Transfers Sit to Stand: Contact Guard/Touching assist Stand to Sit: Contact Guard/Touching assist Stand Pivot Transfers: Contact Guard/Touching assist Transfer (Assistive device): None Locomotion  Gait Ambulation: Yes Gait Assistance: Contact Guard/Touching assist Gait Distance (Feet): 150 Feet Assistive device: Straight cane Gait Gait: Yes Gait Pattern: Wide base of support;Poor foot clearance - left Gait velocity: decreased Stairs / Additional Locomotion Stairs: Yes Stairs Assistance: Contact Guard/Touching assist Stair Management Technique: Two rails Number of Stairs: 4 Height of Stairs: 6 Wheelchair Mobility Wheelchair Mobility: No  Trunk/Postural Assessment  Cervical Assessment Cervical Assessment: Exceptions to WFL(pt severely restricted in all planes of motion, per pt's this is baseline, requires entire trunk movements to compensate) Thoracic Assessment Thoracic Assessment: Exceptions  to WFL(kyphotic) Lumbar Assessment Lumbar Assessment: Within Functional Limits Postural Control Postural Control: Within Functional Limits  Balance Balance Balance Assessed: Yes Standardized Balance Assessment Standardized Balance Assessment: Berg Balance Test Berg Balance Test Sit to Stand: Able to stand without using hands and stabilize independently Standing Unsupported: Able to stand 2 minutes with supervision Sitting with Back Unsupported but Feet Supported on Floor or Stool: Able to sit safely and securely 2 minutes Stand to Sit: Sits safely with minimal use of hands Transfers: Able to transfer safely, definite need of hands Standing Unsupported with Eyes Closed: Able to stand 10 seconds with supervision Standing Ubsupported with Feet Together: Needs help to attain position and unable to hold for 15 seconds From Standing, Reach Forward with Outstretched Arm: Can reach forward >12 cm safely (5") From Standing Position, Pick up Object from Floor: Able to pick up shoe, needs supervision From Standing Position, Turn to Look Behind Over each Shoulder: Needs supervision when turning Turn 360 Degrees: Needs close supervision or verbal cueing Standing Unsupported, Alternately Place Feet on Step/Stool: Able to complete >2 steps/needs minimal assist Standing Unsupported, One Foot in Front: Able to take small step independently and hold 30 seconds Standing on One Leg: Tries to lift leg/unable to hold 3 seconds but remains standing independently Total Score: 33 Static Sitting Balance Static Sitting - Balance Support: No upper extremity supported;Feet supported Static Sitting - Level of Assistance: 5: Stand by assistance Dynamic Sitting Balance Dynamic Sitting - Balance Support: No upper extremity supported;During functional activity;Feet supported Dynamic Sitting - Level of Assistance: 5: Stand by assistance Static Standing Balance Static Standing - Balance Support: During functional  activity;No upper extremity supported Static Standing - Level of Assistance: 5: Stand by assistance(CGA) Dynamic Standing Balance Dynamic Standing - Balance Support: No upper extremity supported;During functional activity Dynamic Standing - Level of Assistance: 4: Min assist Extremity Assessment  RLE Assessment RLE Assessment: Within Functional Limits LLE Assessment LLE Assessment: Exceptions to [facility] Passive Range of Motion (PROM) Comments: WFL General Strength Comments: 4- to 4/5 globally    Refer to Care Plan for Long Term Goals  Recommendations for other services: None   Discharge Criteria: Patient will be discharged from PT if patient refuses treatment 3 consecutive times without medical reason, if treatment goals not met, if there is a change in medical status, if patient makes no progress towards goals or if patient is discharged from hospital.  The above assessment, treatment plan, treatment alternatives and goals were discussed and mutually agreed upon: by patient  Veronda Gabor K Meckenzie Balsley 10/26/2018, 12:50 PM

## 2018-10-26 NOTE — Patient Outreach (Signed)
  [facility]) Care Management Chronic Special Needs Program    10/26/2018  Name: Cynthia Hunt, DOB: [birth year]  MRN: [id number]   [name] is enrolled in a chronic special needs plan for Diabetes. Client was admitted to [facility] on 10/22/18 with dx of CVA.  Client discharged from inpatient and admitted to inpatient rehab at [facility] on 10/25/18  Dudley Major RN, Maximiano Coss, CDE Chronic Care Management Coordinator [facility] Care Management [phone]

## 2018-10-26 NOTE — Evaluation (Signed)
Speech Language Pathology Assessment and Plan  Patient Details  Name: Cynthia Hunt MRN: [id number] Date of Birth: 08-08-1945  Evaluation only  Today's Date: 10/26/2018 SLP Individual Time: 1000-1100 SLP Individual Time Calculation (min): 60 min   Problem List:  Patient Active Problem List   Diagnosis Date Noted  . Acute blood loss anemia   . Hypertensive crisis   . 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 Hypokalemia   . Class 1 obesity due to excess calories with serious comorbidity and body mass index (BMI) of 32.0 to 32.9 in adult   . Poorly controlled type 2 diabetes mellitus with peripheral neuropathy ([place])   . Acute ischemic stroke ([place]) 10/22/2018  . Essential hypertension 10/22/2018  . Dyslipidemia 10/22/2018  . Uncontrolled type 2 diabetes mellitus without complication, with long-term current use of insulin ([place]) 02/13/2016   Past Medical History:  Past Medical History:  Diagnosis Date  . Depression   . Diabetes mellitus without complication ([place])   . Hyperlipidemia   . Hypertension    Past Surgical History:  Past Surgical History:  Procedure Laterality Date  . BUBBLE STUDY  10/24/2018   Procedure: BUBBLE STUDY;  Surgeon: Jerline Pain, MD;  Location: [facility] ENDOSCOPY;  Service: Cardiovascular;;  . TEE WITHOUT CARDIOVERSION N/A 10/24/2018   Procedure: TRANSESOPHAGEAL ECHOCARDIOGRAM (TEE);  Surgeon: Jerline Pain, MD;  Location: [facility] ENDOSCOPY;  Service: Cardiovascular;  Laterality: N/A;  . UTERINE FIBROID SURGERY      Assessment / Plan / Recommendation Clinical Impression   Cynthia Hunt is a 73 year old right-handed female with history of hypertension maintained on HCTZ 25 mg daily, Cozaar 50 mg daily, diabetes mellitus maintained on Glucophage 1000 mg daily as well as basaglar 35 units daily at bedtime, hyperlipidemia, marijuana use. Patient quit smoking 12 years ago.  Per chart review and patient, patient lives with her 73 year old Film/video editor.  Independent driving prior to admission. Goddaughter assist as needed. Presented 10/22/2018 with headache and left-sided weakness x2 days.  On presentation, blood pressure was noted to systolic [id number].Cranial CT scan showed age indeterminate right basal ganglia and right centrum semiovale ovale hypodensities. MRI brain ordered, personally reviewed, multiple small right brain infarcts.  MRI/MRA brain per report suggesting multifocal areas of acute infarction right MCA territory affecting the right frontal and posterior frontal cortex and subcortical white matter. 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 TEE completed 10/24/2018 showing no thrombus or vegetation ejection fraction 55%. Bubble contrast study negative.. Currently maintained on aspirin 81 mg daily and Plavix 75 mg daily 3 weeks than aspirin alone. Subcutaneous Lovenox for DVT prophylaxis.  Hospital course further complicated by significant persistent hypokalemia, supplement initiated.  Acute on chronic anemia hemoglobin 9.2. Therapy evaluations completed with recommendations of physical medicine rehabilitation consult. Patient was admitted for a comprehensive rehabilitation program.  Cognitive linguistic evaluation completed on 10/26/18. Pt presents with mild deficits in higher level cognitive function however pt describes her baseline ADLs as being very basic to semi-complex activities. Chart review confirms this information. SLP consulted with interdisciplinary team and pt is able to fully participate in session and higher level deficits are not impactful in ability to make progress while on CIR. Pt demonstrates good safety awareness and should be able to reach Mod I goals for physical therapy thereby decreasing her risk of rehopitalization or falls. Joint  decision  made to discharge ST orders for greater focus on PT/OT.    Skilled Therapeutic Interventions          Consisted of completing above mentioned cognitive linguistic evaluation. Education on s/s of stroke and all questions answered to pt's satisfaction.  Pt agreeable to above mentioned plan.    SLP Assessment  Patient does not need any further Speech [place] Pathology Services    Recommendations  Patient destination: Home Follow up Recommendations: None Equipment Recommended: None recommended by SLP           Pain Pain Assessment Pain Scale: 0-10 Pain Score: 0-No pain  Prior Functioning Cognitive/Linguistic Baseline: Within functional limits Type of Home: House Education: some college  Vocation: Retired  Industrial/product designer Term Goals: No short term Landscape architect to [place] for [place]  Recommendations for other services: Neuropsych  Discharge Criteria: Patient will be discharged from SLP if patient refuses treatment 3 consecutive times without medical reason, if treatment goals not met, if there is a change in medical status, if patient makes no progress towards goals or if patient is discharged from hospital.  The above assessment, treatment plan, treatment alternatives and goals were discussed and mutually agreed upon: by patient  Cynthia Hunt 10/26/2018, 5:30 PM

## 2018-10-26 NOTE — Progress Notes (Signed)
Pts B/Ps has been running high between 180-200 systolic.Called PA-Daniel Angiulli and is aware of high B/Ps

## 2018-10-26 NOTE — Progress Notes (Signed)
Inpatient Rehabilitation  Patient information reviewed and entered into eRehab system by Malikiah Debarr M. Kenidy Crossland, M.A., CCC/SLP, PPS Coordinator.  Information including medical coding, functional ability and quality indicators will be reviewed and updated through discharge.    

## 2018-10-27 ENCOUNTER — Inpatient Hospital Stay (HOSPITAL_COMMUNITY): Payer: HMO | Admitting: Occupational Therapy

## 2018-10-27 ENCOUNTER — Inpatient Hospital Stay (HOSPITAL_COMMUNITY): Payer: HMO | Admitting: Physical Therapy

## 2018-10-27 DIAGNOSIS — N183 Chronic kidney disease, stage 3 unspecified: Secondary | ICD-10-CM

## 2018-10-27 LAB — GLUCOSE, CAPILLARY
Glucose-Capillary: 125 mg/dL — ABNORMAL HIGH (ref 70–99)
Glucose-Capillary: 101 mg/dL — ABNORMAL HIGH (ref 70–99)
Glucose-Capillary: 206 mg/dL — ABNORMAL HIGH (ref 70–99)
Glucose-Capillary: 133 mg/dL — ABNORMAL HIGH (ref 70–99)
Glucose-Capillary: 276 mg/dL — ABNORMAL HIGH (ref 70–99)
Glucose-Capillary: 208 mg/dL — ABNORMAL HIGH (ref 70–99)
Glucose-Capillary: 115 mg/dL — ABNORMAL HIGH (ref 70–99)
Glucose-Capillary: 151 mg/dL — ABNORMAL HIGH (ref 70–99)
Glucose-Capillary: 85 mg/dL (ref 70–99)
Glucose-Capillary: 103 mg/dL — ABNORMAL HIGH (ref 70–99)
Glucose-Capillary: 171 mg/dL — ABNORMAL HIGH (ref 70–99)
Glucose-Capillary: 206 mg/dL — ABNORMAL HIGH (ref 70–99)

## 2018-10-27 MED ORDER — LOSARTAN POTASSIUM 50 MG PO TABS
50.0000 mg | ORAL_TABLET | Freq: Once | ORAL | Status: AC
  Administered 2018-10-27: 50 mg via ORAL
  Filled 2018-10-27: qty 1

## 2018-10-27 MED ORDER — LOSARTAN POTASSIUM 50 MG PO TABS
75.0000 mg | ORAL_TABLET | Freq: Every day | ORAL | Status: DC
  Administered 2018-10-28 – 2018-10-30 (×3): 75 mg via ORAL
  Filled 2018-10-27 (×2): qty 1

## 2018-10-27 MED ORDER — HYDROCHLOROTHIAZIDE 12.5 MG PO CAPS
12.5000 mg | ORAL_CAPSULE | Freq: Every day | ORAL | Status: DC
  Administered 2018-10-27 – 2018-10-29 (×3): 12.5 mg via ORAL
  Filled 2018-10-27 (×3): qty 1

## 2018-10-27 NOTE — Progress Notes (Signed)
Occupational Therapy Session Note  Patient Details  Name: Cynthia Hunt MRN: [id number] Date of Birth: 08/13/1945  Today's Date: 10/27/2018 OT Individual Time: 4158-3094 OT Individual Time Calculation (min): 70 min   Short Term Goals: Week 1:  OT Short Term Goal 1 (Week 1): STG=LTG 2/2 ELOS  Skilled Therapeutic Interventions/Progress Updates:    Pt greeted seated in recliner with ice packs on her feet. Pt reports improved foot pain and requesting to shower. Pt ambulated into the bathroom with rollator and close supervision-CGA when turning to sit on commode. Pt needed verbal cues to keep rollator with her when turning. Pt voided bladder and completed 3/3 toileting steps with supervision. Pt then ambulated to shower and was able to side step into shower with grab bars and supervision. Bathing completed with overall supervisision and intermittent CGA for balance when standing to wash bottom.OT educated pt on hemi dressing techniques with CGA. OT provided pt with red theraputty and worked on L hand there-ex and coordination with putty exercises. Pt left seated in recliner with chair alarm on and needs met.   Therapy Documentation Precautions:  Precautions Precautions: Fall Restrictions Weight Bearing Restrictions: No Pain Denies pain  Therapy/Group: Individual Therapy  Valma Cava 10/27/2018, 2:40 PM

## 2018-10-27 NOTE — Progress Notes (Signed)
Physical Therapy Session Note  Patient Details  Name: Cynthia Hunt MRN: [id number] Date of Birth: 13-Jun-1946  Today's Date: 10/27/2018 PT Individual Time: 1130-1225 PT Individual Time Calculation (min): 55 min   Short Term Goals: Week 1:  PT Short Term Goal 1 (Week 1): =LTGs due to ELOS  Skilled Therapeutic Interventions/Progress Updates:   Pt in supine and agreeable to therapy, c/o pain in B heels at L heel spur site and R bottom of foot (pt unsure why). Pt did not rate, reports feeling worse in WB. Ambulated around unit w/ rollator and supervision. Discussed use of rollator for increased independence for mobility as it has a seat and place to carry household objects. Verbal and tactile reminders throughout session for rollator parts management. Ambulated in multiple bouts of 100-150' and practiced turning to sit in rollator. Worked on bed mobility techniques w/ rolling and supine<>sit. Per pt, she has a difficult time w/ bed mobility. Performed multiple reps to work on strengthening and endurance. Attempted to transition to quadruped on mat to break down task of getting up from floor, however pt unable to fully extend elbows to reach quadruped 2/2 weakness. Returned to sitting edge of mat and worked on functional UE strengthening while hitting ball back and forth w/ 2# dowel rod, 1 min on and 1 min off x3 reps. Performed car transfer w/ supervision at both SUV and sedan height. Returned to room and ended session in recliner, all needs in reach. Ice applied to B heels. Provided w/ handout to review exercises she performed previous date to perform outside of therapy.   Therapy Documentation Precautions:  Precautions Precautions: Fall Restrictions Weight Bearing Restrictions: No  Therapy/Group: Individual Therapy  Aarin Sparkman Melton Krebs 10/27/2018, 12:36 PM

## 2018-10-27 NOTE — Progress Notes (Signed)
Physical Therapy Session Note  Patient Details  Name: FANTAZIA CLURE MRN: [id number] Date of Birth: [birth year]  Today's Date: 10/27/2018 PT Individual Time: 0823-0918 PT Individual Time Calculation (min): 55 min   Short Term Goals: Week 1:  PT Short Term Goal 1 (Week 1): =LTGs due to ELOS  Skilled Therapeutic Interventions/Progress Updates:   pt c/o Rt heel pain this session, RW used to reduce pain.  Pt performs standing balance for grooming tasks at sink with 1 UE support and supervision.  Gait with RW throughout session in controlled environment with supervision.  giat with obstacle negotiation with RW with CGA, cues for safety with RW.  Standing balance tap ups and step ups to 4'' step with 1 UE support, min A. Standing heel stretch on wedge 2 x 2 minutes with pt reporting decreased pain.  UE therex with theraband per pt request with pt performing shoulder and elbow strengthening for bilat UEs. Pt left in bed with RN present, alarm set, needs at hand.   Therapy Documentation Precautions:  Precautions Precautions: Fall Restrictions Weight Bearing Restrictions: No Pain: Pain Assessment Pain Scale: 0-10 Pain Score: 8  Pain Type: Acute pain Pain Location: Hip Pain Orientation: Right Pain Descriptors / Indicators: Sore Pain Intervention(s): RN made aware Multiple Pain Sites: Yes 2nd Pain Site Pain Score: 8 Pain Type: Acute pain Pain Location: Foot Pain Orientation: Right Pain Descriptors / Indicators: Sore Pain Intervention(s):RN made aware    Therapy/Group: Individual Therapy  Kennith Morss 10/27/2018, 9:19 AM

## 2018-10-27 NOTE — Progress Notes (Signed)
[place] PHYSICAL MEDICINE & REHABILITATION PROGRESS NOTE  Subjective/Complaints: Patient seen laying in bed this morning.  She states she slept well overnight until her blood pressure was taken this morning.  She does recall my name, but is slightly slow to answer.  She had a relatively good first day with therapies, however patient does not feel she did well.  She asks me to turn off the lights.    ROS: Denies CP, shortness of breath, nausea, vomiting, diarrhea.  Objective: Vital Signs: Blood pressure (!) 180/87, pulse 79, temperature 98.6 F (37 C), temperature source Oral, resp. rate 16, height 5\' 7"  (1.702 m), weight 93 kg, SpO2 96 %. No results found. Recent Labs    10/25/18 0502 10/26/18 0653  WBC 5.5 6.0  HGB 9.2* 10.1*  HCT 29.9* 33.5*  PLT 200 217   Recent Labs    10/25/18 0502 10/26/18 0653  NA 141 141  K 2.8* 3.5  CL 104 104  CO2 26 26  GLUCOSE 132* 107*  BUN 16 18  CREATININE 1.14* 1.24*  CALCIUM 7.4* 7.9*    Physical Exam: BP (!) 180/87 (BP Location: Right Arm) Comment: RN notified  Pulse 79   Temp 98.6 F (37 C) (Oral)   Resp 16   Ht 5\' 7"  (1.702 m)   Wt 93 kg   SpO2 96%   BMI 32.11 kg/m  Constitutional: No distress . Vital signs reviewed.  Obese. HENT: Normocephalic.  Atraumatic. Eyes: EOMI.  No discharge. Cardiovascular: No JVD. Respiratory: Normal effort. GI: Non-distended. Musc: No edema or tenderness in extremities. Neurological: She is alert.  Patient alert and oriented  Fair awareness of deficits.  Makes good eye contact with examiner. Motor: Right upper extremity/right lower extremity: 5/5 proximal distal Left upper extremity: 4-/5 proximal distal, unchanged Left lower extremity: 4/5 proximal distal, unchanged Dysarthria Right facial weakness  Skin: Skin is warm and dry.  Psychiatric: She has a normal mood and affect. Her behavior is normal.    Assessment/Plan: 1. Functional deficits secondary to multifocal right MCA  infarct which require 3+ hours per day of interdisciplinary therapy in a comprehensive inpatient rehab setting.  Physiatrist is providing close team supervision and 24 hour management of active medical problems listed below.  Physiatrist and rehab team continue to assess barriers to discharge/monitor patient progress toward functional and medical goals  Care Tool:  Bathing    Body parts bathed by patient: Right arm, Left arm, Chest, Abdomen, Front perineal area, Buttocks, Right upper leg, Left upper leg, Right lower leg, Left lower leg, Face         Bathing assist Assist Level: Contact Guard/Touching assist     Upper Body Dressing/Undressing Upper body dressing   What is the patient wearing?: Pull over shirt    Upper body assist Assist Level: Contact Guard/Touching assist    Lower Body Dressing/Undressing Lower body dressing      What is the patient wearing?: Underwear/pull up, Pants     Lower body assist Assist for lower body dressing: Minimal Assistance - Patient > 75%     Toileting Toileting    Toileting assist Assist for toileting: Minimal Assistance - Patient > 75%     Transfers Chair/bed transfer  Transfers assist     Chair/bed transfer assist level: Contact Guard/Touching assist     Locomotion Ambulation   Ambulation assist      Assist level: Minimal Assistance - Patient > 75% Assistive device: No Device Max distance: 150'   Walk 10  feet activity   Assist     Assist level: Minimal Assistance - Patient > 75% Assistive device: No Device   Walk 50 feet activity   Assist    Assist level: Minimal Assistance - Patient > 75% Assistive device: No Device    Walk 150 feet activity   Assist    Assist level: Minimal Assistance - Patient > 75% Assistive device: No Device    Walk 10 feet on uneven surface  activity   Assist           Wheelchair     Assist Will patient use wheelchair at discharge?: No   Wheelchair  activity did not occur: N/A         Wheelchair 50 feet with 2 turns activity    Assist    Wheelchair 50 feet with 2 turns activity did not occur: N/A       Wheelchair 150 feet activity     Assist Wheelchair 150 feet activity did not occur: N/A          Medical Problem List and Plan: 1.  Left sided weakness secondary to right MCA cortical and subcortical infarct on 426/2020.             Continue CIR 2.  Antithrombotics: -DVT/anticoagulation:  Lovenox             -antiplatelet therapy: aspirin 81 mg daily and Plavix 75 mg daily 3 weeks then aspirin alone 3. Pain Management: Robaxin 500 mg daily 4. Mood:  Trazodone 100 mg daily at bedtime, Xanax 0.5 mg daily as needed             -antipsychotic agents: N/A 5. Neuropsych: This patient is capable of making decisions on her own behalf. 6. Skin/Wound Care:  Routine skin checks 7. Fluids/Electrolytes/Nutrition:  Routine in and out's  8. Hypertension. Patient on Cozaar 100 mg daily, HCTZ 25 mg daily prior to admission. Resume as needed.    Cozaar 25 started on 4/30, increased to 25 on 5/1  HCTZ 12.5 started on 5/1  Blood pressure remained significantly elevated with persistent hypertensive crisis overnight, with SBP approaching 220, need to monitor closely therapies.  Monitor with increased mobility 9. Diabetes mellitus peripheral neuropathy. Hemoglobin A1c 9.0.Lantus insulin 30 units daily. Check blood sugars before meals and at bedtime.    Labile on 5/1  Monitor with increased mobility 10. History of tobacco use 12 years ago as well as marijuana use. Urine drug screen not performed on this latest admission. 11. Obesity. BMI 32.11. Dietary follow-up 12. Hyperlipidemia. Lipitor 13.  Hypokalemia:   Potassium 3.5 on 5/1, labs ordered for Monday 14.  Acute blood loss anemia  Hemoglobin 10.1 on 4/30  Continue to monitor 15.  CKD stage III  Creatinine 1.24 on 4/30, labs ordered for Monday  Continue to monitor   LOS:  2 days A FACE TO FACE EVALUATION WAS PERFORMED  Mildreth Reek Karis Juba 10/27/2018, 9:31 AM
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
## 2018-10-28 ENCOUNTER — Inpatient Hospital Stay (HOSPITAL_COMMUNITY): Payer: HMO | Admitting: Occupational Therapy

## 2018-10-28 ENCOUNTER — Inpatient Hospital Stay (HOSPITAL_COMMUNITY): Payer: HMO

## 2018-10-28 DIAGNOSIS — R7309 Other abnormal glucose: Secondary | ICD-10-CM

## 2018-10-28 DIAGNOSIS — K5901 Slow transit constipation: Secondary | ICD-10-CM

## 2018-10-28 LAB — GLUCOSE, CAPILLARY
Glucose-Capillary: 133 mg/dL — ABNORMAL HIGH (ref 70–99)
Glucose-Capillary: 139 mg/dL — ABNORMAL HIGH (ref 70–99)
Glucose-Capillary: 206 mg/dL — ABNORMAL HIGH (ref 70–99)
Glucose-Capillary: 215 mg/dL — ABNORMAL HIGH (ref 70–99)

## 2018-10-28 LAB — CULTURE, BLOOD (ROUTINE X 2)
Culture: NO GROWTH
Culture: NO GROWTH
Special Requests: ADEQUATE
Special Requests: ADEQUATE

## 2018-10-28 MED ORDER — POLYETHYLENE GLYCOL 3350 17 G PO PACK
17.0000 g | PACK | Freq: Two times a day (BID) | ORAL | Status: DC
  Administered 2018-10-28 – 2018-11-01 (×8): 17 g via ORAL
  Filled 2018-10-28 (×8): qty 1

## 2018-10-28 MED ORDER — SENNOSIDES-DOCUSATE SODIUM 8.6-50 MG PO TABS
2.0000 | ORAL_TABLET | Freq: Every day | ORAL | Status: DC
  Administered 2018-10-28 – 2018-10-31 (×4): 2 via ORAL
  Filled 2018-10-28 (×4): qty 2

## 2018-10-28 NOTE — Progress Notes (Signed)
Physical Therapy Session Note  Patient Details  Name: Cynthia Hunt MRN: [id number] Date of Birth: 03/20/1946  Today's Date: 10/28/2018 PT Individual Time: 1100-1200 PT Individual Time Calculation (min): 60 min   Short Term Goals: Week 1:  PT Short Term Goal 1 (Week 1): =LTGs due to ELOS  Skilled Therapeutic Interventions/Progress Updates:    Patient in recliner reports sore on bottom due to recliner.  Sit to stand to rollator with S.  Ambulated to therapy gym with S x 100'.  In parallel bars work on standing balance on foam tossing ball to self.  Then twisting to give and get ball to sides.  Balance feet apart eyes closed, then feet closer together eyes closed 10 sec each.  Side stepping in bars without UE support, cues for foot position.  Tandem gait with min A HHA on line in hallway.  Backwards walking and forward march at rail in hallway.  Crossing over in front, then behind at rail in hallway with min a.  Patient reports 3 falls since January this year when she had to call paramedics to help her up off the floor.  Reports lot of difficulty getting up on her knees and has not been able to turn her head for about 15 years.  Educated in safety risk as likely now increased fall risk and potentially on blood thinners on d/c.  Gait training in floor ladder for step length, foot clearance.  Stepping over and back for step length and clearance.  Negotiated 4 steps with rails and CGA self selected sequence with combo of step through and step to sequence.  Gait with SPC and CGA x 100' cues for stride length.  Sit <>stand no UE support x 5 cues for even weight shift.  Patient ambulated to room with rollator and into bathroom with RW.  Toileted with S.  Gait to recliner set up with pillows for comfort.  Left sitting with call bell in reach, chair alarm activated and needs in reach.    Therapy Documentation Precautions:  Precautions Precautions: Fall Restrictions Weight Bearing Restrictions:  No Pain: Pain Assessment Pain Score: 3  Pain Type: Neuropathic pain Pain Location: Foot Pain Orientation: Right;Left Pain Descriptors / Indicators: Dull Pain Onset: With Activity Pain Intervention(s): Repositioned;Rest    Therapy/Group: Individual Therapy  Elray Mcgregor  Sheran Lawless, PT 10/28/2018, 11:16 AM

## 2018-10-28 NOTE — Progress Notes (Signed)
Occupational Therapy Session Note  Patient Details  Name: Cynthia Hunt MRN: [id number] Date of Birth: 02-Feb-1946  Today's Date: 10/28/2018  Session 1 OT Individual Time: 0805-0900 OT Individual Time Calculation (min): 55 min   Session 2 OT Individual Time: 1240-1335 OT Individual Time Calculation (min): 55 min    Short Term Goals: Week 1:  OT Short Term Goal 1 (Week 1): STG=LTG 2/2 ELOS  Skilled Therapeutic Interventions/Progress Updates:  Session 1   Pt greeted semi-reclined in bed and agreeable to OT treatment session. Pt reports her feet are sore this morning. Pt ambulated into bathroom using RW and CGA. CGA for balance with clothing management. Pt voided bladder and completed peri-care. Pt needed verbal cues for safety to sit and remove pants as she tried to step out of them and was loosing her balance. Educated on safety precautions within BADL tasks to reduce risk of falls. Bathing completed shower level w/ close supervision for balance when standing as pt had grab bar to hold on to. Dressing completed from recliner with min verbal cues for hemi dressing techniques. Worked on standing balance and endurance with standing grooming tasks at the sink. Pt left seated in recliner at end of session with chair alarm on and needs met.   Session 2 Pt greeted sitting in recliner finisihing lunch and agreeable to OT treatment session. Pt ambulated to therapy gym with CGA and rollator. OT educated on safety technique for turning to sit onto therapy mat. Worked on standing balance/endurance with UB there-ex. Pt completed 4 sets of 10 standing bicep curls, straight arm raise, shoulder pull. Seated rest breaks in between sets. Continued working on balance standing on foam, incorporated peg board puzzle using L hand. Pt with difficulty with visiospatial orientation of puzzle requiring OT to start puzzle to help her orient diagonals. Pt also had difficulty deciphering between blue and green pegs.  Incorporated trunk rotation when placing pegs back in box. Pt ambulated back to room in similar fashion and into bathroom. Verbal cues for safety with use of rollator as pt likes to leave walker behind and reach for walls and furniture. Pt voided bladder and completed 3/3 toileting steps with supervision. Pt left seated in recliner at end of session with chair alarm on and needs met.     Therapy Documentation Precautions:  Precautions Precautions: Fall Restrictions Weight Bearing Restrictions: No Pain: Pain Assessment Pain Score: 3  Pain Type: Neuropathic pain Pain Location: Foot Pain Orientation: Right;Left Pain Descriptors / Indicators: Dull Pain Onset: With Activity Pain Intervention(s): Repositioned;Rest   Therapy/Group: Individual Therapy  Valma Cava 10/28/2018, 1:43 PM

## 2018-10-28 NOTE — Plan of Care (Signed)
  Problem: RH BOWEL ELIMINATION Goal: RH STG MANAGE BOWEL WITH ASSISTANCE Description STG Manage Bowel with Min Assistance.  Outcome: Not Progressing; LBM 4/30; sorbitol given

## 2018-10-28 NOTE — Progress Notes (Signed)
[place] PHYSICAL MEDICINE & REHABILITATION PROGRESS NOTE  Subjective/Complaints: Patient seen laying in bed this morning.  She states he slept well overnight.  She notes some soreness at the pulmonary team.  She also notes constipation.  ROS: + Feet soreness, constipation.  Denies CP, shortness of breath, nausea, vomiting, diarrhea.  Objective: Vital Signs: Blood pressure (!) 145/66, pulse 84, temperature 98 F (36.7 C), resp. rate 18, height 5\' 7"  (1.702 m), weight 93 kg, SpO2 100 %. No results found. Recent Labs    10/26/18 0653  WBC 6.0  HGB 10.1*  HCT 33.5*  PLT 217   Recent Labs    10/26/18 0653  NA 141  K 3.5  CL 104  CO2 26  GLUCOSE 107*  BUN 18  CREATININE 1.24*  CALCIUM 7.9*    Physical Exam: BP (!) 145/66 (BP Location: Left Arm)   Pulse 84   Temp 98 F (36.7 C)   Resp 18   Ht 5\' 7"  (1.702 m)   Wt 93 kg   SpO2 100%   BMI 32.11 kg/m  Constitutional: No distress . Vital signs reviewed.  Obese. HENT: Normocephalic.  Atraumatic. Eyes: EOMI.  No discharge. Cardiovascular: No JVD. Respiratory: Normal effort. GI: Non-distended. Musc: No edema or tenderness in extremities. Neurological: She is alert.  Patient alert and oriented  Fair awareness of deficits.  Makes good eye contact with examiner. Motor: Right upper extremity/right lower extremity: 5/5 proximal distal Left upper extremity: 4-/5 proximal distal, stable Left lower extremity: 4/5 proximal distal, stable Dysarthria, unchanged Right facial weakness  Skin: Skin is warm and dry.  Psychiatric: She has a normal mood and affect. Her behavior is normal.    Assessment/Plan: 1. Functional deficits secondary to multifocal right MCA infarct which require 3+ hours per day of interdisciplinary therapy in a comprehensive inpatient rehab setting.  Physiatrist is providing close team supervision and 24 hour management of active medical problems listed below.  Physiatrist and rehab team continue to  assess barriers to discharge/monitor patient progress toward functional and medical goals  Care Tool:  Bathing    Body parts bathed by patient: Right arm, Left arm, Chest, Abdomen, Front perineal area, Buttocks, Right upper leg, Left upper leg, Right lower leg, Left lower leg, Face         Bathing assist Assist Level: Contact Guard/Touching assist     Upper Body Dressing/Undressing Upper body dressing   What is the patient wearing?: Pull over shirt    Upper body assist Assist Level: Supervision/Verbal cueing    Lower Body Dressing/Undressing Lower body dressing      What is the patient wearing?: Underwear/pull up     Lower body assist Assist for lower body dressing: Contact Guard/Touching assist     Toileting Toileting    Toileting assist Assist for toileting: Supervision/Verbal cueing     Transfers Chair/bed transfer  Transfers assist     Chair/bed transfer assist level: Supervision/Verbal cueing     Locomotion Ambulation   Ambulation assist      Assist level: Contact Guard/Touching assist Assistive device: Rollator Max distance: 150'   Walk 10 feet activity   Assist     Assist level: Contact Guard/Touching assist Assistive device: Rollator   Walk 50 feet activity   Assist    Assist level: Contact Guard/Touching assist Assistive device: Rollator    Walk 150 feet activity   Assist    Assist level: Contact Guard/Touching assist Assistive device: Rollator    Walk 10 feet on  uneven surface  activity   Assist           Wheelchair     Assist Will patient use wheelchair at discharge?: No   Wheelchair activity did not occur: N/A         Wheelchair 50 feet with 2 turns activity    Assist    Wheelchair 50 feet with 2 turns activity did not occur: N/A       Wheelchair 150 feet activity     Assist Wheelchair 150 feet activity did not occur: N/A          Medical Problem List and Plan: 1.  Left  sided weakness secondary to right MCA cortical and subcortical infarct on 426/2020.             Continue CIR 2.  Antithrombotics: -DVT/anticoagulation:  Lovenox             -antiplatelet therapy: aspirin 81 mg daily and Plavix 75 mg daily 3 weeks then aspirin alone 3. Pain Management: Robaxin 500 mg daily 4. Mood:  Trazodone 100 mg daily at bedtime, Xanax 0.5 mg daily as needed             -antipsychotic agents: N/A 5. Neuropsych: This patient is capable of making decisions on her own behalf. 6. Skin/Wound Care:  Routine skin checks 7. Fluids/Electrolytes/Nutrition:  Routine in and out's  8. Hypertension. Patient on Cozaar 100 mg daily, HCTZ 25 mg daily prior to admission. Resume as needed.    Cozaar 25 started on 4/30, increased to 25 on 5/1  HCTZ 12.5 started on 5/1  Elevated, but improving on 5/2  Monitor with increased mobility 9. Diabetes mellitus peripheral neuropathy. Hemoglobin A1c 9.0.Lantus insulin 30 units daily. Check blood sugars before meals and at bedtime.    Labile on 5/2, will consider mealtime coverage  Monitor with increased mobility 10. History of tobacco use 12 years ago as well as marijuana use. Urine drug screen not performed on this latest admission. 11. Obesity. BMI 32.11. Dietary follow-up 12. Hyperlipidemia. Lipitor 13.  Hypokalemia:   Potassium 3.5 on 5/1, labs ordered for Monday 14.  Acute blood loss anemia  Hemoglobin 10.1 on 4/30  Continue to monitor 15.  CKD stage III  Creatinine 1.24 on 4/30, labs ordered for Monday  Continue to monitor 16.  Slow transit constipation  Bowel meds increased on 5/2  LOS: 3 days A FACE TO FACE EVALUATION WAS PERFORMED   Karis Jubanil  10/28/2018, 12:50 PM

## 2018-10-29 LAB — GLUCOSE, CAPILLARY
Glucose-Capillary: 107 mg/dL — ABNORMAL HIGH (ref 70–99)
Glucose-Capillary: 129 mg/dL — ABNORMAL HIGH (ref 70–99)
Glucose-Capillary: 161 mg/dL — ABNORMAL HIGH (ref 70–99)
Glucose-Capillary: 231 mg/dL — ABNORMAL HIGH (ref 70–99)

## 2018-10-29 MED ORDER — HYDROCHLOROTHIAZIDE 12.5 MG PO CAPS
12.5000 mg | ORAL_CAPSULE | Freq: Once | ORAL | Status: AC
  Administered 2018-10-29: 12.5 mg via ORAL
  Filled 2018-10-29: qty 1

## 2018-10-29 MED ORDER — HYDROCHLOROTHIAZIDE 25 MG PO TABS
25.0000 mg | ORAL_TABLET | Freq: Every day | ORAL | Status: DC
  Administered 2018-10-30 – 2018-11-01 (×3): 25 mg via ORAL
  Filled 2018-10-29 (×3): qty 1

## 2018-10-29 MED ORDER — HYDRALAZINE HCL 10 MG PO TABS
10.0000 mg | ORAL_TABLET | Freq: Three times a day (TID) | ORAL | Status: DC | PRN
  Administered 2018-10-29 – 2018-10-30 (×2): 10 mg via ORAL
  Filled 2018-10-29 (×2): qty 1

## 2018-10-29 NOTE — Progress Notes (Signed)
[place] PHYSICAL MEDICINE & REHABILITATION PROGRESS NOTE  Subjective/Complaints: Patient seen lying in bed this morning.  She states she slept well overnight.  She notes improvement in constipation.  ROS: Denies CP, shortness of breath, nausea, vomiting, diarrhea.  Objective: Vital Signs: Blood pressure (!) 155/76, pulse 79, temperature 98.3 F (36.8 C), temperature source Oral, resp. rate 18, height 5\' 7"  (1.702 m), weight 93 kg, SpO2 97 %. No results found. No results for input(s): WBC, HGB, HCT, PLT in the last 72 hours. No results for input(s): NA, K, CL, CO2, GLUCOSE, BUN, CREATININE, CALCIUM in the last 72 hours.  Physical Exam: BP (!) 155/76 (BP Location: Left Arm)   Pulse 79   Temp 98.3 F (36.8 C) (Oral)   Resp 18   Ht 5\' 7"  (1.702 m)   Wt 93 kg   SpO2 97%   BMI 32.11 kg/m  Constitutional: No distress . Vital signs reviewed.  Obese. HENT: Normocephalic.  Atraumatic. Eyes: EOMI.  No discharge. Cardiovascular: No JVD. Respiratory: Normal effort. GI: Non-distended. Musc: No edema or tenderness in extremities. Neurological: She is alert.  Patient alert and oriented  Fair awareness of deficits.  Makes good eye contact with examiner. Motor:  Left upper extremity: 4-/5 proximal distal, unchanged Left lower extremity: 4/5 proximal distal, unchanged Dysarthria, unchanged Right facial weakness unchanged Skin: Skin is warm and dry.  Psychiatric: She has a normal mood and affect. Her behavior is normal.    Assessment/Plan: 1. Functional deficits secondary to multifocal right MCA infarct which require 3+ hours per day of interdisciplinary therapy in a comprehensive inpatient rehab setting.  Physiatrist is providing close team supervision and 24 hour management of active medical problems listed below.  Physiatrist and rehab team continue to assess barriers to discharge/monitor patient progress toward functional and medical goals  Care Tool:  Bathing    Body  parts bathed by patient: Right arm, Left arm, Chest, Abdomen, Front perineal area, Buttocks, Right upper leg, Left upper leg, Right lower leg, Left lower leg, Face         Bathing assist Assist Level: Contact Guard/Touching assist     Upper Body Dressing/Undressing Upper body dressing   What is the patient wearing?: Pull over shirt    Upper body assist Assist Level: Supervision/Verbal cueing    Lower Body Dressing/Undressing Lower body dressing      What is the patient wearing?: Underwear/pull up     Lower body assist Assist for lower body dressing: Contact Guard/Touching assist     Toileting Toileting    Toileting assist Assist for toileting: Supervision/Verbal cueing     Transfers Chair/bed transfer  Transfers assist     Chair/bed transfer assist level: Supervision/Verbal cueing     Locomotion Ambulation   Ambulation assist      Assist level: Contact Guard/Touching assist Assistive device: Rollator Max distance: 150'   Walk 10 feet activity   Assist     Assist level: Contact Guard/Touching assist Assistive device: Rollator   Walk 50 feet activity   Assist    Assist level: Contact Guard/Touching assist Assistive device: Rollator    Walk 150 feet activity   Assist    Assist level: Contact Guard/Touching assist Assistive device: Rollator    Walk 10 feet on uneven surface  activity   Assist           Wheelchair     Assist Will patient use wheelchair at discharge?: No   Wheelchair activity did not occur: N/A  Wheelchair 50 feet with 2 turns activity    Assist    Wheelchair 50 feet with 2 turns activity did not occur: N/A       Wheelchair 150 feet activity     Assist Wheelchair 150 feet activity did not occur: N/A          Medical Problem List and Plan: 1.  Left sided weakness secondary to right MCA cortical and subcortical infarct on 426/2020.             Continue CIR 2.   Antithrombotics: -DVT/anticoagulation:  Lovenox             -antiplatelet therapy: aspirin 81 mg daily and Plavix 75 mg daily 3 weeks then aspirin alone 3. Pain Management: Robaxin 500 mg daily 4. Mood:  Trazodone 100 mg daily at bedtime, Xanax 0.5 mg daily as needed             -antipsychotic agents: N/A 5. Neuropsych: This patient is capable of making decisions on her own behalf. 6. Skin/Wound Care:  Routine skin checks 7. Fluids/Electrolytes/Nutrition:  Routine in and out's  8. Hypertension. Patient on Cozaar 100 mg daily, HCTZ 25 mg daily prior to admission. Resume as needed.    Cozaar 25 started on 4/30, increased to 25 on 5/1  HCTZ 12.5 started on 5/1, increased to 25 on 5/3  Monitor with increased mobility 9. Diabetes mellitus peripheral neuropathy. Hemoglobin A1c 9.0.Lantus insulin 30 units daily. Check blood sugars before meals and at bedtime.    Remains labile on 5/3, will consider mealtime coverage tomorrow if trend  Monitor with increased mobility 10. History of tobacco use 12 years ago as well as marijuana use. Urine drug screen not performed on this latest admission. 11. Obesity. BMI 32.11. Dietary follow-up 12. Hyperlipidemia. Lipitor 13.  Hypokalemia:   Potassium 3.5 on 5/1, labs ordered for tomorrow 14.  Acute blood loss anemia  Hemoglobin 10.1 on 4/30  Continue to monitor 15.  CKD stage III  Creatinine 1.24 on 4/30, labs ordered for tomorrow  Continue to monitor 16.  Slow transit constipation  Bowel meds increased on 5/2  Improving  LOS: 4 days A FACE TO FACE EVALUATION WAS PERFORMED  Cynthia Hunt 10/29/2018, 12:25 PM

## 2018-10-30 ENCOUNTER — Inpatient Hospital Stay (HOSPITAL_COMMUNITY): Payer: HMO

## 2018-10-30 ENCOUNTER — Inpatient Hospital Stay (HOSPITAL_COMMUNITY): Payer: HMO | Admitting: Occupational Therapy

## 2018-10-30 ENCOUNTER — Encounter (HOSPITAL_COMMUNITY): Payer: HMO | Admitting: Psychology

## 2018-10-30 LAB — BASIC METABOLIC PANEL
Anion gap: 11 (ref 5–15)
BUN: 24 mg/dL — ABNORMAL HIGH (ref 8–23)
CO2: 25 mmol/L (ref 22–32)
Calcium: 8.9 mg/dL (ref 8.9–10.3)
Chloride: 101 mmol/L (ref 98–111)
Creatinine, Ser: 1.45 mg/dL — ABNORMAL HIGH (ref 0.44–1.00)
GFR calc Af Amer: 42 mL/min — ABNORMAL LOW (ref >60)
GFR calc non Af Amer: 36 mL/min — ABNORMAL LOW (ref >60)
Glucose, Bld: 190 mg/dL — ABNORMAL HIGH (ref 70–99)
Potassium: 3.8 mmol/L (ref 3.5–5.1)
Sodium: 137 mmol/L (ref 135–145)

## 2018-10-30 LAB — GLUCOSE, CAPILLARY
Glucose-Capillary: 138 mg/dL — ABNORMAL HIGH (ref 70–99)
Glucose-Capillary: 141 mg/dL — ABNORMAL HIGH (ref 70–99)
Glucose-Capillary: 132 mg/dL — ABNORMAL HIGH (ref 70–99)
Glucose-Capillary: 145 mg/dL — ABNORMAL HIGH (ref 70–99)

## 2018-10-30 MED ORDER — LOSARTAN POTASSIUM 50 MG PO TABS: 100.0000 mg | ORAL_TABLET | Freq: Every day | ORAL | Status: DC

## 2018-10-30 MED ORDER — INSULIN ASPART 100 UNIT/ML ~~LOC~~ SOLN
2.0000 [IU] | Freq: Three times a day (TID) | SUBCUTANEOUS | Status: DC
  Administered 2018-10-30 – 2018-11-01 (×6): 2 [IU] via SUBCUTANEOUS

## 2018-10-30 MED ORDER — LOSARTAN POTASSIUM 50 MG PO TABS
100.0000 mg | ORAL_TABLET | Freq: Every day | ORAL | Status: DC
  Administered 2018-10-31 – 2018-11-01 (×2): 100 mg via ORAL
  Filled 2018-10-30 (×2): qty 2

## 2018-10-30 MED ORDER — LOSARTAN POTASSIUM 25 MG PO TABS
25.0000 mg | ORAL_TABLET | Freq: Once | ORAL | Status: AC
  Administered 2018-10-30: 25 mg via ORAL
  Filled 2018-10-30: qty 1

## 2018-10-30 NOTE — Care Management (Signed)
[facility] Individual Statement of Services  Patient Name:  RAMATA GENAO  Date:  10/30/2018  Welcome to the Inpatient Rehabilitation Center.  Our goal is to provide you with an individualized program based on your diagnosis and situation, designed to meet your specific needs.  With this comprehensive rehabilitation program, you will be expected to participate in at least 3 hours of rehabilitation therapies Monday-Friday, with modified therapy programming on the weekends.  Your rehabilitation program will include the following services:  Physical Therapy (PT), Occupational Therapy (OT), Speech Therapy (ST), 24 hour per day rehabilitation nursing, Neuropsychology, Case Management (Social Worker), Rehabilitation Medicine, Nutrition Services and Pharmacy Services  Weekly team conferences will be held on Wednesdays to discuss your progress.  Your Social Worker will talk with you frequently to get your input and to update you on team discussions.  Team conferences with you and your family in attendance may also be held.  Expected length of stay: 5-7 days  Overall anticipated outcome: independent  Depending on your progress and recovery, your program may change. Your Social Worker will coordinate services and will keep you informed of any changes. Your Social Worker's name and contact numbers are listed  below.  The following services may also be recommended but are not provided by the Inpatient Rehabilitation Center:   Driving Evaluations  Home Health Rehabiltiation Services  Outpatient Rehabilitation Services   Arrangements will be made to provide these services after discharge if needed.  Arrangements include referral to agencies that provide these services.  Your insurance has been verified to be:  [employer] Your primary doctor is:  [place]  Pertinent information will be shared with your doctor and your insurance company.  Social Worker:  [place], [place]  [phone] or [phone]   Information discussed with and copy given to patient by: Amada Jupiter, 10/30/2018, 2:35 PM

## 2018-10-30 NOTE — Progress Notes (Addendum)
Physical Therapy Session Note  Patient Details  Name: Cynthia Hunt MRN: [id number] Date of Birth: 21-Feb-1946  Today's Date: 10/30/2018 PT Individual Time: 1330-1430 PT Individual Time Calculation (min): 60 min   Short Term Goals: Week 1:  PT Short Term Goal 1 (Week 1): =LTGs due to ELOS  Skilled Therapeutic Interventions/Progress Updates:     Patient ambulating from the bathroom with RN upon PT arrival. Patient alert and agreeable to PT session.  Therapeutic Activity: Transfers: Patient performed sit to/from stand x5 with mod I with rollator and RW and supervision for safety without AD. Demonstrated safe use of transitioning into sitting with rollator with min cues from PT for breaks with supervision.  Gait Training:  Patient ambulated 120 feet using rollator, 50 feet with the SPC, and 100 feet and 150 feet without an AD with supervision. Ambulated with decreased gait speed, decreased step length, increased hip and knee flexion, forward trunk lean, and downward head gaze. Patient ambulated holding the [facility] off of the ground for several steps at a time before placing it down x2. Provided verbal cues for increased gait speed without an AD and erect posture with rollator. Went up and down a single step with a RW x1, patient was holding the RW off the ground while turning and use very little UE support. Went up and down the step x3 without AD with HHA x1 and close supervision x2.    Therapeutic Exercise: Patient performed the following exercises with verbal and tactile cues for proper technique. -B UE rows in sitting with orange band 2x15, cues for shoulder depression and retraction and erect posture  Patient in recliner at end of session with breaks locked and all needs within reach. Educated patient on fall prevention at home and calling 911 in the event of a fall.  Therapy Documentation Precautions:  Precautions Precautions: Fall Restrictions Weight Bearing Restrictions:  No Pain: Patient denied pain throughout session.   Therapy/Group: Individual Therapy  [name] L Taequan Stockhausen PT, DPT  10/30/2018, 4:40 PM

## 2018-10-30 NOTE — Progress Notes (Signed)
Occupational Therapy Session Note  Patient Details  Name: Cynthia Hunt MRN: [id number] Date of Birth: 18-May-1946  Today's Date: 10/30/2018 OT Individual Time: 1515-1630 OT Individual Time Calculation (min): 75 min    Short Term Goals: Week 1:  OT Short Term Goal 1 (Week 1): STG=LTG 2/2 ELOS  Skilled Therapeutic Interventions/Progress Updates:    Pt received sitting up in recliner with no c/o pain. Pt completed functional mobility using rollator to ADL apartment. Extensive edu/demo re Counsellor with rollator, including energy conservation strategies and transporting items. Pt then completed functional mobility to therapy gym. Pt completed several sit <> stands from EOM with no UE support. Pt completed functional reaching holding 1 kg ball with cueing for technique. Pt completed item retrieval from floor level to simulate IADL task of feeding cat, min A provided.  Pt then transitioned to quadroped on mat to practice crawling and transitional movement associated with floor transfers. Pt unable to motor plan moving BLE to come into prone from sidelying despite heavy cueing. Pt eventually able to push up into sidelying to sit. Pt scooted off mat and returned to quad on mat. Bench placed anterior for pt to rest BUE on. Pt instructed in hip extension with resistance from theraband. Pt completed 2 sets of push up from elevated bench while tall kneeling. Pt used rollator to return to room. Pt completed toileting with close (S). Pt left sitting up in recliner with all needs met, NT present.   Therapy Documentation Precautions:  Precautions Precautions: Fall Restrictions Weight Bearing Restrictions: No   Therapy/Group: Individual Therapy  Curtis Sites 10/30/2018, 7:12 AM

## 2018-10-30 NOTE — Progress Notes (Signed)
[place] PHYSICAL MEDICINE & REHABILITATION PROGRESS NOTE  Subjective/Complaints: Patient seen laying in bed this morning.  She states she slept well overnight.  She states that she feels weaker today because she did not give any therapies yesterday.  She does note that she had a bowel movement yesterday.  Called by nursing overnight regarding hypertensive crisis.  ROS: Denies CP, shortness of breath, nausea, vomiting, diarrhea.  Objective: Vital Signs: Blood pressure (!) 153/68, pulse 68, temperature 98 F (36.7 C), temperature source Oral, resp. rate 18, height 5\' 7"  (1.702 m), weight 93 kg, SpO2 99 %. No results found. No results for input(s): WBC, HGB, HCT, PLT in the last 72 hours. No results for input(s): NA, K, CL, CO2, GLUCOSE, BUN, CREATININE, CALCIUM in the last 72 hours.  Physical Exam: BP (!) 153/68 (BP Location: Right Arm)   Pulse 68   Temp 98 F (36.7 C) (Oral)   Resp 18   Ht 5\' 7"  (1.702 m)   Wt 93 kg   SpO2 99%   BMI 32.11 kg/m  Constitutional: No distress . Vital signs reviewed. HENT: Normocephalic.  Atraumatic. Eyes: EOMI. No discharge. Cardiovascular: No JVD. Respiratory: Normal effort. GI: Non-distended. Musc: No edema or tenderness in extremities. Neurological: She is alert.  Patient alert and oriented  Fair awareness of deficits.  Makes good eye contact with examiner. Motor:  Left upper extremity: 4/5 proximal distal Left lower extremity: 4-/5 proximal distal Dysarthria, unchanged Right facial weakness unchanged Skin: Skin is warm and dry.  Psychiatric: She has a normal mood and affect. Her behavior is normal.    Assessment/Plan: 1. Functional deficits secondary to multifocal right MCA infarct which require 3+ hours per day of interdisciplinary therapy in a comprehensive inpatient rehab setting.  Physiatrist is providing close team supervision and 24 hour management of active medical problems listed below.  Physiatrist and rehab team  continue to assess barriers to discharge/monitor patient progress toward functional and medical goals  Care Tool:  Bathing    Body parts bathed by patient: Right arm, Left arm, Chest, Abdomen, Front perineal area, Buttocks, Right upper leg, Left upper leg, Right lower leg, Left lower leg, Face         Bathing assist Assist Level: Contact Guard/Touching assist     Upper Body Dressing/Undressing Upper body dressing   What is the patient wearing?: Pull over shirt    Upper body assist Assist Level: Supervision/Verbal cueing    Lower Body Dressing/Undressing Lower body dressing      What is the patient wearing?: Underwear/pull up     Lower body assist Assist for lower body dressing: Contact Guard/Touching assist     Toileting Toileting    Toileting assist Assist for toileting: Supervision/Verbal cueing     Transfers Chair/bed transfer  Transfers assist     Chair/bed transfer assist level: Supervision/Verbal cueing     Locomotion Ambulation   Ambulation assist      Assist level: Contact Guard/Touching assist Assistive device: Rollator Max distance: 150'   Walk 10 feet activity   Assist     Assist level: Contact Guard/Touching assist Assistive device: Rollator   Walk 50 feet activity   Assist    Assist level: Contact Guard/Touching assist Assistive device: Rollator    Walk 150 feet activity   Assist    Assist level: Contact Guard/Touching assist Assistive device: Rollator    Walk 10 feet on uneven surface  activity   Assist  Wheelchair     Assist Will patient use wheelchair at discharge?: No   Wheelchair activity did not occur: N/A         Wheelchair 50 feet with 2 turns activity    Assist    Wheelchair 50 feet with 2 turns activity did not occur: N/A       Wheelchair 150 feet activity     Assist Wheelchair 150 feet activity did not occur: N/A          Medical Problem List and  Plan: 1.  Left sided weakness secondary to right MCA cortical and subcortical infarct on 426/2020.             Continue CIR 2.  Antithrombotics: -DVT/anticoagulation:  Lovenox             -antiplatelet therapy: aspirin 81 mg daily and Plavix 75 mg daily 3 weeks then aspirin alone 3. Pain Management: Robaxin 500 mg daily 4. Mood:  Trazodone 100 mg daily at bedtime, Xanax 0.5 mg daily as needed             -antipsychotic agents: N/A 5. Neuropsych: This patient is capable of making decisions on her own behalf. 6. Skin/Wound Care:  Routine skin checks 7. Fluids/Electrolytes/Nutrition:  Routine in and out's  8. Hypertension. Patient on Cozaar 100 mg daily, HCTZ 25 mg daily prior to admission. Resume as needed.    Cozaar increased to 100 on 5/4  HCTZ 12.5 started on 5/1, increased to 25 on 5/3  Hydralazine as needed started on 5/3  Hypertensive crisis overnight  Monitor with increased mobility 9. Diabetes mellitus peripheral neuropathy. Hemoglobin A1c 9.0.Lantus insulin 30 units daily. Check blood sugars before meals and at bedtime.    NovoLog 2 units 3 times daily started on 5/4  Labile on 5/4  Monitor with increased mobility 10. History of tobacco use 12 years ago as well as marijuana use. Urine drug screen not performed on this latest admission. 11. Obesity. BMI 32.11. Dietary follow-up 12. Hyperlipidemia. Lipitor 13.  Hypokalemia:   Potassium 3.5 on 5/1, labs pending 14.  Acute blood loss anemia  Hemoglobin 10.1 on 4/30  Continue to monitor 15.  CKD stage III  Creatinine 1.24 on 4/30, labs pending  Continue to monitor 16.  Slow transit constipation  Bowel meds increased on 5/2  Improving  LOS: 5 days A FACE TO FACE EVALUATION WAS PERFORMED  Cynthia Hunt 10/30/2018, 8:25 AM
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
## 2018-10-30 NOTE — Progress Notes (Signed)
Occupational Therapy Session Note  Patient Details  Name: Cynthia Hunt MRN: [id number] Date of Birth: 1945-08-10  Today's Date: 10/30/2018 OT Individual Time: 1045-1200 OT Individual Time Calculation (min): 75 min    Short Term Goals: Week 1:  OT Short Term Goal 1 (Week 1): STG=LTG 2/2 ELOS  Skilled Therapeutic Interventions/Progress Updates:    Pt greeted seated in recliner and agreeable to OT treatment session. Pt came to standing with increased time and supervision w/ RW. Pt ambulated to bathroom w/ RW and close supervision. Pt voided bowel and bladder and completed peri-care with supervision. Pt needed min verbal cues for safety awareness to sit down to remove pants and shirt. Bathing completed with overall supervision min verbal cues for safe technique when washing LEs. Dressing completed from recliner with supervision and increased time. Pt reports her goddaughter who has been living with her has plans to leave and stay with other family. She states her Aunt from [place] will be coming down to [place] to help her out at dc. Pt ambulated from room to chairs by elevator with RW and increased time. Focus on upright posture while ambulating. Pt took rest break, then ambulated back to room in similar fashion. Pt left seated in recliner with alarm belt on and needs met.   Therapy Documentation Precautions:  Precautions Precautions: Fall Restrictions Weight Bearing Restrictions: No Pain:   denies pain   Therapy/Group: Individual Therapy  Valma Cava 10/30/2018, 11:13 AM

## 2018-10-31 ENCOUNTER — Inpatient Hospital Stay (HOSPITAL_COMMUNITY): Payer: HMO | Admitting: Physical Therapy

## 2018-10-31 ENCOUNTER — Inpatient Hospital Stay (HOSPITAL_COMMUNITY): Payer: HMO | Admitting: Occupational Therapy

## 2018-10-31 ENCOUNTER — Inpatient Hospital Stay (HOSPITAL_COMMUNITY): Payer: HMO

## 2018-10-31 DIAGNOSIS — I10 Essential (primary) hypertension: Secondary | ICD-10-CM

## 2018-10-31 LAB — GLUCOSE, CAPILLARY
Glucose-Capillary: 98 mg/dL (ref 70–99)
Glucose-Capillary: 102 mg/dL — ABNORMAL HIGH (ref 70–99)
Glucose-Capillary: 205 mg/dL — ABNORMAL HIGH (ref 70–99)
Glucose-Capillary: 198 mg/dL — ABNORMAL HIGH (ref 70–99)

## 2018-10-31 NOTE — Discharge Summary (Addendum)
Physician Discharge Summary  Patient ID: [name] MRN: [id number] DOB/AGE: [birth year] 73 y.o.  Admit date: 10/25/2018 Discharge date: 11/01/2018  Discharge Diagnoses:  Active Problems:   Right middle cerebral artery stroke (HCC)   Hypokalemia   Class 1 obesity due to excess calories with serious comorbidity and body mass index (BMI) of 32.0 to 32.9 in adult   Poorly controlled type 2 diabetes mellitus with peripheral neuropathy (HCC)   Acute blood loss anemia   Hypertensive crisis   CKD (chronic kidney disease), stage III (HCC)   Slow transit constipation   Labile blood glucose History of tobacco abuse as well as marijuana  Discharged Condition: Stable  Significant Diagnostic Studies: Ct Head Wo Contrast  Result Date: 10/22/2018 CLINICAL DATA:  Increasing LEFT-sided weakness. Symptoms began 2 days ago. EXAM: CT HEAD WITHOUT CONTRAST TECHNIQUE: Contiguous axial images were obtained from the base of the skull through the vertex without intravenous contrast. COMPARISON:  No priors for comparison. FINDINGS: Brain: Age-indeterminate hypodensity of RIGHT basal ganglia, between the caudate and lentiform nucleus, could represent developing acute infarction. Similarly, asymmetric hypoattenuation of the RIGHT centrum semiovale, could be acute or chronic. No definite acute cortical hypodensities. No hemorrhage, mass lesion, hydrocephalus, or extra-axial fluid. Mild cerebral and cerebellar atrophy. Generalized white matter hypoattenuation could represent small vessel disease. Vascular: Calcification of the cavernous internal carotid arteries consistent with cerebrovascular atherosclerotic disease. No signs of intracranial large vessel occlusion. Skull: Intact. Sinuses/Orbits: No acute sinus findings. Conjugate gaze to the RIGHT. Other: None. IMPRESSION: 1. Age-indeterminate RIGHT basal ganglia and RIGHT centrum semiovale hypodensities could represent acute or chronic infarction. 2. If no  contraindications, MRI of the brain without contrast recommended for further evaluation. Electronically Signed   By: Elsie Stain M.D.   On: 10/22/2018 08:28   Mr Cynthia Hunt Head Wo Contrast  Result Date: 10/22/2018 CLINICAL DATA:  Stroke-like symptoms. Slipped off bed, and could not get off floor. Reportedly, LEFT hemiparesis for 2 days, but patient did not want to come to hospital. Stroke risk factors include uncontrolled type 2 diabetes mellitus, essential hypertension and dyslipidemia. EXAM: MR HEAD WITHOUT CONTRAST MR CIRCLE OF WILLIS WITHOUT CONTRAST MRA OF THE NECK WITHOUT AND WITH CONTRAST TECHNIQUE: Multiplanar, multiecho pulse sequences of the brain, circle of Willis and surrounding structures were obtained without intravenous contrast. Angiographic images of the neck were obtained using MRA technique without and with intravenous contrast. CONTRAST:  Gadavist 10 mL. COMPARISON:  Noncontrast CT head earlier today. FINDINGS: MR HEAD FINDINGS Brain: Multifocal areas of restricted diffusion, corresponding low ADC, affect the RIGHT frontal cortex, posterior frontal cortex, and subcortical white matter consistent with acute nonhemorrhagic infarcts. One area of acute infarction, roughly 8 mm in diameter, corresponds with the abnormality noted on CT earlier today. The second area questioned on earlier CT, in the RIGHT basal ganglia, represents an area of chronic infarction. No mass lesion, hydrocephalus, or extra-axial fluid. Generalized atrophy. Moderately advanced T2 and FLAIR hyperintensities throughout the white matter, likely small vessel disease. Vascular: Reported separately. Skull and upper cervical spine: No acute findings. Partial empty sella. Sinuses/Orbits: Mucosal thickening.  Negative orbits. Other: None. MR CIRCLE OF WILLIS FINDINGS RIGHT ICA: Nonstenotic irregularity in its cavernous segment. Otherwise widely patent. M1 MCA widely patent. A1 ACA widely patent. High-grade 75% or greater stenosis of  the superior division M2 MCA on the RIGHT. See series 5 image 122. This is likely contributory to the observed pattern of infarction. LEFT ICA: 50% cavernous stenosis. 50-75% stenosis at the junction  of the cavernous and supraclinoid ICA. LEFT M1 MCA widely patent. 50% stenosis origin LEFT A1 ACA. Nonstenotic irregularity LEFT M2 vessels. Distal anterior cerebral arteries are mildly irregular but patent. Basilar artery mildly irregular but patent. RIGHT vertebral dominant contributor. LEFT vertebral diminutive but patent. Fetal origin LEFT PCA. RIGHT PCA widely patent. No cerebellar branch occlusion. No saccular aneurysm. MRA NECK FINDINGS Dolichoectatic great vessels, but no proximal stenosis. Nonstenotic irregularity RIGHT carotid bifurcation. Nonstenotic irregularity LEFT carotid bifurcation. Both vertebral arteries are patent without significant ostial stenosis, RIGHT dominant. IMPRESSION: Multifocal areas of acute infarction, RIGHT MCA territory, affect the RIGHT frontal and posterior frontal cortex and subcortical white matter. High-grade stenosis of the RIGHT M2 MCA superior division is likely contributory to the observed pattern of ischemia. 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 No intracranial hemorrhage or mass. 50-75% LEFT ICA cavernous/supraclinoid stenosis; otherwise, no proximal flow-limiting stenosis of the carotid or basilar arteries. Extracranial vasculature is dolichoectatic without flow-limiting stenosis, dissection, or occlusion. Minor atheromatous change both carotid bifurcations. Electronically Signed   By: Elsie Stain M.D.   On: 10/22/2018 14:22   Mr Angiogram Neck W Or Wo Contrast  Result Date: 10/22/2018 CLINICAL DATA:  Stroke-like symptoms. Slipped off bed, and could not get off floor. Reportedly, LEFT hemiparesis for 2 days, but patient did not want to come to hospital. Stroke risk factors include uncontrolled type 2 diabetes  mellitus, essential hypertension and dyslipidemia. EXAM: MR HEAD WITHOUT CONTRAST MR CIRCLE OF WILLIS WITHOUT CONTRAST MRA OF THE NECK WITHOUT AND WITH CONTRAST TECHNIQUE: Multiplanar, multiecho pulse sequences of the brain, circle of Willis and surrounding structures were obtained without intravenous contrast. Angiographic images of the neck were obtained using MRA technique without and with intravenous contrast. CONTRAST:  Gadavist 10 mL. COMPARISON:  Noncontrast CT head earlier today. FINDINGS: MR HEAD FINDINGS Brain: Multifocal areas of restricted diffusion, corresponding low ADC, affect the RIGHT frontal cortex, posterior frontal cortex, and subcortical white matter consistent with acute nonhemorrhagic infarcts. One area of acute infarction, roughly 8 mm in diameter, corresponds with the abnormality noted on CT earlier today. The second area questioned on earlier CT, in the RIGHT basal ganglia, represents an area of chronic infarction. No mass lesion, hydrocephalus, or extra-axial fluid. Generalized atrophy. Moderately advanced T2 and FLAIR hyperintensities throughout the white matter, likely small vessel disease. Vascular: Reported separately. Skull and upper cervical spine: No acute findings. Partial empty sella. Sinuses/Orbits: Mucosal thickening.  Negative orbits. Other: None. MR CIRCLE OF WILLIS FINDINGS RIGHT ICA: Nonstenotic irregularity in its cavernous segment. Otherwise widely patent. M1 MCA widely patent. A1 ACA widely patent. High-grade 75% or greater stenosis of the superior division M2 MCA on the RIGHT. See series 5 image 122. This is likely contributory to the observed pattern of infarction. LEFT ICA: 50% cavernous stenosis. 50-75% stenosis at the junction of the cavernous and supraclinoid ICA. LEFT M1 MCA widely patent. 50% stenosis origin LEFT A1 ACA. Nonstenotic irregularity LEFT M2 vessels. Distal anterior cerebral arteries are mildly irregular but patent. Basilar artery mildly irregular but  patent. RIGHT vertebral dominant contributor. LEFT vertebral diminutive but patent. Fetal origin LEFT PCA. RIGHT PCA widely patent. No cerebellar branch occlusion. No saccular aneurysm. MRA NECK FINDINGS Dolichoectatic great vessels, but no proximal stenosis. Nonstenotic irregularity RIGHT carotid bifurcation. Nonstenotic irregularity LEFT carotid bifurcation. Both vertebral arteries are patent without significant ostial stenosis, RIGHT dominant. IMPRESSION: Multifocal areas of acute infarction, RIGHT MCA territory, affect the RIGHT frontal and posterior frontal  cortex and subcortical white matter. High-grade stenosis of the RIGHT M2 MCA superior division is likely contributory to the observed pattern of ischemia. 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 No intracranial hemorrhage or mass. 50-75% LEFT ICA cavernous/supraclinoid stenosis; otherwise, no proximal flow-limiting stenosis of the carotid or basilar arteries. Extracranial vasculature is dolichoectatic without flow-limiting stenosis, dissection, or occlusion. Minor atheromatous change both carotid bifurcations. Electronically Signed   By: Elsie StainJohn T Curnes M.D.   On: 10/22/2018 14:22   Mr Brain Wo Contrast  Result Date: 10/22/2018 CLINICAL DATA:  Stroke-like symptoms. Slipped off bed, and could not get off floor. Reportedly, LEFT hemiparesis for 2 days, but patient did not want to come to hospital. Stroke risk factors include uncontrolled type 2 diabetes mellitus, essential hypertension and dyslipidemia. EXAM: MR HEAD WITHOUT CONTRAST MR CIRCLE OF WILLIS WITHOUT CONTRAST MRA OF THE NECK WITHOUT AND WITH CONTRAST TECHNIQUE: Multiplanar, multiecho pulse sequences of the brain, circle of Willis and surrounding structures were obtained without intravenous contrast. Angiographic images of the neck were obtained using MRA technique without and with intravenous contrast. CONTRAST:  Gadavist 10 mL. COMPARISON:   Noncontrast CT head earlier today. FINDINGS: MR HEAD FINDINGS Brain: Multifocal areas of restricted diffusion, corresponding low ADC, affect the RIGHT frontal cortex, posterior frontal cortex, and subcortical white matter consistent with acute nonhemorrhagic infarcts. One area of acute infarction, roughly 8 mm in diameter, corresponds with the abnormality noted on CT earlier today. The second area questioned on earlier CT, in the RIGHT basal ganglia, represents an area of chronic infarction. No mass lesion, hydrocephalus, or extra-axial fluid. Generalized atrophy. Moderately advanced T2 and FLAIR hyperintensities throughout the white matter, likely small vessel disease. Vascular: Reported separately. Skull and upper cervical spine: No acute findings. Partial empty sella. Sinuses/Orbits: Mucosal thickening.  Negative orbits. Other: None. MR CIRCLE OF WILLIS FINDINGS RIGHT ICA: Nonstenotic irregularity in its cavernous segment. Otherwise widely patent. M1 MCA widely patent. A1 ACA widely patent. High-grade 75% or greater stenosis of the superior division M2 MCA on the RIGHT. See series 5 image 122. This is likely contributory to the observed pattern of infarction. LEFT ICA: 50% cavernous stenosis. 50-75% stenosis at the junction of the cavernous and supraclinoid ICA. LEFT M1 MCA widely patent. 50% stenosis origin LEFT A1 ACA. Nonstenotic irregularity LEFT M2 vessels. Distal anterior cerebral arteries are mildly irregular but patent. Basilar artery mildly irregular but patent. RIGHT vertebral dominant contributor. LEFT vertebral diminutive but patent. Fetal origin LEFT PCA. RIGHT PCA widely patent. No cerebellar branch occlusion. No saccular aneurysm. MRA NECK FINDINGS Dolichoectatic great vessels, but no proximal stenosis. Nonstenotic irregularity RIGHT carotid bifurcation. Nonstenotic irregularity LEFT carotid bifurcation. Both vertebral arteries are patent without significant ostial stenosis, RIGHT dominant.  IMPRESSION: Multifocal areas of acute infarction, RIGHT MCA territory, affect the RIGHT frontal and posterior frontal cortex and subcortical white matter. High-grade stenosis of the RIGHT M2 MCA superior division is likely contributory to the observed pattern of ischemia. 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 No intracranial hemorrhage or mass. 50-75% LEFT ICA cavernous/supraclinoid stenosis; otherwise, no proximal flow-limiting stenosis of the carotid or basilar arteries. Extracranial vasculature is dolichoectatic without flow-limiting stenosis, dissection, or occlusion. Minor atheromatous change both carotid bifurcations. Electronically Signed   By: Elsie StainJohn T Curnes M.D.   On: 10/22/2018 14:22   [facility] [place] [facility]  Result Date: 10/23/2018 Carotid Arterial Duplex Study Indications: CVA. Performing Technologist: Blanch MediaMegan Riddle  RVS  Examination Guidelines: A complete evaluation includes B-mode imaging, spectral Doppler, color Doppler, and power Doppler as needed of all accessible portions of each vessel. Bilateral testing is considered an integral part of a complete examination. Limited examinations for reoccurring indications may be performed as noted.  Right Carotid Findings: +----------+--------+--------+--------+------------+--------+           PSV cm/sEDV cm/sStenosisDescribe    Comments +----------+--------+--------+--------+------------+--------+ CCA Prox  45                      heterogenous         +----------+--------+--------+--------+------------+--------+ CCA Distal61      14              heterogenous         +----------+--------+--------+--------+------------+--------+ ICA Prox  75      25      1-39%   heterogenous         +----------+--------+--------+--------+------------+--------+ ICA Distal59      19                                   +----------+--------+--------+--------+------------+--------+ ECA        113     6                                    +----------+--------+--------+--------+------------+--------+ +----------+--------+-------+--------+-------------------+           PSV cm/sEDV cmsDescribeArm Pressure (mmHG) +----------+--------+-------+--------+-------------------+ [id number]                                         +----------+--------+-------+--------+-------------------+ +---------+--------+--+--------+--+---------+ VertebralPSV cm/s53EDV cm/s16Antegrade +---------+--------+--+--------+--+---------+  Left Carotid Findings: +----------+--------+--------+--------+------------+--------+           PSV cm/sEDV cm/sStenosisDescribe    Comments +----------+--------+--------+--------+------------+--------+ CCA Prox  111     17              heterogenous         +----------+--------+--------+--------+------------+--------+ CCA Distal66      14              heterogenous         +----------+--------+--------+--------+------------+--------+ ICA Prox  86      27      1-39%   heterogenous         +----------+--------+--------+--------+------------+--------+ ICA Distal56      18                                   +----------+--------+--------+--------+------------+--------+ ECA       85      7                                    +----------+--------+--------+--------+------------+--------+ +----------+--------+--------+--------+-------------------+ SubclavianPSV cm/sEDV cm/sDescribeArm Pressure (mmHG) +----------+--------+--------+--------+-------------------+           93                                          +----------+--------+--------+--------+-------------------+ +---------+--------+--+--------+--+---------+ VertebralPSV cm/s32EDV cm/s10Antegrade +---------+--------+--+--------+--+---------+  Summary:   *See table(s) above  for measurements and observations.  Electronically signed by Delia Heady MD on 10/23/2018 at  1:47:46 PM.    Final     Labs:  Basic Metabolic Panel: Recent Labs  Lab 10/25/18 0502 10/26/18 0653 10/30/18 0943  NA 141 141 137  K 2.8* 3.5 3.8  CL 104 104 101  CO2 GLUCOSE 132* 107* 190*  BUN 16 18 24*  CREATININE 1.14* 1.24* 1.45*  CALCIUM 7.4* 7.9* 8.9    CBC: Recent Labs  Lab 10/25/18 0502 10/26/18 0653  WBC 5.5 6.0  NEUTROABS  --  3.4  HGB 9.2* 10.1*  HCT 29.9* 33.5*  MCV 91.7 92.3  PLT 200 217    CBG: Recent Labs  Lab 10/30/18 0628 10/30/18 1127 10/30/18 1632 10/30/18 2137 10/31/18 0647  GLUCAP 138* 141* 132* 145* 98  Family history Mother with CVA.  Denies any cancer or diabetes  Brief HPI:    ACIRE TANG is a 73 year old right-handed female with history of hypertension maintained on HCTZ, Cozaar 50 mg daily, diabetes mellitus maintained on Glucophage as well as basaglar, hyperlipidemia and marijuana use.  Quit smoking 12 years ago.  Per chart review lives with 73 year old goddaughter.  Independent driving prior to admission.  Presented 10/22/2018 with headache and left side weakness x2 days.  Blood pressure noted systolic 180-200s.  Cranial CT scan showed age-indeterminate right basal ganglia and right centrum semi-ovale hypodensities.  MRI multiple small right brain infarctions.  MRI/MRA suggesting multifocal areas of acute infarction right MCA territory affecting the right frontal and posterior frontal cortex and subcortical white matter.  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  50 to 75% left ICA cavernous supraclinoid stenosis.  Patient did not receive TPA.  Echocardiogram with ejection fraction of 60% abnormal aortic valve could not exclude vegetation.  TEE completed showing no thrombus or vegetation.  Bubble contrast study negative.  Currently maintained on aspirin and Plavix x3 weeks then aspirin alone.  Subcutaneous Lovenox for DVT prophylaxis.  Hospital course complicated by persistent hypokalemia with supplement  added.  Acute on chronic anemia 9.2.  Therapy evaluations completed and patient was admitted for a comprehensive rehab program  Hospital Course: ABBYGAEL CURTISS was admitted to rehab 10/25/2018 for inpatient therapies to consist of PT, ST and OT at least three hours five days a week. Past admission physiatrist, therapy team and rehab RN have worked together to provide customized collaborative inpatient rehab.  Pertaining to patient right MCA cortical and subcortical infarction.  She would follow-up with neurology services.  Aspirin and Plavix x3 weeks then aspirin alone.  Patient maintained on subcutaneous Lovenox for DVT prophylaxis.  Mood stabilization with trazodone scheduled as well as Xanax as needed and emotional support provided.  Blood pressures controlled monitored Cozaar increased to 100 mg daily as well as continued on low-dose HCTZ.  She would follow-up with primary care provider.  She remained on Lipitor for hyperlipidemia.  Blood sugars controlled hemoglobin A1c of 9.  She was on Lantus insulin full diabetic teaching completed.  Hypokalemia stable potassium 3.8.  Acute on chronic anemia 10.1 no bleeding episodes.   Rehab course: During patient's stay in rehab weekly team conferences were held to monitor patient's progress, set goals and discuss barriers to discharge. At admission, patient required minimal assist 100 feet rolling walker, minimal assist sit to stand, supervision supine to sit.  Minimal assist upper body bathing minimal assist lower body bathing minimal assist upper body dressing minimal assist lower body  dressing minimal assist toilet transfers.  Physical exam.  Blood pressure 170/69 pulse 85 temperature 98.8 respirations 16 oxygen saturations 98% room air Constitutional.  No acute distress well developed HEENT Head.  Normocephalic and atraumatic Eyes EOMs normal no discharge without nystagmus Respiratory effort normal no distress without wheeze GI exhibits no distention  nontender without rebound Musculoskeletal no edema or tenderness in extremities Neurological alert follows commands fair awareness of deficits right upper extremity right lower extremity 5 out of 5 proximal to distal left upper extremity 4- out of 5 proximal to distal left lower extremity 3- out of 5 proximal to distal  /She  has had improvement in activity tolerance, balance, postural control as well as ability to compensate for deficits. She has had improvement in functional use RUE/LUE  and RLE/LLE as well as improvement in awareness patient ambulates 120 feet with a rolling walker 50 feet with a straight point cane ambulates with decreased gait speed.  She could take several steps with straight point cane off of the ground.  Navigate one step rolling walker.  Completed functional mobility using Rollator walker to the ADL apartment.  Completed functional mobility to the therapy gym.  Gathered belongings for activities delivered and homemaking completed toileting with close supervision.  Family teaching completed and patient discharged to home       Disposition:  Discharge to home   Diet: Diabetic diet  Special Instructions: Aspirin and Plavix x3 months then aspirin alone  Medications at discharge 1.  Tylenol as needed 2.  Xanax 0.5 mg daily as needed 3.  Aspirin 81 mg p.o. daily 4.  Lipitor 40 mg p.o. daily 5.  Plavix 75 mg daily 6.  HCTZ 25 mg p.o. daily 7.  Lantus insulin 30 units nightly 8.  Cozaar 100 mg p.o. daily 9.  Robaxin 500 mg p.o. daily 10.  MiraLAX twice daily hold for loose stools 11.  Senokot S2 tablets nightly hold for loose stools 12.  Trazodone 100 mg p.o. nightly  Discharge Instructions    Ambulatory referral to Neurology   Complete by:  As directed    An appointment is requested in approximately follow-up 4 to 6 weeks right MCA infarction   Ambulatory referral to Physical Medicine Rehab   Complete by:  As directed    Moderate complexity follow-up 1 to 2  weeks right MCA infarction      Follow-up Information    Marcello Fennel, MD Follow up.   Specialty:  Physical Medicine and Rehabilitation Why:  Office to call for appointment Contact information: [address] [place] [address] [place] [address] [phone]           Signed: Charlton Amor 10/31/2018, 7:42 AM Patient seen and examined by me on day of discharge. Maryla Morrow, MD, ABPMR

## 2018-10-31 NOTE — Progress Notes (Signed)
Physical Therapy Discharge Summary  Patient Details  Name: Cynthia Hunt MRN: [id number] Date of Birth: [birth year]  Today's Date: 10/31/2018 PT Individual Time: 1130-1225 PT Individual Time Calculation (min): 55 min   Pt in recliner and agreeable to therapy, no c/o pain. Ambulated around unit, mod/i, using RW, >150' at a time. Performed functional mobility as detailed below including bed mobility, gait, stair negotiation, and transfers. Additionally performed car transfer w/ supervision at SUV height and mod/i at sedan height.  Performed Berg Balance Scale and explained significance of results to pt including increased fall risk with a score <45. Discussed importance of carrying phone on her at all times in case she does fall, she is unable to attempt to get up from floor and verbalizes understanding that she will need to call EMS. Also discussed only going out in community w/ nephew at this time for safety as a community environment is more dynamic and unpredictable. Pt verbalized understanding and in agreement. Instructed on HEP w/ [place] exercises, minus stairs and LAQs. Pt performed correctly and safely w/ UE support on RW. Provided w/ written handout. Returned to room and ended session in recliner, all needs in reach.   Patient has met 9 of 10 long term goals due to improved activity tolerance, improved balance, improved postural control, ability to compensate for deficits and functional use of  left upper extremity and left lower extremity.  Patient to discharge at an ambulatory level Modified Independent.   Patient's care partner is independent to provide the necessary physical assistance at discharge. Pt's nephew to provide assist for community mobility and PRN assist in the home.   Reasons goals not met: Floor transfer goal not met as attempt at floor transfer remains unsafe at this time 2/2 global weakness and deconditioning that was present at baseline.   Recommendation:  Patient will  benefit from ongoing skilled PT services in home health setting to continue to advance safe functional mobility, address ongoing impairments in functional balance, global strength, and endurance, and minimize fall risk.  Equipment: RW  Reasons for discharge: treatment goals met and discharge from hospital  Patient/family agrees with progress made and goals achieved: Yes  PT Discharge Precautions/Restrictions Precautions Precautions: Fall Restrictions Weight Bearing Restrictions: No Pain Pain Assessment Pain Scale: 0-10 Pain Score: 0-No pain Faces Pain Scale: Hurts a little bit Pain Type: Acute pain Pain Location: Generalized Pain Descriptors / Indicators: Aching Pain Onset: Gradual Patients Stated Pain Goal: 0 Pain Intervention(s): Medication (See eMAR) Multiple Pain Sites: No Vision/Perception  Perception Perception: Within Functional Limits Praxis Praxis: Intact  Cognition Overall Cognitive Status: Within Functional Limits for tasks assessed Arousal/Alertness: Awake/alert Orientation Level: Oriented X4 Focused Attention: Appears intact Sustained Attention: Appears intact Memory: Appears intact Awareness: Appears intact Problem Solving: Appears intact Organizing: Appears intact Self Monitoring: Appears intact Safety/Judgment: Appears intact Sensation Sensation Light Touch: Appears Intact Coordination Gross Motor Movements are Fluid and Coordinated: No Heel Shin Test: Mild impairment L 2/2 weakness Motor  Motor Motor: Hemiplegia Motor - Discharge Observations: Mild L hemi remains  Mobility Bed Mobility Bed Mobility: Rolling Right;Rolling Left;Sit to Supine;Supine to Sit Rolling Right: Independent with assistive device Rolling Left: Independent with assistive device Supine to Sit: Independent with assistive device Sit to Supine: Independent with assistive device Transfers Transfers: Sit to Stand;Stand to Sit;Stand Pivot Transfers Sit to Stand: Independent  with assistive device Stand to Sit: Independent with assistive device Stand Pivot Transfers: Independent with assistive device Transfer (Assistive device): Rolling walker Locomotion  Gait Ambulation: Yes Gait Assistance: Independent with assistive device Gait Distance (Feet): 150 Feet Assistive device: Rolling walker Gait Gait: Yes Gait Pattern: Impaired Gait Pattern: Wide base of support;Poor foot clearance - left Gait velocity: decreased Stairs / Additional Locomotion Stairs: Yes Stairs Assistance: Independent with assistive device Stair Management Technique: Two rails Number of Stairs: 4 Height of Stairs: 6 Ramp: Independent with assistive device Curb: Independent with assistive device(RW) Wheelchair Mobility Wheelchair Mobility: No  Trunk/Postural Assessment  Cervical Assessment Cervical Assessment: Exceptions to WFL(ROM severely limited in all planes, per pt this is her baseline) Thoracic Assessment Thoracic Assessment: Exceptions to WFL(kyphotic) Lumbar Assessment Lumbar Assessment: Within Functional Limits Postural Control Postural Control: Within Functional Limits  Balance Balance Balance Assessed: Yes Standardized Balance Assessment Standardized Balance Assessment: Berg Balance Test Berg Balance Test Sit to Stand: Able to stand without using hands and stabilize independently Standing Unsupported: Able to stand safely 2 minutes Sitting with Back Unsupported but Feet Supported on Floor or Stool: Able to sit safely and securely 2 minutes Stand to Sit: Sits safely with minimal use of hands Transfers: Able to transfer safely, minor use of hands Standing Unsupported with Eyes Closed: Able to stand 10 seconds safely Standing Ubsupported with Feet Together: Able to place feet together independently but unable to hold for 30 seconds From Standing, Reach Forward with Outstretched Arm: Can reach forward >12 cm safely (5") From Standing Position, Pick up Object from  Floor: Able to pick up shoe, needs supervision From Standing Position, Turn to Look Behind Over each Shoulder: Turn sideways only but maintains balance Turn 360 Degrees: Able to turn 360 degrees safely but slowly Standing Unsupported, Alternately Place Feet on Step/Stool: Able to complete 4 steps without aid or supervision Standing Unsupported, One Foot in Front: Able to take small step independently and hold 30 seconds Standing on One Leg: Tries to lift leg/unable to hold 3 seconds but remains standing independently Total Score: 41 Static Sitting Balance Static Sitting - Balance Support: No upper extremity supported;Feet supported Static Sitting - Level of Assistance: 6: Modified independent (Device/Increase time) Dynamic Sitting Balance Dynamic Sitting - Balance Support: No upper extremity supported;During functional activity;Feet supported Dynamic Sitting - Level of Assistance: 6: Modified independent (Device/Increase time) Static Standing Balance Static Standing - Balance Support: During functional activity Static Standing - Level of Assistance: 6: Modified independent (Device/Increase time) Dynamic Standing Balance Dynamic Standing - Balance Support: During functional activity Dynamic Standing - Level of Assistance: 6: Modified independent (Device/Increase time) Extremity Assessment  RLE Assessment RLE Assessment: Within Functional Limits LLE Assessment LLE Assessment: Exceptions to [facility] Passive Range of Motion (PROM) Comments: [facility] General Strength Comments: 4- to 4/5 globally     K  10/31/2018, 1:12 PM

## 2018-10-31 NOTE — Discharge Instructions (Signed)
Inpatient Rehab Discharge Instructions  Cynthia Hunt Discharge date and time: No discharge date for patient encounter.   Activities/Precautions/ Functional Status: Activity: activity as tolerated Diet: diabetic diet Wound Care: none needed Functional status:  ___ No restrictions     ___ Walk up steps independently ___ 24/7 supervision/assistance   ___ Walk up steps with assistance ___ Intermittent supervision/assistance  ___ Bathe/dress independently ___ Walk with walker     _x__ Bathe/dress with assistance ___ Walk Independently    ___ Shower independently ___ Walk with assistance    ___ Shower with assistance ___ No alcohol     ___ Return to work/school ________     COMMUNITY REFERRALS UPON DISCHARGE:    Home Health:   PT     OT                      Agency:  [facility]      Phone: [phone]   Medical Equipment/Items Ordered: rolling walker, commode                                                     Agency/Supplier:  [facility] @ [phone]     Special Instructions: Aspirin and Plavix x3 months then aspirin alone STROKE/TIA DISCHARGE INSTRUCTIONS SMOKING Cigarette smoking nearly doubles your risk of having a stroke & is the single most alterable risk factor  If you smoke or have smoked in the last 12 months, you are advised to quit smoking for your health.  Most of the excess cardiovascular risk related to smoking disappears within a year of stopping.  Ask you doctor about anti-smoking medications  [place] Quit Line: 1-800-QUIT NOW  Free Smoking Cessation Classes [phone]  CHOLESTEROL Know your levels; limit fat & cholesterol in your diet  Lipid Panel     Component Value Date/Time   CHOL 136 10/23/2018 0604   TRIG 71 10/23/2018 0604   HDL 41 10/23/2018 0604   CHOLHDL 3.3 10/23/2018 0604   VLDL 14 10/23/2018 0604   LDLCALC 81 10/23/2018 0604      Many patients benefit from treatment even if their cholesterol is at goal.  Goal: Total  Cholesterol (CHOL) less than 160  Goal:  Triglycerides (TRIG) less than 150  Goal:  HDL greater than 40  Goal:  LDL (LDLCALC) less than 100   BLOOD PRESSURE American Stroke Association blood pressure target is less that 120/80 mm/Hg  Your discharge blood pressure is:  BP: (!) 195/88  Monitor your blood pressure  Limit your salt and alcohol intake  Many individuals will require more than one medication for high blood pressure  DIABETES (A1c is a blood sugar average for last 3 months) Goal HGBA1c is under 7% (HBGA1c is blood sugar average for last 3 months)  Diabetes:     Lab Results  Component Value Date   HGBA1C 9.0 (H) 10/23/2018     Your HGBA1c can be lowered with medications, healthy diet, and exercise.  Check your blood sugar as directed by your physician  Call your physician if you experience unexplained or low blood sugars.  PHYSICAL ACTIVITY/REHABILITATION Goal is 30 minutes at least 4 days per week  Activity: Increase activity slowly, Therapies: Physical Therapy: Home Health Return to work:   Activity decreases your risk of heart attack and stroke and  makes your heart stronger.  It helps control your weight and blood pressure; helps you relax and can improve your mood.  Participate in a regular exercise program.  Talk with your doctor about the best form of exercise for you (dancing, walking, swimming, cycling).  DIET/WEIGHT Goal is to maintain a healthy weight  Your discharge diet is:  Diet Order            Diet Carb Modified Fluid consistency: Thin; Room service appropriate? Yes  Diet effective now              liquids Your height is:  Height: 5\' 7"  (170.2 cm) Your current weight is: Weight: 93 kg Your Body Mass Index (BMI) is:  BMI (Calculated): 32.1  Following the type of diet specifically designed for you will help prevent another stroke.  Your goal weight range is:    Your goal Body Mass Index (BMI) is 19-24.  Healthy food habits can help reduce  3 risk factors for stroke:  High cholesterol, hypertension, and excess weight.  RESOURCES Stroke/Support Group:  Call [phone]   STROKE EDUCATION PROVIDED/REVIEWED AND GIVEN TO PATIENT Stroke warning signs and symptoms How to activate emergency medical system (call 911). Medications prescribed at discharge. Need for follow-up after discharge. Personal risk factors for stroke. Pneumonia vaccine given:  Flu vaccine given:  My questions have been answered, the writing is legible, and I understand these instructions.  I will adhere to these goals & educational materials that have been provided to me after my discharge from the hospital.      My questions have been answered and I understand these instructions. I will adhere to these goals and the provided educational materials after my discharge from the hospital.  Patient/Caregiver Signature _______________________________ Date __________  Clinician Signature _______________________________________ Date __________  Please bring this form and your medication list with you to all your follow-up doctor's appointments.

## 2018-10-31 NOTE — Progress Notes (Signed)
Physical Therapy Session Note  Patient Details  Name: Cynthia Hunt MRN: [id number] Date of Birth: Sep 05, 1945  Today's Date: 10/31/2018 PT Individual Time: 0917-1030 PT Individual Time Calculation (min): 73 min   Short Term Goals: Week 1:  PT Short Term Goal 1 (Week 1): =LTGs due to ELOS  Skilled Therapeutic Interventions/Progress Updates:    Pt supine in bed upon PT arrival, agreeable to therapy tx and denies pain. Pt transferred to sitting EOB with supervision and donned pants, sit<>stand with supervision to pull pants over hips. Pt ambulated to the gym with supervision and rollator x 150 ft. Pt transferred to the mat. Pt worked on LE strengthening to perform x 10 sit<>stands with CGA and without UE support. In supine pt performed 2 x 10 bridges for hip strengthening, cues for techniques. Pt worked on bed mobility to roll from supine<>prone with cues for techniques, pt with difficulty getting her arms underneath her, mod assist. In prone pt unable to press up onto elbow and she reports some pain with this. Pt transferred to long sitting on the mat for LE stretching, pt and therapist trying to problem solve steps for a floor transfer. Pt with LE tightness limiting her ability to get into positioning. Pt with difficulty ring sitting secondary to hip tightness. Pt transferred to sitting edge of mat with supervision. Pt worked on dynamic standing balance this session to ambulate without AD 2 x 85 ft, sidestepping 2 x 18ft without AD, backwards ambulation without AD x 40 ft. Pt ambulated to the dayroom with supervision x 150 ft using rollator and used nustep this session x 7 minutes for global strength and endurance on workload 5. Pt ambulated back to the gym and worked on ascending/descending steps, x 4 steps with B rails and supervision, x 1 step with single handrail and CGA, x 1 step with SPC and min assist (x4 trials). Pt ambulated back to room and left in recliner with needs in reach and chair alarm  set.   Therapy Documentation Precautions:  Precautions Precautions: Fall Restrictions Weight Bearing Restrictions: No   Therapy/Group: Individual Therapy  Cresenciano Genre, PT, DPT 10/31/2018, 7:52 AM

## 2018-10-31 NOTE — Progress Notes (Addendum)
[facility] PHYSICAL MEDICINE & REHABILITATION PROGRESS NOTE  Subjective/Complaints: Patient seen laying in bed this AM.  She states she slept well overnight.  She states she is excited about going home tomorrow.  She has questions regarding DME.  ROS: Denies CP, shortness of breath, nausea, vomiting, diarrhea.  Objective: Vital Signs: Blood pressure (!) 179/77, pulse 82, temperature 98.2 F (36.8 C), resp. rate 17, height 5\' 7"  (1.702 m), weight 93 kg, SpO2 98 %. No results found. No results for input(s): WBC, HGB, HCT, PLT in the last 72 hours. Recent Labs    10/30/18 0943  NA 137  K 3.8  CL 101  CO2 25  GLUCOSE 190*  BUN 24*  CREATININE 1.45*  CALCIUM 8.9    Physical Exam: BP (!) 179/77 (BP Location: Right Arm)   Pulse 82   Temp 98.2 F (36.8 C)   Resp 17   Ht 5\' 7"  (1.702 m)   Wt 93 kg   SpO2 98%   BMI 32.11 kg/m  Constitutional: No distress . Vital signs reviewed. HENT: Normocephalic. Atraumatic. Eyes: EOMI. No discharge. Cardiovascular: No JVD. Respiratory: Normal effort. GI: Non-distended. Musc: No edema or tenderness in extremities. Neurological: She is alert.  Patient alert and oriented  Fair awareness of deficits.  Makes good eye contact with examiner. Motor:  Left upper extremity: 4/5 proximal distal, stable Left lower extremity: 4-/5 proximal distal, stable Dysarthria, stable Right facial weakness unchanged Skin: Skin is warm and dry.  Psychiatric: She has a normal mood and affect. Her behavior is normal.    Assessment/Plan: 1. Functional deficits secondary to multifocal right MCA infarct which require 3+ hours per day of interdisciplinary therapy in a comprehensive inpatient rehab setting.  Physiatrist is providing close team supervision and 24 hour management of active medical problems listed below.  Physiatrist and rehab team continue to assess barriers to discharge/monitor patient progress toward functional and medical goals  Care  Tool:  Bathing    Body parts bathed by patient: Right arm, Left arm, Chest, Abdomen, Front perineal area, Buttocks, Right upper leg, Left upper leg, Right lower leg, Left lower leg, Face         Bathing assist Assist Level: Contact Guard/Touching assist     Upper Body Dressing/Undressing Upper body dressing   What is the patient wearing?: Pull over shirt    Upper body assist Assist Level: Supervision/Verbal cueing    Lower Body Dressing/Undressing Lower body dressing      What is the patient wearing?: Underwear/pull up     Lower body assist Assist for lower body dressing: Contact Guard/Touching assist     Toileting Toileting    Toileting assist Assist for toileting: Supervision/Verbal cueing     Transfers Chair/bed transfer  Transfers assist     Chair/bed transfer assist level: Supervision/Verbal cueing     Locomotion Ambulation   Ambulation assist      Assist level: Supervision/Verbal cueing Assistive device: No Device Max distance: 150'   Walk 10 feet activity   Assist     Assist level: Supervision/Verbal cueing Assistive device: No Device   Walk 50 feet activity   Assist    Assist level: Supervision/Verbal cueing Assistive device: No Device    Walk 150 feet activity   Assist    Assist level: Supervision/Verbal cueing Assistive device: No Device    Walk 10 feet on uneven surface  activity   Assist           Wheelchair  Assist Will patient use wheelchair at discharge?: No   Wheelchair activity did not occur: N/A         Wheelchair 50 feet with 2 turns activity    Assist    Wheelchair 50 feet with 2 turns activity did not occur: N/A       Wheelchair 150 feet activity     Assist Wheelchair 150 feet activity did not occur: N/A          Medical Problem List and Plan: 1.  Left sided weakness secondary to right MCA cortical and subcortical infarct on 10/22/2018.             Continue  CIR  Plan for d/c tomorrow  Will see patient for transitional care management in 1-2 weeks post-discharge 2.  Antithrombotics: -DVT/anticoagulation:  Lovenox             -antiplatelet therapy: aspirin 81 mg daily and Plavix 75 mg daily 3 weeks then aspirin alone 3. Pain Management: Robaxin 500 mg daily 4. Mood:  Trazodone 100 mg daily at bedtime, Xanax 0.5 mg daily as needed             -antipsychotic agents: N/A 5. Neuropsych: This patient is capable of making decisions on her own behalf. 6. Skin/Wound Care:  Routine skin checks 7. Fluids/Electrolytes/Nutrition:  Routine in and out's  8. Hypertension. Patient on Cozaar 100 mg daily, HCTZ 25 mg daily prior to admission. Resume as needed.    Cozaar increased to 100 on 5/4  HCTZ 12.5 started on 5/1, increased to 25 on 5/3  Hydralazine as needed started on 5/3  Improving on 5/5  Monitor with increased mobility 9. Diabetes mellitus peripheral neuropathy. Hemoglobin A1c 9.0.Lantus insulin 30 units daily. Check blood sugars before meals and at bedtime.    NovoLog 2 units 3 times daily started on 5/4  Improving on 5/5  Monitor with increased mobility 10. History of tobacco use 12 years ago as well as marijuana use. Urine drug screen not performed on this latest admission. 11. Obesity. BMI 32.11. Dietary follow-up 12. Hyperlipidemia. Lipitor 13.  Hypokalemia:   Potassium 3.8 on 5/4 14.  Acute blood loss anemia  Hemoglobin 10.1 on 4/30  Continue to monitor 15.  CKD stage III  Creatinine 1.45 on 5/4  Continue to monitor 16.  Slow transit constipation  Bowel meds increased on 5/2  Improving overall  LOS: 6 days A FACE TO FACE EVALUATION WAS PERFORMED  Ankit Karis Juba 10/31/2018, 9:01 AM

## 2018-10-31 NOTE — Progress Notes (Signed)
Occupational Therapy Discharge Summary  Patient Details  Name: Cynthia Hunt MRN: [id number] Date of Birth: Feb 04, 1946  Today's Date: 10/31/2018 OT Individual Time: 2585-2778 OT Individual Time Calculation (min): 70 min    OT treatment session focused on increased independence with BADL tasks. Pt able to access dresser drawers, collect clothing, ambulate to bathroom with RW all without assist from OT. Pt voided bladder on commode and completed 3/3 toileting steps independently.  Bathing/dressing completed mod I with increased time. Discussed home bathroom set-up and set BSC in simulated shower as she would have it at home. Practiced stepping over small ledge to access shower. Pt ambulated back to room RW and mod I and left seated in recliner with needs met.    Patient has met 10 of 10 long term goals due to improved activity tolerance, improved balance, postural control, ability to compensate for deficits, functional use of  LEFT upper and LEFT lower extremity and improved coordination.  Patient to discharge at overall Modified Independent level.   Reasons goals not met: n/a  Recommendation:  Patient will benefit from ongoing skilled OT services in home health setting to continue to advance functional skills in the area of BADL.  Equipment: RW, 3-in-1 BSC  Reasons for discharge: treatment goals met and discharge from hospital  Patient/family agrees with progress made and goals achieved: Yes  OT Discharge Precautions/Restrictions  Precautions Precautions: Fall Restrictions Weight Bearing Restrictions: No Pain Pain Assessment Pain Scale: 0-10 Pain Score: 0-No pain ADL ADL Eating: Independent Grooming: Independent Upper Body Bathing: Independent Lower Body Bathing: Independent Upper Body Dressing: Independent Lower Body Dressing: Independent Toileting: Independent Toilet Transfer: Modified independent Toilet Transfer Method: Magazine features editor: Modified  independent Social research officer, government Method: Print production planner with back Vision Baseline Vision/History: Wears glasses Wears Glasses: Reading only Patient Visual Report: No change from baseline Vision Assessment?: No apparent visual deficits Eye Alignment: Within Functional Limits Perception  Perception: Within Functional Limits Praxis Praxis: Intact Cognition Overall Cognitive Status: Within Functional Limits for tasks assessed Arousal/Alertness: Awake/alert Orientation Level: Oriented X4 Focused Attention: Appears intact Sustained Attention: Appears intact Memory: Appears intact Awareness: Appears intact Problem Solving: Appears intact Organizing: Appears intact Self Monitoring: Appears intact Safety/Judgment: Appears intact Sensation Sensation Light Touch: Appears Intact Coordination Gross Motor Movements are Fluid and Coordinated: No Fine Motor Movements are Fluid and Coordinated: Yes Coordination and Movement Description: L UE coordination improved since eval Heel Shin Test: Mild impairment L 2/2 weakness Motor  Motor Motor: Hemiplegia Motor - Discharge Observations: Mild L hemi remains Mobility  Supine to Sit: Independent with assistive device Sit to Supine: Independent with assistive device Transfers Sit to Stand: Independent with assistive device Stand to Sit: Independent with assistive device  Trunk/Postural Assessment  Cervical Assessment Cervical Assessment: Exceptions to WFL(ROM severely limited in all planes, per pt this is her baseline) Thoracic Assessment Thoracic Assessment: Exceptions to WFL(kyphotic) Lumbar Assessment Lumbar Assessment: Within Functional Limits Postural Control Postural Control: Within Functional Limits  Balance Balance Sttatic Sitting Balance Static Sitting - Balance Support: No upper extremity supported;Feet supported Static Sitting - Level of Assistance: 6: Modified independent (Device/Increase  time) Dynamic Sitting Balance Dynamic Sitting - Balance Support: No upper extremity supported;During functional activity;Feet supported Dynamic Sitting - Level of Assistance: 6: Modified independent (Device/Increase time) Static Standing Balance Static Standing - Balance Support: During functional activity Static Standing - Level of Assistance: 6: Modified independent (Device/Increase time) Dynamic Standing Balance Dynamic Standing - Balance Support: During functional activity  Dynamic Standing - Level of Assistance: 6: Modified independent (Device/Increase time) Extremity/Trunk Assessment RUE Assessment RUE Assessment: Within Functional Limits LUE Assessment LUE Assessment: Within Functional Limits(still very mild hemiplegia, able to use functionally) LUE Body System: Neuro Brunstrum levels for arm and hand: Arm;Hand Brunstrum level for arm: Stage V Relative Independence from Synergy Brunstrum level for hand: Stage VI Isolated joint movements LUE Strength LUE Overall Strength Comments: 4/5 overall Left Shoulder Flexion: 4/5   Daneen Schick Sterling Mondo 10/31/2018, 2:42 PM

## 2018-11-01 ENCOUNTER — Other Ambulatory Visit: Payer: Self-pay

## 2018-11-01 DIAGNOSIS — R0989 Other specified symptoms and signs involving the circulatory and respiratory systems: Secondary | ICD-10-CM

## 2018-11-01 LAB — GLUCOSE, CAPILLARY: Glucose-Capillary: 110 mg/dL — ABNORMAL HIGH (ref 70–99)

## 2018-11-01 LAB — CREATININE, SERUM
Creatinine, Ser: 1.4 mg/dL — ABNORMAL HIGH (ref 0.44–1.00)
GFR calc Af Amer: 43 mL/min — ABNORMAL LOW (ref >60)
GFR calc non Af Amer: 37 mL/min — ABNORMAL LOW (ref >60)

## 2018-11-01 MED ORDER — FREESTYLE LANCETS MISC: 12 refills | Status: AC

## 2018-11-01 MED ORDER — TRAZODONE HCL 100 MG PO TABS: 100.0000 mg | ORAL_TABLET | Freq: Every day | ORAL | 0 refills | Status: AC

## 2018-11-01 MED ORDER — ACETAMINOPHEN 325 MG PO TABS: 650.0000 mg | ORAL_TABLET | ORAL | Status: DC | PRN

## 2018-11-01 MED ORDER — CLOPIDOGREL BISULFATE 75 MG PO TABS: 75.0000 mg | ORAL_TABLET | Freq: Every day | ORAL | 0 refills | Status: AC

## 2018-11-01 MED ORDER — METHOCARBAMOL 500 MG PO TABS: 500.0000 mg | ORAL_TABLET | Freq: Every day | ORAL | 0 refills | Status: DC

## 2018-11-01 MED ORDER — INSULIN GLARGINE 100 UNITS/ML SOLOSTAR PEN: 30.0000 [IU] | PEN_INJECTOR | Freq: Every day | SUBCUTANEOUS | 11 refills | Status: DC

## 2018-11-01 MED ORDER — HYDROCHLOROTHIAZIDE 25 MG PO TABS: 25.0000 mg | ORAL_TABLET | Freq: Every day | ORAL | 1 refills | Status: DC

## 2018-11-01 MED ORDER — INSULIN GLARGINE 100 UNIT/ML SOLOSTAR PEN: 30.0000 [IU] | PEN_INJECTOR | Freq: Every day | SUBCUTANEOUS | 11 refills | Status: DC

## 2018-11-01 MED ORDER — ALPRAZOLAM 0.5 MG PO TABS: 0.5000 mg | ORAL_TABLET | Freq: Every day | ORAL | 0 refills | Status: DC | PRN

## 2018-11-01 MED ORDER — LOSARTAN POTASSIUM 100 MG PO TABS: 100.0000 mg | ORAL_TABLET | Freq: Every day | ORAL | 1 refills | Status: AC

## 2018-11-01 MED ORDER — ATORVASTATIN CALCIUM 40 MG PO TABS: 40.0000 mg | ORAL_TABLET | Freq: Every day | ORAL | 0 refills | Status: AC

## 2018-11-01 NOTE — Progress Notes (Signed)
Patient discharged to family, no complications noted at this time. All belongings sent with patient.  Jay Schlichter, LPN

## 2018-11-01 NOTE — Progress Notes (Addendum)
Social Work  Discharge Note  The overall goal for the admission was met for:   Discharge location: Yes - pt to return to her home with intermittent assistance of family  Length of Stay: Yes - 7 days  Discharge activity level: Yes - modified independent  Home/community participation: Yes  Services provided included: MD, RD, PT, OT, SLP, RN, TR, Pharmacy, Neuropsych and [place]: Private Insurance: [employer]  Follow-up services arranged: Home Health: PT, OT via [facility], DME: rolling walker, commode via [place] and Patient/Family has no preference for HH/DME agencies  Comments (or additional information):     Contact info:  Pt at [phone]       Adriana Mccallum @ [phone]  Patient/Family verbalized understanding of follow-up arrangements: Yes  Individual responsible for coordination of the follow-up plan: pt;  Have alerted [facility] CM of pt's d/c and they will follow up with pt as well.  Pt reporting that the goddaughter, who had been living with her PTA, is moving out of the home.  Confirmed correct DME delivered: Lennart Pall 11/01/2018    Kyliah Deanda

## 2018-11-01 NOTE — Progress Notes (Addendum)
Please see discharge summary as well.  Blood pressures remain labile, but overall appear to be improving.  CBGs remain labile but overall improved.  Creatinine stable.  Will need ambulatory monitoring and adjustments for both.  Patient stable for discharge today.

## 2018-11-01 NOTE — Patient Outreach (Signed)
  [facility]) Care Management Chronic Special Needs Program   11/01/2018  Name: Cynthia Hunt, DOB: 06-24-1946  MRN: [id number]  The client was discussed in today's interdisciplinary care team meeting.  The following issues were discussed:  Client's needs, Changes in health status, Care Plan, Coordination of care, Care transitions and Issues/barriers to care  Participants present:  Jodean Lima, MSN, RN, CCM, CNS    Kathyrn Sheriff, MSN, RN, CCM   Melissa Sandlin RN,BSN,CCM, CDE     Abner Greenspan, MD Jacob Moores, MD   Recommendations:  Follow up with client post discharge for needs, provide education as indicated  Plan:  Follow up with client post discharge for needs  Follow-up:  Post discharge  Dudley Major RN, [facility], CDE Chronic Care Management Coordinator [facility] Care Management [phone]

## 2018-11-01 NOTE — Patient Care Conference (Signed)
Inpatient RehabilitationTeam Conference and Plan of Care Update Date: 11/01/2018   Time: 1:51 PM    Patient Name: Cynthia Hunt      Medical Record Number: [id number]  Date of Birth: 1946/03/11 Sex: Female         Room/Bed: 4M03C/4M03C-01 Payor Info: Payor: [employer] ADVANTAGE / Plan: [employer] Must HMO / Product Type: *No Product type* /    Admitting Diagnosis: CVA  Admit Date/Time:  10/25/2018  2:29 PM Admission Comments: No comment available   Primary Diagnosis:  <principal problem not specified> Principal Problem: <principal problem not specified>  Patient Active Problem List   Diagnosis Date Noted  . Benign essential HTN   . Slow transit constipation   . Labile blood glucose   . CKD (chronic kidney disease), stage III ([place])   . Acute blood loss anemia   . Hypertensive crisis   . 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 Hypokalemia   . Class 1 obesity due to excess calories with serious comorbidity and body mass index (BMI) of 32.0 to 32.9 in adult   . Poorly controlled type 2 diabetes mellitus with peripheral neuropathy ([place])   . Acute ischemic stroke ([place]) 10/22/2018  . Essential hypertension 10/22/2018  . Dyslipidemia 10/22/2018  . Uncontrolled type 2 diabetes mellitus without complication, with long-term current use of insulin ([place]) 02/13/2016    Expected Discharge Date: Expected Discharge Date: 11/01/18  Team Members Present: Physician leading conference: Dr. Delice Lesch Social Worker Present: Lennart Pall, LCSW Nurse Present: Frances Maywood, RN PT Present: Burnard Bunting, PT OT Present: Mariane Masters, OT SLP Present: Weston Anna, SLP PPS Coordinator present : Gunnar Fusi     Current Status/Progress Goal Weekly Team Focus  Medical   Left sided weakness secondary to right MCA cortical and subcortical infarct on 10/22/2018.  Improve mobility, endurance, BP, CKD,DM  See above   Bowel/Bladder   WNL- bm 5/4         Swallow/Nutrition/  Hydration             ADL's   Supervision/ mod I  Mod I  dc planning, activity tolerance, pt/family education, self-care retraining   Mobility   Mod I   Mod I household gait  d/c planning, endurance, balance   Communication             Safety/Cognition/ Behavioral Observations            Pain   none         Skin   WNL           Rehab Goals Patient on target to meet rehab goals: Yes *See Care Plan and progress notes for long and short-term goals.     Barriers to Discharge  Current Status/Progress Possible Resolutions Date Resolved   Physician    Medical stability     See above  Therapies, optimzie HTN/DM meds, follow labs      Nursing                  PT                    OT                  SLP                SW                Discharge Planning/Teaching Needs:  Pt to return home  with intermittent assist of friends and family.  nA - mod ind goals.   Team Discussion:  Pt has met mod ind goals and ready for d/c today.  Revisions to Treatment Plan:  NA    Continued Need for Acute Rehabilitation Level of Care: The patient requires daily medical management by a physician with specialized training in physical medicine and rehabilitation for the following conditions: Daily direction of a multidisciplinary physical rehabilitation program to ensure safe treatment while eliciting the highest outcome that is of practical value to the patient.: Yes Daily medical management of patient stability for increased activity during participation in an intensive rehabilitation regime.: Yes Daily analysis of laboratory values and/or radiology reports with any subsequent need for medication adjustment of medical intervention for : Neurological problems;Diabetes problems;Blood pressure problems;Renal problems   I attest that I was present, lead the team conference, and concur with the assessment and plan of the team.   Eldar Robitaille 11/01/2018, 1:51 PM   Team conference was held  via web/ teleconference due to [place] - 22

## 2018-11-02 ENCOUNTER — Other Ambulatory Visit: Payer: Self-pay

## 2018-11-02 NOTE — Patient Outreach (Addendum)
  [facility]) Care Management Chronic Special Needs Program  11/02/2018  Name: Cynthia Hunt DOB: May 23, 1946  MRN: [id number]  [name] is enrolled in a chronic special needs plan for Diabetes. Reviewed and updated care plan.  Client was admitted to [facility] on 10/22/18 with dx of CVA.  Client discharged from inpatient and admitted to inpatient rehab at [facility] on 10/25/18.  Discharged from inpatient rehab on 11/01/18 to home Goals Addressed            This Visit's Progress   . Client will not report change from baseline and no repeated symptoms of stroke with in the next 6 months       . Client will report no fall or injuries in the next 6 months.      . General - Client will not be readmitted within 30 days (C-SNP)         Plan:  Send successful outreach letter with a copy of their individualized care plan, Send individual care plan to provider and Send educational material stroke Chronic care management coordinator will outreach in: 1-2 weeks.     Dudley Major RN, [facility]oss, CDE Chronic Care Management Coordinator [facility] Care Management [phone]

## 2018-11-03 ENCOUNTER — Telehealth: Payer: Self-pay | Admitting: *Deleted

## 2018-11-03 DIAGNOSIS — F329 Major depressive disorder, single episode, unspecified: Secondary | ICD-10-CM | POA: Diagnosis not present

## 2018-11-03 DIAGNOSIS — N183 Chronic kidney disease, stage 3 (moderate): Secondary | ICD-10-CM | POA: Diagnosis not present

## 2018-11-03 DIAGNOSIS — E1142 Type 2 diabetes mellitus with diabetic polyneuropathy: Secondary | ICD-10-CM | POA: Diagnosis not present

## 2018-11-03 DIAGNOSIS — F419 Anxiety disorder, unspecified: Secondary | ICD-10-CM | POA: Diagnosis not present

## 2018-11-03 DIAGNOSIS — F319 Bipolar disorder, unspecified: Secondary | ICD-10-CM | POA: Diagnosis not present

## 2018-11-03 DIAGNOSIS — E785 Hyperlipidemia, unspecified: Secondary | ICD-10-CM | POA: Diagnosis not present

## 2018-11-03 DIAGNOSIS — M791 Myalgia, unspecified site: Secondary | ICD-10-CM | POA: Diagnosis not present

## 2018-11-03 DIAGNOSIS — I69354 Hemiplegia and hemiparesis following cerebral infarction affecting left non-dominant side: Secondary | ICD-10-CM | POA: Diagnosis not present

## 2018-11-03 DIAGNOSIS — E669 Obesity, unspecified: Secondary | ICD-10-CM | POA: Diagnosis not present

## 2018-11-03 DIAGNOSIS — Z6832 Body mass index (BMI) 32.0-32.9, adult: Secondary | ICD-10-CM | POA: Diagnosis not present

## 2018-11-03 DIAGNOSIS — F1290 Cannabis use, unspecified, uncomplicated: Secondary | ICD-10-CM | POA: Diagnosis not present

## 2018-11-03 DIAGNOSIS — Z794 Long term (current) use of insulin: Secondary | ICD-10-CM | POA: Diagnosis not present

## 2018-11-03 DIAGNOSIS — Z87891 Personal history of nicotine dependence: Secondary | ICD-10-CM | POA: Diagnosis not present

## 2018-11-03 DIAGNOSIS — I129 Hypertensive chronic kidney disease with stage 1 through stage 4 chronic kidney disease, or unspecified chronic kidney disease: Secondary | ICD-10-CM | POA: Diagnosis not present

## 2018-11-03 DIAGNOSIS — E1122 Type 2 diabetes mellitus with diabetic chronic kidney disease: Secondary | ICD-10-CM | POA: Diagnosis not present

## 2018-11-03 NOTE — Telephone Encounter (Signed)
Transitional Care call-I spoke with Mrs Buckwalter    1. Are you/is patient experiencing any problems since coming home? Are there any questions regarding any aspect of care? NO 2. Are there any questions regarding medications administration/dosing? Are meds being taken as prescribed? Patient should review meds with caller to confirm  SHE DOES NOT HAVE HER PLAVIX OR ATORVASTATIN, I HAVE CONFIRMED THE PHARMACY DOES HAVE IT AND SHE WILL PICK IT UP 3. Have there been any falls? NO 4. Has Home Health been to the house and/or have they contacted you? If not, have you tried to contact them? Can we help you contact them? YES THEY HAVE BEEN OUT 5. Are bowels and bladder emptying properly? Are there any unexpected incontinence issues? If applicable, is patient following bowel/bladder programs?NO-SOME CONSTIPATION BUT HAS HER MIRALAX 6. Any fevers, problems with breathing, unexpected pain? NO 7. Are there any skin problems or new areas of breakdown? NO 8. Has the patient/family member arranged specialty MD follow up (ie cardiology/neurology/renal/surgical/etc)?  Can we help arrange? APPT GIVEN FOR RETURN TO SEE DR PATEL 9. Does the patient need any other services or support that we can help arrange? NO 10. Are caregivers following through as expected in assisting the patient? YES 11. Has the patient quit smoking, drinking alcohol, or using drugs as recommended? N/A  Appointment time Wednesday 11/15/18 arrive by 9:00 for 9:20 appointment with Dr Allena Katz REMINDED TO LOOK FOR PACKET ARRIVING IN MAIL [address] [phone]

## 2018-11-07 ENCOUNTER — Other Ambulatory Visit: Payer: Self-pay

## 2018-11-07 NOTE — Patient Outreach (Signed)
  [facility]) Care Management Chronic Special Needs Program  11/07/2018  Name: Cynthia Hunt DOB: [birth year]  MRN: [id number]  Ms. Joni Hovell is enrolled in a chronic special needs plan for Diabetes. Client called to follow up from discharge from inpatient rehab  No answer and HIPAA compliant message left. Plan for 2nd outreach call 2-3 days Chronic care management coordinator will attempt outreach in 2-3 days.   Dudley Major RN, [facility], CDE Chronic Care Management Coordinator [facility] Care Management [phone]

## 2018-11-09 ENCOUNTER — Other Ambulatory Visit: Payer: Self-pay

## 2018-11-09 NOTE — Patient Outreach (Signed)
[facility]) Care Management  11/09/2018  Cynthia Hunt 1946-02-03 [id number]   Successful outreach to patient regarding social work referral.  Referral stated:  "CSNP client with recent stroke. She lives alone with very little family support. Currently unable to drive and unsure of transportation to appt. Please assist with resources with IADL help, transportation, and Advanced Directives." Patient reports having upcoming appointment at [facility] on 11/13/18, however, she has transportation to this appointment.  BSW talked with patient about SCAT services and she agreed to application being submitted.  Patient has appointment on 11/15/18 with Dr. Maryla Morrow.  Patient is unsure if she will have transportation to this appointment.  BSW agreed to call her next week prior to the appointment to determine if she found transportation.  If not, BSW will arrange transportation through [facility] contract with [facility] since SCAT eligibility will not be determined by that time.  BSW discussed Advance Directives and agreed to mail EMMI and packet.  BSW will follow up with patient within the next two weeks regarding status of SCAT services and  to ensure receipt of Advance Directives   Malachy Chamber, BSW Social Worker [phone]

## 2018-11-09 NOTE — Patient Outreach (Signed)
[facility] Care Management Chronic Special Needs Program  11/09/2018  Name: Cynthia Hunt DOB: 06-28-1946  MRN: [id number]  Ms. Luanne BrasSheila Sykora is enrolled in a chronic special needs plan for Diabetes. Chronic Care Management Coordinator telephoned client to review health risk assessment and to develop individualized care plan.  Introduced the chronic care management program, importance of client participation, and taking their care plan to all provider appointments and inpatient facilities.  Reviewed the transition of care process and possible referral to community care management.  Subjective: Client states that her Aunt from [place] is helping her for a while as she recovers. States her Goddaughter no loner is living with her and she is very relieved.   States she is able to bath and dress herself but she is slow.  States that she is getting physical therapy and she is to have therapy today.  States she spoke with her doctors office yesterday and she has an appt on Monday.  States she can not drive yet and thinks one of her family members will take her if needed but she is concerned about transportation.  States her [employer] has not been working.  States she did call the company and they sent her another reader but it is not working either.  States she does not want a glucometer as she does not want to stick her fingers.    Goals Addressed            This Visit's Progress   .  Acknowledge receipt of Medical illustratorAdvanced Directive package      . Advanced Care Planning complete by next 9 months       [facility] social worker will contact you     . Client understands the importance of follow-up with providers by attending scheduled visits   On track   . Client will have ADL/IADL needs(transportation) addressed by next 3 months       [facility] social worker will contact you    . Client will not report change from baseline and no repeated symptoms  of stroke with in the next 6 months    On track   . Client will report no fall or injuries in the next 6 months.   On track   . Client will verbalize knowledge of self management of Hypertension as evidences by BP reading of 140/90 or less; or as defined by provider   On track   . General - Client will not be readmitted within 30 days (C-SNP)   On track   . HEMOGLOBIN A1C < 7.0       Diabetes self management actions:  Glucose monitoring per provider recommendations  Eat Healthy  Check feet daily  Visit provider every 3-6 months as directed  Hbg A1C level every 3-6 months.  Eye Exam yearly    . Maintain timely refills of diabetic medication as prescribed within the year .   On track   . Obtain annual  Lipid Profile, LDL-C   On track   . Obtain Annual Eye (retinal)  Exam    On track   . Obtain Annual Foot Exam   On track   . Obtain annual screen for micro albuminuria (urine) , nephropathy (kidney problems)   On track   . Obtain Hemoglobin A1C at least 2 times per year   On track   . Visit Primary Care Provider or Endocrinologist at least 2 times per year    On  track    Client is not meeting diabetes self management goal of hemoglobin A1C of <7% with last reading of 9%.  Client currently receiving home health services from [facility] for physical therapy.  Client lives alone and reports poor family support.  Agreeable to referral to Social Work for resources with IADL help, transportation, and [facility].  Client agreeable to referral to pharmacy for polypharmacy and assistance with cost  Plan:  Send successful outreach letter with a copy of their individualized care plan, Send individual care plan to provider  Chronic care management coordination will outreach in: 1-2 weeks to follow up with client.  Will refer client to:  Social Work and Pharmacy   Dudley Major RN, [place], CDE Chronic Care Producer, television/film/video Care Management [phone]

## 2018-11-13 ENCOUNTER — Other Ambulatory Visit: Payer: Self-pay | Admitting: Pharmacist

## 2018-11-13 DIAGNOSIS — I10 Essential (primary) hypertension: Secondary | ICD-10-CM | POA: Diagnosis not present

## 2018-11-13 DIAGNOSIS — I69354 Hemiplegia and hemiparesis following cerebral infarction affecting left non-dominant side: Secondary | ICD-10-CM | POA: Diagnosis not present

## 2018-11-13 DIAGNOSIS — Z794 Long term (current) use of insulin: Secondary | ICD-10-CM | POA: Diagnosis not present

## 2018-11-13 DIAGNOSIS — E1165 Type 2 diabetes mellitus with hyperglycemia: Secondary | ICD-10-CM | POA: Diagnosis not present

## 2018-11-13 DIAGNOSIS — Z8673 Personal history of transient ischemic attack (TIA), and cerebral infarction without residual deficits: Secondary | ICD-10-CM | POA: Diagnosis not present

## 2018-11-13 DIAGNOSIS — Z79899 Other long term (current) drug therapy: Secondary | ICD-10-CM | POA: Diagnosis not present

## 2018-11-13 NOTE — Patient Outreach (Addendum)
[facility]) Care Management  [facility]   11/13/2018  Cynthia Hunt [birth year] [id number] metho Metformin  Reason for referral: Medication Review and possible medication assistance -per notes, glucometer not working  Referral source: [employer] Care Manager with [facility] Current insurance: [employer] C-[employer]  PMHx includes but not limited to:  Recent hospitalization for acute ischemic stroke 4/26-4/29/20,then had inpatient rehab through 11/01/2018.  PMHx also includes T2DM, HTN, HLD, depression, obesity, CKD-III.   Outreach:  Successful telephone call with [name].  HIPAA identifiers verified.   Subjective:  Patient reports she is self-managing her medications.  She reports she had televisit with PCP Dr. Docia Chuck today who reviewed all her medications with her.  She reports she was told to stop simvastatin and meloxicam.  Both of these were noted to have been discontinued on AVS form 10/25/2018 as well.   Patient had difficulty reading labels from bottles and states she cannot see well.  She is confused on what medications she should be taking.    Objective: Lab Results  Component Value Date   CREATININE 1.40 (H) 11/01/2018   CREATININE 1.45 (H) 10/30/2018   CREATININE 1.24 (H) 10/26/2018    Lab Results  Component Value Date   HGBA1C 9.0 (H) 10/23/2018    Lipid Panel     Component Value Date/Time   CHOL 136 10/23/2018 0604   TRIG 71 10/23/2018 0604   HDL 41 10/23/2018 0604   CHOLHDL 3.3 10/23/2018 0604   VLDL 14 10/23/2018 0604   LDLCALC 81 10/23/2018 0604    BP Readings from Last 3 Encounters:  11/01/18 (!) 162/73  10/25/18 (!) 170/69  09/19/18 134/64    Allergies  Allergen Reactions  . Penicillins Other (See Comments)    Childhood Allergy Did it involve swelling of the face/tongue/throat, SOB, or low BP? Unknown Did it involve sudden or severe rash/hives, skin peeling, or any reaction on the inside of your  mouth or nose? Unknown Did you need to seek medical attention at a hospital or doctor's office? Unknown When did it last happen?childhood If all above answers are "NO", may proceed with cephalosporin use.     Medications Reviewed Today    Reviewed by Claudette Stapler, CPhT (Pharmacy Technician) on 10/25/18 at 1449  Med List Status: Complete  Medication Order Taking? Sig Documenting Provider Last Dose Status Informant  acetaminophen (TYLENOL) 500 MG tablet [id number]  Take 1,000 mg by mouth daily. [provider]  Active Self  Alcohol Swabs (ALCOHOL PREP) 70 % PADS [id number]   [provider]  Active Other           Med Note Veneta Penton   Tue Sep 19, 2018  7:05 PM)    ALPRAZolam Prudy Feeler) 0.5 MG tablet [id number]  Take 0.5 mg by mouth daily as needed (anxiety while driving).  [provider]  Active Multiple Informants           Med Note Felecia Shelling Oct 22, 2018  5:04 PM) #30 filled 08/08/18 [facility] per PMP AWARE  aspirin EC 81 MG EC tablet [id number]  Take 1 tablet (81 mg total) by mouth daily. Zannie Cove, MD  Active   atorvastatin (LIPITOR) 40 MG tablet [id number]  Take 1 tablet (40 mg total) by mouth daily at 6 PM. Zannie Cove, MD  Active   Biotin w/ Vitamins C & E (HAIR SKIN & NAILS GUMMIES PO) [id number]  Take 1 tablet by  mouth daily. [provider]  Active Self  Blood Glucose Calibration (FREESTYLE CONTROL SOLUTION) LIQD [id number]   [provider]  Active Other           Med Note Veneta Penton   Tue Sep 19, 2018  7:05 PM)    clopidogrel (PLAVIX) 75 MG tablet [id number]  Take 1 tablet (75 mg total) by mouth daily for 21 days. Zannie Cove, MD  Active   diphenhydrAMINE (BENADRYL) 25 MG tablet [id number]  Take 25 mg by mouth 3 (three) times daily.  [provider]  Active Multiple Informants  fluticasone (FLONASE) 50 MCG/ACT nasal spray [id number]  Place 1 spray into both nostrils daily as needed for allergies or  rhinitis. [provider]  Active   FREESTYLE LITE test strip [id number]   [provider]  Active Other           Med Note Veneta Penton   Tue Sep 19, 2018  7:05 PM)    Insulin Glargine [facility]) 100 UNIT/ML SOPN [id number]  Inject 35 Units into the skin at bedtime.  [provider]  Active Multiple Informants           Med Note Vickey Sages, HEATHER L   Sun Oct 22, 2018  8:26 PM) 3 pens (9 ml) filled 09/18/18 [facility] - pt thought she was still on Lantus but pharmacy records states Lantus last filled a year ago.  Lancets (FREESTYLE) lancets [id number]   [provider]  Active Other           Med Note Cleon Dew D   Tue Sep 19, 2018  7:06 PM)    losartan (COZAAR) 100 MG tablet [id number]  Take 100 mg by mouth daily.  [provider]  Active Multiple Informants           Med Note Vickey Sages, Jeanene Erb Oct 22, 2018  5:44 PM) #90 filled 08/29/18 [facility]  metFORMIN (GLUCOPHAGE) 1000 MG tablet [id number]  Take 1,000 mg by mouth 2 (two) times daily with a meal.  [provider]  Active Self           Med Note Vickey Sages, HEATHER L   Sun Oct 22, 2018  8:30 PM) LF 10/19/17 [facility] - bottle pt showed me is dated 04/10/16  methocarbamol (ROBAXIN) 500 MG tablet [id number]  Take 500 mg by mouth 2 (two) times daily as needed for muscle spasms. Script states "not meant for daily use" [provider]  Active Pharmacy Records           Med Note Felecia Shelling Oct 22, 2018  6:05 PM) LF 04/10/18 [facility]- unknown quantity  Multiple Vitamin (MULTIVITAMIN WITH MINERALS) TABS tablet [id number]  Take 1 tablet by mouth daily. [provider]  Active Self  OVER THE COUNTER MEDICATION [id number]  Apply 1 application topically daily. Over the counter pain cream "Hemma" sp? [provider]  Active Self  polyethylene glycol (MIRALAX / GLYCOLAX) 17 g packet [id number]  Take 17 g by mouth daily as needed. Zannie Cove, MD  Active    traZODone (DESYREL) 100 MG tablet [id number]  Take 100 mg by mouth at bedtime.  [provider]  Active Multiple Informants           Med Note Felecia Shelling Oct 22, 2018  6:06 PM) #90 filled 10/17/18 [facility]         ASSESSMENT: Date Discharged from Hospital: 11/01/2018  Date Medication Reconciliation Performed: 11/14/2018  Medications:  New at Discharge: . Hydrochlorothiazide  Adjustments at Discharge: . Acetaminophen . Insulin Glargine . Methocarbamol  Discontinued at Discharge:   Diphenhydramine  Fluticasone NS   Metformin  OTC medication  Patient was recently discharged from hospital and all medications have been reviewed.  Drugs sorted by system:  Neurologic/Psychologic: alprazolam, trazodone  Hematologic: aspirin, clopidogrel  Cardiovascular: atorvastatin, hydrochlorothiazide, losartan  Gastrointestinal: Miralax  Endocrine: insulin glargine  Pain: acetaminophen, methocarbamol  Vitamins/Minerals/Supplements: MVI  Medication Review Findings:  . Metformin:  D/c'd on hospital AVS, possibly due to elevated SCr. Patient reports still taking.   . Simvastatin:  D/c'd on hospital AVS, patient still taking it along with atorvastatin, had o/v with PCP today who told her to stop it, states she will discard it today to avoid duplicate statin therapy with Lipitor. Marland Kitchen. Clopidogrel: Picked up on 5/9, however patient states medication was lost and she has not started it at all.  Written for 3 week duration.  Unclear if patient should restart course or how to handle missed doses.  . Aspirin: Bought Aspirin 81mg  this week but has not started it.    . Hydrochlorothiazide:  Medication remains on active medication list on AVS but patient states she has not been taking it for several months.  . Alprazolam: This drug is identified in the Beers Criteria as a potentially inappropriate medication to be avoided in patients 65 years and older due to increased risk of  impaired cognition, delirium, falls, fractures, and motor vehicle accidents with benzodiazepine use. Per Beers list, this medication should be avoided in elderly patients with dementia or cognitive impairment because of adverse CNS effects.   Medication Adherence Findings: Adherence Review  []  Excellent (no doses missed/week)     []  Good (no more than 1 dose missed/week)     []  Partial (2-3 doses missed/week) [x]  Poor (>3 doses missed/week)  Patient with poor understanding of regimen and poor understanding of indications.    Medication Assistance Findings:  No medication assistance needs identified  Plan: . Message left with PCP office to clarify medication regimen for patient specifically for metformin, clopidogrel, aspirin, and HCTZ . Will f/u later this week if I have not heard back yet  Haynes Hoehnolleen Traci Plemons, PharmD, [facility] Clinical Pharmacist [facility] [phone]

## 2018-11-14 ENCOUNTER — Other Ambulatory Visit: Payer: Self-pay

## 2018-11-14 ENCOUNTER — Ambulatory Visit: Payer: HMO | Admitting: Pharmacist

## 2018-11-14 DIAGNOSIS — F419 Anxiety disorder, unspecified: Secondary | ICD-10-CM | POA: Diagnosis not present

## 2018-11-14 DIAGNOSIS — I69354 Hemiplegia and hemiparesis following cerebral infarction affecting left non-dominant side: Secondary | ICD-10-CM | POA: Diagnosis not present

## 2018-11-14 DIAGNOSIS — Z87891 Personal history of nicotine dependence: Secondary | ICD-10-CM | POA: Diagnosis not present

## 2018-11-14 DIAGNOSIS — I129 Hypertensive chronic kidney disease with stage 1 through stage 4 chronic kidney disease, or unspecified chronic kidney disease: Secondary | ICD-10-CM | POA: Diagnosis not present

## 2018-11-14 DIAGNOSIS — Z6832 Body mass index (BMI) 32.0-32.9, adult: Secondary | ICD-10-CM | POA: Diagnosis not present

## 2018-11-14 DIAGNOSIS — F319 Bipolar disorder, unspecified: Secondary | ICD-10-CM | POA: Diagnosis not present

## 2018-11-14 DIAGNOSIS — E669 Obesity, unspecified: Secondary | ICD-10-CM | POA: Diagnosis not present

## 2018-11-14 DIAGNOSIS — E1142 Type 2 diabetes mellitus with diabetic polyneuropathy: Secondary | ICD-10-CM | POA: Diagnosis not present

## 2018-11-14 DIAGNOSIS — E1122 Type 2 diabetes mellitus with diabetic chronic kidney disease: Secondary | ICD-10-CM | POA: Diagnosis not present

## 2018-11-14 DIAGNOSIS — F1290 Cannabis use, unspecified, uncomplicated: Secondary | ICD-10-CM | POA: Diagnosis not present

## 2018-11-14 DIAGNOSIS — M791 Myalgia, unspecified site: Secondary | ICD-10-CM | POA: Diagnosis not present

## 2018-11-14 DIAGNOSIS — Z794 Long term (current) use of insulin: Secondary | ICD-10-CM | POA: Diagnosis not present

## 2018-11-14 DIAGNOSIS — N183 Chronic kidney disease, stage 3 (moderate): Secondary | ICD-10-CM | POA: Diagnosis not present

## 2018-11-14 DIAGNOSIS — E785 Hyperlipidemia, unspecified: Secondary | ICD-10-CM | POA: Diagnosis not present

## 2018-11-14 DIAGNOSIS — F329 Major depressive disorder, single episode, unspecified: Secondary | ICD-10-CM | POA: Diagnosis not present

## 2018-11-14 NOTE — Patient Outreach (Signed)
[facility]) Care Management  11/14/2018  Cynthia Hunt August 20, 1945 [id number]   Successful follow up call to patient today.  Patient reports that appointment with Dr. Allena Katz on 11/15/18 will be a televisit so she is not in need of transportation.   BSW informed her that SCAT application was submitted on 11/10/18.  BSW will follow up with SCAT eligibility within the next two weeks regarding status of application.   BSW will follow up with patient once determination is made.  At that time, BSW will also review Advance Directive documentation that was mailed to patient.    Malachy Chamber, BSW Social Worker [phone]

## 2018-11-15 ENCOUNTER — Other Ambulatory Visit: Payer: Self-pay

## 2018-11-15 ENCOUNTER — Encounter: Payer: HMO | Admitting: Physical Medicine & Rehabilitation

## 2018-11-16 ENCOUNTER — Ambulatory Visit: Payer: Self-pay | Admitting: Pharmacist

## 2018-11-16 ENCOUNTER — Other Ambulatory Visit: Payer: Self-pay | Admitting: Pharmacist

## 2018-11-16 NOTE — Patient Outreach (Signed)
[facility]) Care Management  [facility] 11/16/2018  Vallerie Toigo Eke [birth year] [id number]  Reason for call: f/u on medication discrepancies.  Please see note from 11/13/2018 for further details.   2nd telephone message left with Dr. Glendale Chard office regarding medication clarification.   Plan: F/u in 2-3 business days if I have not heard back yet.   Haynes Hoehn, PharmD, [facility] Clinical Pharmacist [facility] [phone]

## 2018-11-17 ENCOUNTER — Encounter: Payer: Self-pay | Admitting: Physical Medicine & Rehabilitation

## 2018-11-17 ENCOUNTER — Encounter: Payer: HMO | Attending: Physical Medicine & Rehabilitation | Admitting: Physical Medicine & Rehabilitation

## 2018-11-17 ENCOUNTER — Other Ambulatory Visit: Payer: Self-pay

## 2018-11-17 DIAGNOSIS — K5901 Slow transit constipation: Secondary | ICD-10-CM

## 2018-11-17 DIAGNOSIS — I639 Cerebral infarction, unspecified: Secondary | ICD-10-CM

## 2018-11-17 DIAGNOSIS — I10 Essential (primary) hypertension: Secondary | ICD-10-CM

## 2018-11-17 DIAGNOSIS — IMO0001 Reserved for inherently not codable concepts without codable children: Secondary | ICD-10-CM

## 2018-11-17 DIAGNOSIS — I63511 Cerebral infarction due to unspecified occlusion or stenosis of right middle cerebral artery: Secondary | ICD-10-CM | POA: Diagnosis not present

## 2018-11-17 NOTE — Progress Notes (Signed)
Subjective:    Patient ID: Cynthia Hunt, female    DOB: August 16, 1945, 73 y.o.   MRN: [id number]  TELEHEALTH NOTE  Due to national recommendations of social distancing due to COVID 19, an audio/video telehealth visit is felt to be most appropriate for this patient at this time.  See Chart message from today for the patient's consent [facility].     I verified that I am speaking with the correct person using two identifiers.  Location of patient: Home Location of provider: Office Method of communication: Telephone Names of participants : Wadie LessenLisa Kellner scheduling, Silas SacramentoLakeesha Lee obtaining consent and vitals if available Established patient Time spent on call: 15 minutes  HPI 73 year old right-handed female with history of hypertension, diabetes mellitus, hyperlipidemia, marijuana use presents for hospital follow-up after receiving CIR for right MCA frequent subcortical infarcts.  At discharge, she was instructed to take ASA and Plavix, she believes she is taking both.  She states she forgot to take ASA before, but is taking it now.  She is not sleeping well. BP has been relatively controlled. She has not checked her CBGs, but states she recently received glucometer. She states she is not smoking. She states she has meds for bowel movements as necessary.  Denies falls. She has not followed up with Neurology.   Therapies: 2/week DME:  Shower chair, bedside commode. Mobility: Walker at all times.  Pain Inventory Average Pain 9 Pain Right Now 7 My pain is constant and aching  In the last 24 hours, has pain interfered with the following? General activity 0 Relation with others 0 Enjoyment of life 0 What TIME of day is your pain at its worst? night Sleep (in general) Fair  Pain is worse with: inactivity and first waking up Pain improves with: rest and medication Relief from Meds: 6  Mobility use a walker how many minutes can  you walk? unsure ability to climb steps?  yes do you drive?  yes  Function retired  Neuro/Psych weakness trouble walking  Prior Studies Any changes since last visit?  no  Physicians involved in your care Primary care Dr. Darrow Bussingibas Koirala   Family History  Problem Relation Age of Onset  . Stroke Mother    Social History   Socioeconomic History  . Marital status: Single    Spouse name: Not on file  . Number of children: Not on file  . Years of education: Not on file  . Highest education level: Not on file  Occupational History  . Occupation: retired  Engineer, productionocial Needs  . Financial resource strain: Not on file  . Food insecurity:    Worry: Never true    Inability: Never true  . Transportation needs:    Medical: Yes    Non-medical: Yes  Tobacco Use  . Smoking status: Former Smoker    Last attempt to quit: 03/18/2006    Years since quitting: 12.6  . Smokeless tobacco: Never Used  Substance and Sexual Activity  . Alcohol use: Yes    Comment: occasionally when out to eat  . Drug use: Not Currently    Types: Marijuana    Comment: pt reports using years ago  . Sexual activity: Not on file  Lifestyle  . Physical activity:    Days per week: Not on file    Minutes per session: Not on file  . Stress: Not on file  Relationships  . Social connections:    Talks on phone:  Not on file    Gets together: Not on file    Attends religious service: Not on file    Active member of club or organization: Not on file    Attends meetings of clubs or organizations: Not on file    Relationship status: Not on file  Other Topics Concern  . Not on file  Social History Narrative  . Not on file   Past Surgical History:  Procedure Laterality Date  . BUBBLE STUDY  10/24/2018   Procedure: BUBBLE STUDY;  Surgeon: Jake Bathe, MD;  Location: [facility] ENDOSCOPY;  Service: Cardiovascular;;  . TEE WITHOUT CARDIOVERSION N/A 10/24/2018   Procedure: TRANSESOPHAGEAL ECHOCARDIOGRAM (TEE);  Surgeon:  Jake Bathe, MD;  Location: [facility] ENDOSCOPY;  Service: Cardiovascular;  Laterality: N/A;  . UTERINE FIBROID SURGERY     Past Medical History:  Diagnosis Date  . Depression   . Diabetes mellitus without complication (HCC)   . Hyperlipidemia   . Hypertension    There were no vitals taken for this visit.  Opioid Risk Score:   Fall Risk Score:  `1  Depression screen PHQ 2/9  Depression screen [facility] 2/9 11/17/2018 11/09/2018 03/18/2014  Decreased Interest 0 0 1  Down, Depressed, Hopeless 1 1 0  PHQ - 2 Score 1 1 1     Review of Systems  Constitutional: Negative.   HENT: Negative.   Eyes: Negative.   Respiratory: Negative.   Cardiovascular: Negative.   Gastrointestinal: Negative.   Endocrine: Negative.   Genitourinary: Negative.   Musculoskeletal: Positive for joint swelling and myalgias.  Skin: Negative.   Allergic/Immunologic: Negative.   Neurological: Positive for weakness.  Hematological: Negative.   Psychiatric/Behavioral: Negative.   All other systems reviewed and are negative.      Objective:   Physical Exam Gen: NAD. Pulm: Effort normal Neuro: Alert and oriented    Assessment & Plan:  73 year old right-handed female with history of hypertension, diabetes mellitus, hyperlipidemia, marijuana use presents for hospital follow-up after receiving CIR for right MCA frequent subcortical infarcts.  1.  Left sided weakness secondary to right MCA cortical and subcortical infarct on 10/22/2018.  Cont therapies  Follow up with Neurology - needs appointment  2. Sleep disturbance  Trial Melatonin OTC  3. Hypertension.   Cont meds  Relatively controlled at present  4. Diabetes mellitus   Encourage following of CBGs  Cont meds  Follow up with PCP  5. Gait abnormality  Cont therapies  Cont walker for safety

## 2018-11-21 ENCOUNTER — Ambulatory Visit: Payer: Self-pay

## 2018-11-22 ENCOUNTER — Ambulatory Visit: Payer: Self-pay | Admitting: Pharmacist

## 2018-11-22 ENCOUNTER — Other Ambulatory Visit: Payer: Self-pay

## 2018-11-22 ENCOUNTER — Other Ambulatory Visit: Payer: Self-pay | Admitting: Pharmacist

## 2018-11-22 NOTE — Patient Outreach (Signed)
[facility]) Care Management  [facility] 11/22/2018  Shatima Arrambide Creeden 09/05/1945 [id number]  Incoming call and voicemail from [place] at Dr. Glendale Chard office.  Dr. Docia Chuck recommends:  1) hold metformin and HCTZ until lab work can be done to check renal function and 2) continue ASA 81mg  alone if unable to find clopidogrel  Successful call to Ms. Demario today to provide update from PCP.  Patient reports she did find clopidogrel after we spoke on the phone last week and has been taking it.  She states she will continue to take the remaining 2 weeks, then only take ASA 81mg .  She voiced understanding to hold metformin and to continue to hold HCTZ.  She states she will call to make an appt for blood work later today.  Sjhe does not have home health to take blood work at home.  Patient denies any other medication questions or concerns. She states she understands how to take her medications now.   She has my contact information if she needs to reach out to me in the future.   Return call and message left for [facility] at Dr. Glendale Chard office to update regarding lab work and clopidogrel.    Plan: -Will close [facility] pharmacy case as no other medication issues at this time.  -Am happy to assist in the future as needed.   Haynes Hoehn, PharmD, [facility] Clinical Pharmacist [facility] [phone]

## 2018-11-22 NOTE — Patient Outreach (Signed)
[facility]) Care Management  11/22/2018  Cynthia Hunt 08/25/1945 [id number]  BSW received communication that patient was approved for SCAT services.   Successful outreach to patient to inform her of approval and that she should receive certification packet in the mail soon.   BSW inquired about whether or not she has received Advance Directive EMMI and packet that were mailed on 11/14/18.  Patient was unsure if she has received it and requested a call back before the end of the week.  Malachy Chamber, BSW Social Worker [phone]

## 2018-11-23 ENCOUNTER — Ambulatory Visit: Payer: Self-pay

## 2018-11-23 ENCOUNTER — Other Ambulatory Visit: Payer: Self-pay

## 2018-11-23 NOTE — Patient Outreach (Signed)
  [facility]) Care Management Chronic Special Needs Program  11/23/2018  Name: Cynthia Hunt DOB: 12/17/1945  MRN: [id number]  [name] is enrolled in a chronic special needs plan for Diabetes. Client called for follow up No answer and HIPAA compliant message left. Chronic care management coordinator will attempt outreach in 2 weeks.   Dudley Major RN, Maximiano Coss, CDE Chronic Care Management Coordinator [facility] [phone]

## 2018-11-24 ENCOUNTER — Other Ambulatory Visit: Payer: Self-pay

## 2018-11-24 ENCOUNTER — Ambulatory Visit: Payer: Self-pay

## 2018-11-24 DIAGNOSIS — I951 Orthostatic hypotension: Secondary | ICD-10-CM | POA: Diagnosis not present

## 2018-11-24 DIAGNOSIS — D649 Anemia, unspecified: Secondary | ICD-10-CM | POA: Diagnosis not present

## 2018-11-24 NOTE — Patient Outreach (Addendum)
[facility]) Care Management  11/24/2018  Oneika Exner Taborda December 05, 1945 [id number]   Unsuccessful outreach to patient to ensure receipt of Advance Directives.  Left voicemail message.  Will attempt to reach again within four business days.  Addendum: Received return call from patient, however, she did not receive Advance Directive and EMMI that were mailed on 11/09/18.  Mailing again today.  Will follow up within the next two weeks to ensure receipt.  Malachy Chamber, BSW Social Worker [phone]

## 2018-11-27 ENCOUNTER — Ambulatory Visit: Payer: Self-pay

## 2018-11-29 ENCOUNTER — Ambulatory Visit: Payer: HMO

## 2018-11-29 DIAGNOSIS — E1122 Type 2 diabetes mellitus with diabetic chronic kidney disease: Secondary | ICD-10-CM | POA: Diagnosis not present

## 2018-11-29 DIAGNOSIS — Z87891 Personal history of nicotine dependence: Secondary | ICD-10-CM | POA: Diagnosis not present

## 2018-11-29 DIAGNOSIS — F329 Major depressive disorder, single episode, unspecified: Secondary | ICD-10-CM | POA: Diagnosis not present

## 2018-11-29 DIAGNOSIS — N183 Chronic kidney disease, stage 3 (moderate): Secondary | ICD-10-CM | POA: Diagnosis not present

## 2018-11-29 DIAGNOSIS — E669 Obesity, unspecified: Secondary | ICD-10-CM | POA: Diagnosis not present

## 2018-11-29 DIAGNOSIS — E785 Hyperlipidemia, unspecified: Secondary | ICD-10-CM | POA: Diagnosis not present

## 2018-11-29 DIAGNOSIS — I129 Hypertensive chronic kidney disease with stage 1 through stage 4 chronic kidney disease, or unspecified chronic kidney disease: Secondary | ICD-10-CM | POA: Diagnosis not present

## 2018-11-29 DIAGNOSIS — E1142 Type 2 diabetes mellitus with diabetic polyneuropathy: Secondary | ICD-10-CM | POA: Diagnosis not present

## 2018-11-29 DIAGNOSIS — Z794 Long term (current) use of insulin: Secondary | ICD-10-CM | POA: Diagnosis not present

## 2018-11-29 DIAGNOSIS — F419 Anxiety disorder, unspecified: Secondary | ICD-10-CM | POA: Diagnosis not present

## 2018-11-29 DIAGNOSIS — M791 Myalgia, unspecified site: Secondary | ICD-10-CM | POA: Diagnosis not present

## 2018-11-29 DIAGNOSIS — Z6832 Body mass index (BMI) 32.0-32.9, adult: Secondary | ICD-10-CM | POA: Diagnosis not present

## 2018-11-29 DIAGNOSIS — F319 Bipolar disorder, unspecified: Secondary | ICD-10-CM | POA: Diagnosis not present

## 2018-11-29 DIAGNOSIS — I69354 Hemiplegia and hemiparesis following cerebral infarction affecting left non-dominant side: Secondary | ICD-10-CM | POA: Diagnosis not present

## 2018-11-29 DIAGNOSIS — F1290 Cannabis use, unspecified, uncomplicated: Secondary | ICD-10-CM | POA: Diagnosis not present

## 2018-11-30 ENCOUNTER — Other Ambulatory Visit: Payer: Self-pay

## 2018-11-30 NOTE — Patient Outreach (Signed)
  [facility]) Care Management Chronic Special Needs Program    11/30/2018  Name: Cynthia Hunt, DOB: 22-Jul-1945  MRN: [id number]   Ms. Lamyra Josephson is enrolled in a chronic special needs plan for Diabetes. Called client who had left message returning previous call. States that her doctor prescribed an Accu-chek glucometer for her and she does not know how to use it.   Client could not find her glucometer for RNCM to help her learn how to use.  States that she had to buy this meter. Instructed client that is she had a preferred meter that [employer] it would be at no cost and her supplies would be at no cost also. Instructed client that [facility] will contract her provider to request that he send in order for preferred meter and strips to her pharmacy. Instructed client that RNCM will assist her with learning how to use the meter as much is possible over the phone and to contact RNCM when she has her new meter.  Voiced understanding. Plan to contact Dr. Docia Chuck to request that he send in order for preferred meters (One Touch, Freestyle or Precision) and strips to her pharmacy. Called and left detailed voice mail to provider to order glucometer and testing supplies Plan to contact client on scheduled visit next week or sooner if needed  Dudley Major RN, Maximiano Coss, CDE Chronic Care Management Coordinator [facility] Management [phone]

## 2018-12-05 ENCOUNTER — Other Ambulatory Visit: Payer: Self-pay

## 2018-12-05 NOTE — Patient Outreach (Signed)
  [facility]) Care Management Chronic Special Needs Program  12/05/2018  Name: Cynthia Hunt DOB: March 01, 1946  MRN: [id number]  [name] is enrolled in a chronic special needs plan for Diabetes. Client called to follow up from call last week No answer and HIPAA compliant message left. Plan for outreach call in one week if call not returned Chronic care management coordinator will attempt outreach in one week.   Peter Garter RN, Jackquline Denmark, CDE Chronic Care Management Coordinator [place] [facility] [phone]

## 2018-12-06 ENCOUNTER — Other Ambulatory Visit: Payer: Self-pay

## 2018-12-06 DIAGNOSIS — Z79899 Other long term (current) drug therapy: Secondary | ICD-10-CM | POA: Diagnosis not present

## 2018-12-06 DIAGNOSIS — Z794 Long term (current) use of insulin: Secondary | ICD-10-CM | POA: Diagnosis not present

## 2018-12-06 DIAGNOSIS — E1142 Type 2 diabetes mellitus with diabetic polyneuropathy: Secondary | ICD-10-CM | POA: Diagnosis not present

## 2018-12-06 DIAGNOSIS — I69354 Hemiplegia and hemiparesis following cerebral infarction affecting left non-dominant side: Secondary | ICD-10-CM | POA: Diagnosis not present

## 2018-12-06 DIAGNOSIS — E1165 Type 2 diabetes mellitus with hyperglycemia: Secondary | ICD-10-CM | POA: Diagnosis not present

## 2018-12-06 DIAGNOSIS — I10 Essential (primary) hypertension: Secondary | ICD-10-CM | POA: Diagnosis not present

## 2018-12-06 NOTE — Patient Outreach (Signed)
  [place]) Care Management Chronic Special Needs Program    12/06/2018  Name: Cynthia Hunt, DOB: 10-Aug-1945  MRN: [id number]   [name] is enrolled in a chronic special needs plan for Diabetes. Client returned call from yesterday. States she now has a new glucometer but she has not opened the box.  States she is going to her doctor later today and she is going to take the glucometer with her to have them show her how to use. Instructed her to call RNCM if she has any difficulty with checking her blood sugars.   Plan to outreach next week as scheduled to follow up with client.  Peter Garter RN, Jackquline Denmark, CDE Chronic Care Management Coordinator [place] [facility] Care [facility] [phone]

## 2018-12-07 ENCOUNTER — Other Ambulatory Visit: Payer: Self-pay

## 2018-12-07 NOTE — Patient Outreach (Signed)
[facility]) Care Management  12/07/2018  Lori Popowski Lorton Sep 06, 1945 [id number]   Successful outreach to patient today, however, she reports that she still has not received the Advance Directive EMMI and packet that were mailed for the second time on 11/24/18.  Patient confirmed address.  BSW will follow up again next week and send documentation again if necessary.  Ronn Melena, BSW Social Worker [phone]

## 2018-12-11 ENCOUNTER — Other Ambulatory Visit: Payer: Self-pay

## 2018-12-11 NOTE — Patient Outreach (Signed)
[facility]) Care Management  12/11/2018  Cynthia Hunt April 24, 1946 [id number]   Successful outreach to patient, however, she still has not received Advance Directive EMMI and packet that were mailed on two separate occassions.  Address has been confirmed.  BSW inquired about emailing documents but patient does not use email.  Mailed documents again today.  Ronn Melena, BSW Social Worker [phone]

## 2018-12-12 ENCOUNTER — Ambulatory Visit: Payer: Self-pay

## 2018-12-13 ENCOUNTER — Other Ambulatory Visit: Payer: Self-pay

## 2018-12-13 DIAGNOSIS — F1290 Cannabis use, unspecified, uncomplicated: Secondary | ICD-10-CM | POA: Diagnosis not present

## 2018-12-13 DIAGNOSIS — F419 Anxiety disorder, unspecified: Secondary | ICD-10-CM | POA: Diagnosis not present

## 2018-12-13 DIAGNOSIS — Z6832 Body mass index (BMI) 32.0-32.9, adult: Secondary | ICD-10-CM | POA: Diagnosis not present

## 2018-12-13 DIAGNOSIS — I129 Hypertensive chronic kidney disease with stage 1 through stage 4 chronic kidney disease, or unspecified chronic kidney disease: Secondary | ICD-10-CM | POA: Diagnosis not present

## 2018-12-13 DIAGNOSIS — E785 Hyperlipidemia, unspecified: Secondary | ICD-10-CM | POA: Diagnosis not present

## 2018-12-13 DIAGNOSIS — I69354 Hemiplegia and hemiparesis following cerebral infarction affecting left non-dominant side: Secondary | ICD-10-CM | POA: Diagnosis not present

## 2018-12-13 DIAGNOSIS — M791 Myalgia, unspecified site: Secondary | ICD-10-CM | POA: Diagnosis not present

## 2018-12-13 DIAGNOSIS — N183 Chronic kidney disease, stage 3 (moderate): Secondary | ICD-10-CM | POA: Diagnosis not present

## 2018-12-13 DIAGNOSIS — F329 Major depressive disorder, single episode, unspecified: Secondary | ICD-10-CM | POA: Diagnosis not present

## 2018-12-13 DIAGNOSIS — E669 Obesity, unspecified: Secondary | ICD-10-CM | POA: Diagnosis not present

## 2018-12-13 DIAGNOSIS — Z794 Long term (current) use of insulin: Secondary | ICD-10-CM | POA: Diagnosis not present

## 2018-12-13 DIAGNOSIS — E1142 Type 2 diabetes mellitus with diabetic polyneuropathy: Secondary | ICD-10-CM | POA: Diagnosis not present

## 2018-12-13 DIAGNOSIS — E1122 Type 2 diabetes mellitus with diabetic chronic kidney disease: Secondary | ICD-10-CM | POA: Diagnosis not present

## 2018-12-13 DIAGNOSIS — Z87891 Personal history of nicotine dependence: Secondary | ICD-10-CM | POA: Diagnosis not present

## 2018-12-13 DIAGNOSIS — F319 Bipolar disorder, unspecified: Secondary | ICD-10-CM | POA: Diagnosis not present

## 2018-12-13 NOTE — Patient Outreach (Signed)
  [facility]) Care Management Chronic Special Needs Program    12/13/2018  Name: Cynthia Hunt, DOB: 01/07/1946  MRN: [id number]   [name] is enrolled in a chronic special needs plan for Diabetes. Called to follow up with client about her provider office visit last week.  States that her Hemoglobin A1C has gone down to 7.9%.  States he gave her some medication for her pain but it has not starting working yet.  States she has not started taking her blood sugars yet but she does have her glucometer now.  States her nephew has been helping her now that her [name] has gone back to [place].  Instructed that it can take a few weeks for gabapentin to start to work and to call her MD if it does not help after a few weeks. Instructed to call RNCM if she has difficulty with her glucometer when she starts to use.  Reinforced importance of checking her CBG regularly. Plan to call in one month to follow up or sooner as needed [place], Jackquline Denmark, [place] Management [phone]

## 2018-12-25 ENCOUNTER — Other Ambulatory Visit: Payer: Self-pay

## 2018-12-25 NOTE — Patient Outreach (Signed)
[facility]) Care Management  12/25/2018  [place] [birth year] [id number]   Successful outreach to patient to ensure receipt of and review Advance Directives that have been mailed on three occassions.  Patient reports that she has a lot of mail to go through so she is unsure if most recent packet was received.  BSW provided her with contact information and encouraged her to call if packet was not received or if she wishes to review.  Closing case at this time.   Ronn Melena, BSW Social Worker [phone]

## 2019-01-11 ENCOUNTER — Ambulatory Visit: Payer: HMO | Admitting: *Deleted

## 2019-01-15 ENCOUNTER — Ambulatory Visit: Payer: HMO | Admitting: Physical Medicine & Rehabilitation

## 2019-01-15 ENCOUNTER — Ambulatory Visit: Payer: Self-pay

## 2019-01-18 ENCOUNTER — Other Ambulatory Visit: Payer: Self-pay

## 2019-01-18 NOTE — Patient Outreach (Signed)
[facility]) Care Management Chronic Special Needs Program  01/18/2019  Name: Cynthia Hunt DOB: 13-Aug-1945  MRN: [id number]  Ms. [name] is enrolled in a chronic special needs plan for Diabetes. Reviewed and updated care plan.  Subjective: Client states she has an appt on Monday but she is not sure which doctor it is with.  States she is still not checking her blood sugars as sticking her "fingers makes her fingers get infected".  States she is also not currently taking her insulin but is taking her metformin.  States she is not eating sweets anymore so she does not think she needs the insulin.  States her nephew is helping her daily with her medications, meals and transportation.  States she did get the forms the the social worker sent her but she has not filled them out yet.  Denies any recent falls and states she is using her walker.  States she is to get more physical therapy but  therapy is waiting on getting approval again for more visits.  Goals Addressed            This Visit's Progress   . COMPLETED:  Acknowledge receipt of Advanced Directive package       Received forms    . Advanced Care Planning complete by next 9 months   On track         . Client understands the importance of follow-up with providers by attending scheduled visits   On track   . COMPLETED: Client will have ADL/IADL needs(transportation) addressed by next 3 months      . Client will not report change from baseline and no repeated symptoms of stroke with in the next 6 months    On track   . Client will report no fall or injuries in the next 6 months.   On track   . Client will verbalize knowledge of self management of Hypertension as evidences by BP reading of 140/90 or less; or as defined by provider   On track   . Maintain timely refills of diabetic medication as prescribed within the year .   On track   . Obtain annual  Lipid Profile, LDL-C   On track    Completed 10/23/18    . Obtain Annual Eye (retinal)  Exam    On track   . Obtain Annual Foot Exam       Completed 09/06/18    . Obtain annual screen for micro albuminuria (urine) , nephropathy (kidney problems)       Completed 09/06/18    . Obtain Hemoglobin A1C at least 2 times per year   On track   . Visit Primary Care Provider or Endocrinologist at least 2 times per year    On track    Client is not meeting diabetes self management goal of hemoglobin A1C of <7% with last reading of 7.9%.  Client has Advanced Directives forms but has not completed yet(Social work has closed case) Client has SCAT for transportation and nephew also takes to appts and assists with other IADLs. Instructed client that she has an appt with Dr. Lonzo CloudShamleffer (endocrinologist) on Monday at 2 PM.  Client given address and phone number for Dr. Harvel RicksShamleffer's office Instructed client to take her glucometer with her to visit to get further instruction on use and to take her Freestyle Josephine IgoLibre also to see if they can assist her with getting it to work again.   Instructed to discuss her not taking  insulin at her visit and reinforced importance of following her doctors recomendations. Reinforced safety precautions and to use her walker  Reinforced to follow low CHO, low sodium diet. Reviewed number for 24 hour [place] cause, symptoms, precautions (social distancing, stay at home order, hand washing), confirmed client knows how to contact provider    Plan:  Send successful outreach letter with a copy of their individualized care plan and Send individual care plan to provider  Chronic care management coordinator will outreach in:  3 Months     [place], [facility], [place] Management Coordinator [place] Management [phone]

## 2019-01-22 ENCOUNTER — Other Ambulatory Visit: Payer: Self-pay

## 2019-01-22 ENCOUNTER — Ambulatory Visit (INDEPENDENT_AMBULATORY_CARE_PROVIDER_SITE_OTHER): Payer: HMO | Admitting: Internal Medicine

## 2019-01-22 VITALS — BP 124/64 | HR 84 | Temp 98.5°F | Ht 66.0 in | Wt 192.6 lb

## 2019-01-22 DIAGNOSIS — E1165 Type 2 diabetes mellitus with hyperglycemia: Secondary | ICD-10-CM | POA: Diagnosis not present

## 2019-01-22 DIAGNOSIS — E785 Hyperlipidemia, unspecified: Secondary | ICD-10-CM

## 2019-01-22 DIAGNOSIS — E1142 Type 2 diabetes mellitus with diabetic polyneuropathy: Secondary | ICD-10-CM | POA: Diagnosis not present

## 2019-01-22 DIAGNOSIS — Z794 Long term (current) use of insulin: Secondary | ICD-10-CM | POA: Diagnosis not present

## 2019-01-22 DIAGNOSIS — N183 Chronic kidney disease, stage 3 unspecified: Secondary | ICD-10-CM

## 2019-01-22 DIAGNOSIS — E1159 Type 2 diabetes mellitus with other circulatory complications: Secondary | ICD-10-CM | POA: Diagnosis not present

## 2019-01-22 DIAGNOSIS — IMO0001 Reserved for inherently not codable concepts without codable children: Secondary | ICD-10-CM

## 2019-01-22 DIAGNOSIS — E1122 Type 2 diabetes mellitus with diabetic chronic kidney disease: Secondary | ICD-10-CM | POA: Diagnosis not present

## 2019-01-22 LAB — GLUCOSE, POCT (MANUAL RESULT ENTRY): POC Glucose: 148 mg/dl — AB (ref 70–99)

## 2019-01-22 MED ORDER — METFORMIN HCL 1000 MG PO TABS: 1000.0000 mg | ORAL_TABLET | Freq: Every day | ORAL | 3 refills | Status: AC

## 2019-01-22 MED ORDER — GLIPIZIDE 5 MG PO TABS: 5.0000 mg | ORAL_TABLET | Freq: Every day | ORAL | 3 refills | Status: DC

## 2019-01-22 MED ORDER — GLUCOSE BLOOD VI STRP: ORAL_STRIP | 12 refills | Status: AC

## 2019-01-22 NOTE — Patient Instructions (Signed)
-   Decrease Metformin 1000 mg, to ONE tablet with Breakfast  - STOP Glimepiride  - Start Glipizide 5 mg, ONE tablet with Breakfast   - Make sure you are on lipitor for cholesterol and to prevent future strokes     - Check sugar before Breakfast and bedtime     - HOW TO TREAT LOW BLOOD SUGARS (Blood sugar LESS THAN 70 MG/DL)  Please follow the RULE OF 15 for the treatment of hypoglycemia treatment (when your (blood sugars are less than 70 mg/dL)    STEP 1: Take 15 grams of carbohydrates when your blood sugar is low, which includes:   3-4 GLUCOSE TABS  OR  3-4 OZ OF JUICE OR REGULAR SODA OR  ONE TUBE OF GLUCOSE GEL     STEP 2: RECHECK blood sugar in 15 MINUTES STEP 3: If your blood sugar is still low at the 15 minute recheck --> then, go back to STEP 1 and treat AGAIN with another 15 grams of carbohydrates.

## 2019-01-22 NOTE — Progress Notes (Signed)
Name: Cynthia Hunt  MRN/ DOB: [id number], [birth year]   Age/ Sex: 73 y.o., female    PCP: Darrow BussingKoirala, [place], Cynthia Hunt   Reason for Endocrinology Evaluation: Type 2 Diabetes Mellitus     Date of Initial Endocrinology Visit: 01/23/2019     PATIENT IDENTIFIER: Cynthia Hunt is a 73 y.o. female with a past medical history of T2DM, HTN and dyslipidemia. The patient presented for initial endocrinology clinic visit on 01/23/2019 for consultative assistance with her diabetes management.    HPI: Cynthia Hunt was    Diagnosed with T2DM many years ago Prior Medications tried/Intolerance: Jardiance-  Dizziness ,   Basal insulin - stopped due to cost Currently checking blood sugars 0x / day. Hypoglycemia episodes : no            Hemoglobin A1c has ranged from 7.7% in 2015, peaking at 12.3% in 2019 Patient required assistance for hypoglycemia: no Patient has required hospitalization within the last 1 year from hyper or hypoglycemia: no  In terms of diet, the patient eats 2-3 meals a day, does not snack.   Pt is a poor historian, she is not clear on what she is taking, she lives alone, has hx of CVA   HOME DIABETES REGIMEN: Metformin 1000 mg BID Glimepiride 2 mg daily - not sure if she is taking  Lantus 30 units daily - Stopped due to cost    Statin: yes ACE-I/ARB:Yes Prior Diabetic Education:Yes    METER DOWNLOAD SUMMARY: Does not check    DIABETIC COMPLICATIONS: Microvascular complications:   CKD, retinopathy , neuropathy  Last eye exam: Completed 01/2018  Macrovascular complications:   CVA  Denies: CAD, PVD   PAST HISTORY: Past Medical History:  Past Medical History:  Diagnosis Date  . Depression   . Diabetes mellitus without complication (HCC)   . Hyperlipidemia   . Hypertension    Past Surgical History:  Past Surgical History:  Procedure Laterality Date  . BUBBLE STUDY  10/24/2018   Procedure: BUBBLE STUDY;  Surgeon: Jake BatheSkains, Mark C, Cynthia Hunt;  [facility];  Service: Cardiovascular;;  . TEE WITHOUT CARDIOVERSION N/A 10/24/2018   Procedure: TRANSESOPHAGEAL ECHOCARDIOGRAM (TEE);  Surgeon: Jake BatheSkains, Mark C, Cynthia Hunt;  [facility];  Service: Cardiovascular;  Laterality: N/A;  . UTERINE FIBROID SURGERY        Social History:  reports that she quit smoking about 12 years ago. She has never used smokeless tobacco. She reports current alcohol use. She reports previous drug use. Drug: Marijuana. Family History:  Family History  Problem Relation Age of Onset  . Stroke Mother      HOME MEDICATIONS: Allergies as of 01/22/2019      Reactions   Penicillins Other (See Comments)   Childhood Allergy Did it involve swelling of the face/tongue/throat, SOB, or low BP? Unknown Did it involve sudden or severe rash/hives, skin peeling, or any reaction on the inside of your mouth or nose? Unknown Did you need to seek medical attention at a hospital or doctor's office? Unknown When did it last happen?childhood If all above answers are "NO", may proceed with cephalosporin use.      Medication List       Accurate as of January 22, 2019 11:59 PM. If you have any questions, ask your nurse or doctor.        STOP taking these medications   Insulin Glargine 100 UNIT/ML Solostar Pen Commonly known as: LANTUS Stopped by: Cynthia ShortsIbtehal J Likisha Alles, Cynthia Hunt  TAKE these medications   acetaminophen 325 MG tablet Commonly known as: TYLENOL Take 2 tablets (650 mg total) by mouth every 4 (four) hours as needed for mild pain (or temp > 37.5 C (99.5 F)).   ALPRAZolam 0.5 MG tablet Commonly known as: XANAX Take 1 tablet (0.5 mg total) by mouth daily as needed (anxiety while driving).   aspirin 81 MG EC tablet Take 1 tablet (81 mg total) by mouth daily.   atorvastatin 40 MG tablet Commonly known as: LIPITOR Take 1 tablet (40 mg total) by mouth daily at 6 PM.   freestyle lancets As directed   glipiZIDE 5 MG tablet Commonly known as: GLUCOTROL Take  1 tablet (5 mg total) by mouth daily before breakfast. Started by: Cynthia ShortsIbtehal J Chong January, Cynthia Hunt   glucose blood test strip Use as instructed to test blood sugar 2 times daily DX E11.65 Started by: Cynthia Hunt, Cynthia Hunt   hydrochlorothiazide 25 MG tablet Commonly known as: HYDRODIURIL Take 1 tablet (25 mg total) by mouth daily.   losartan 100 MG tablet Commonly known as: COZAAR Take 1 tablet (100 mg total) by mouth daily.   metFORMIN 1000 MG tablet Commonly known as: Glucophage Take 1 tablet (1,000 mg total) by mouth daily with breakfast. Started by: Cynthia ShortsIbtehal J Jeany [place], Cynthia Hunt   methocarbamol 500 MG tablet Commonly known as: ROBAXIN Take 1 tablet (500 mg total) by mouth daily.   multivitamin with minerals Tabs tablet Take 1 tablet by mouth daily.   polyethylene glycol 17 g packet Commonly known as: MIRALAX / GLYCOLAX Take 17 g by mouth daily as needed.   traZODone 100 MG tablet Commonly known as: DESYREL Take 1 tablet (100 mg total) by mouth at bedtime.        ALLERGIES: Allergies  Allergen Reactions  . Penicillins Other (See Comments)    Childhood Allergy Did it involve swelling of the face/tongue/throat, SOB, or low BP? Unknown Did it involve sudden or severe rash/hives, skin peeling, or any reaction on the inside of your mouth or nose? Unknown Did you need to seek medical attention at a hospital or doctor's office? Unknown When did it last happen?childhood If all above answers are "NO", may proceed with cephalosporin use.      REVIEW OF SYSTEMS: A comprehensive ROS was conducted with the patient and is negative except as per HPI and below:  Review of Systems  Constitutional: Positive for malaise/fatigue. Negative for fever.  HENT: Negative for congestion and sore throat.   Eyes: Negative for blurred vision and pain.  Respiratory: Negative for cough and shortness of breath.   Cardiovascular: Negative for chest pain and palpitations.  Gastrointestinal:  Negative for diarrhea and nausea.  Genitourinary: Negative for frequency.  Musculoskeletal: Negative for falls.  Neurological: Positive for tingling. Negative for tremors.  Endo/Heme/Allergies: Negative for polydipsia.  Psychiatric/Behavioral: Positive for memory loss.    OBJECTIVE:   VITAL SIGNS: BP 124/64 (BP Location: Left Arm, Patient Position: Sitting, Cuff Size: Normal)   Pulse 84   Temp 98.5 F (36.9 C)   Ht 5\' 6"  (1.676 m)   Wt 192 lb 9.6 oz (87.4 kg)   SpO2 97%   BMI 31.09 kg/m    PHYSICAL EXAM:  General: Pt appears well and is in NAD  Hydration: Well-hydrated with moist mucous membranes and good skin turgor  HEENT: Head: Unremarkable with good dentition. Oropharynx clear without exudate.  Eyes: External eye exam normal without stare, lid lag or exophthalmos.  EOM intact.  Neck: General: Supple without adenopathy or carotid bruits. Thyroid: Thyroid size normal.  No goiter or nodules appreciated. No thyroid bruit.  Lungs: Clear with good BS bilat with no rales, rhonchi, or wheezes  Heart: RRR with normal S1 and S2 and no gallops; no murmurs; no rub  Abdomen: Normoactive bowel sounds, soft, nontender, without masses or organomegaly palpable  Extremities:  Lower extremities - No pretibial edema. No lesions.  Skin: Normal texture and temperature to palpation. No rash noted. No Acanthosis nigricans/skin tags. No lipohypertrophy.  Neuro: MS is good with appropriate affect, pt is alert and Ox3    DM foot exam: 01/23/2019  The skin of the feet is intact without sores or ulcerations. The pedal pulses are 2+ on right and 2+ on left. The sensation is decreased to a screening 5.07, 10 gram monofilament bilaterally\  DATA REVIEWED:  Lab Results  Component Value Date   HGBA1C 9.0 (H) 10/23/2018   HGBA1C 9.2 (H) 10/22/2018   Lab Results  Component Value Date   LDLCALC 81 10/23/2018   CREATININE 1.40 (H) 11/01/2018    Lab Results  Component Value Date   CHOL 136  10/23/2018   HDL 41 10/23/2018   LDLCALC 81 10/23/2018   TRIG 71 10/23/2018   CHOLHDL 3.3 10/23/2018        1/61/09603/04/2019 MA/Cr ratio 482.6 (0.0-30.0)    ASSESSMENT / PLAN / RECOMMENDATIONS:   1) Type 2 Diabetes Mellitus, Sub-Optimally controlled, With Neuropathic, CKD III and Macrovascular complications - Most recent A1c of 7.9%. Goal A1c < 7.5 %.    Plan: GENERAL: I have discussed with the patient the pathophysiology of diabetes. We went over the natural progression of the disease. We talked about both insulin resistance and insulin deficiency. We stressed the importance of lifestyle changes including diet and exercise. I explained the complications associated with diabetes including retinopathy, nephropathy, neuropathy as well as increased risk of cardiovascular disease. We went over the benefit seen with glycemic control.    I explained to the patient that diabetic patients are at higher than normal risk for amputations.   Pt does not know how to use a glucose meter, she was educated today on use, her barriers is to diabetes care is poor memory and not much support. She lives alone, unsure of her medication list, her nephew brought here today, we will contact [facility] to see if there are any resources to help her , pt in agreement with this.   She stopped insulin on her own due to cost, she is not sure if she is taking glimepiride.   We will decrease Metformin by 50% due to a GFR < 45.   MEDICATIONS: - Decrease Metformin 1000 mg, to ONE tablet with Breakfast  - STOP Glimepiride  - Start Glipizide 5 mg, ONE tablet with Breakfast    EDUCATION / INSTRUCTIONS:  BG monitoring instructions: Patient is instructed to check her blood sugars 2 times a day, fasting and bedtime.  Call [facility] if: BG persistently < 70 or > 300. . I reviewed the Rule of 15 for the treatment of hypoglycemia in detail with the patient. Literature supplied.   2) Diabetic complications:    Eye: Does  have known diabetic retinopathy per patient, no laser treatment in the past   Neuro/ Feet: Does have known diabetic peripheral neuropathy.  Renal: Patient does have known baseline CKD. She is on an ACEI/ARB at present.  3) Lipids: Patient has Lipitor on her list, she is not  sure if she is still this. Pt encouraged to restart Lipitor , we discussed the cardiovascular benefits.    4) Hypertension: She is at goal of < 140/90 mmHg.    F/u in 3 months    Signed electronically by: Cynthia Guise, Cynthia Hunt  [facility] Endocrinology  [address] [facility] [address] [place], [place] [address] Phone: [phone] FAX: [phone]   CC: Cynthia Hunt, [place] [address] [address] Phone: [phone]  Fax: [phone]    Return to Endocrinology clinic as below: Future Appointments  Date Time Provider [place]  01/25/2019  2:00 PM Cynthia Arn, Cynthia Hunt [facility] CPR  04/18/2019  1:30 PM Cynthia Ped, Cynthia Hunt [facility] None  05/01/2019  2:00 PM Cynthia Hunt, Cynthia Crazier, Cynthia Hunt [facility] None

## 2019-01-23 ENCOUNTER — Encounter: Payer: Self-pay | Admitting: Internal Medicine

## 2019-01-23 DIAGNOSIS — E119 Type 2 diabetes mellitus without complications: Secondary | ICD-10-CM | POA: Insufficient documentation

## 2019-01-23 DIAGNOSIS — E1165 Type 2 diabetes mellitus with hyperglycemia: Secondary | ICD-10-CM | POA: Insufficient documentation

## 2019-01-23 DIAGNOSIS — E1142 Type 2 diabetes mellitus with diabetic polyneuropathy: Secondary | ICD-10-CM | POA: Insufficient documentation

## 2019-01-23 DIAGNOSIS — E1122 Type 2 diabetes mellitus with diabetic chronic kidney disease: Secondary | ICD-10-CM | POA: Insufficient documentation

## 2019-01-23 DIAGNOSIS — N183 Chronic kidney disease, stage 3 unspecified: Secondary | ICD-10-CM | POA: Insufficient documentation

## 2019-01-25 ENCOUNTER — Encounter: Payer: HMO | Attending: Physical Medicine & Rehabilitation | Admitting: Physical Medicine & Rehabilitation

## 2019-01-25 DIAGNOSIS — E785 Hyperlipidemia, unspecified: Secondary | ICD-10-CM | POA: Diagnosis not present

## 2019-01-25 DIAGNOSIS — Z9181 History of falling: Secondary | ICD-10-CM | POA: Diagnosis not present

## 2019-01-25 DIAGNOSIS — E1122 Type 2 diabetes mellitus with diabetic chronic kidney disease: Secondary | ICD-10-CM | POA: Diagnosis not present

## 2019-01-25 DIAGNOSIS — Z6828 Body mass index (BMI) 28.0-28.9, adult: Secondary | ICD-10-CM | POA: Diagnosis not present

## 2019-01-25 DIAGNOSIS — N183 Chronic kidney disease, stage 3 (moderate): Secondary | ICD-10-CM | POA: Diagnosis not present

## 2019-01-25 DIAGNOSIS — F419 Anxiety disorder, unspecified: Secondary | ICD-10-CM | POA: Diagnosis not present

## 2019-01-25 DIAGNOSIS — I129 Hypertensive chronic kidney disease with stage 1 through stage 4 chronic kidney disease, or unspecified chronic kidney disease: Secondary | ICD-10-CM | POA: Diagnosis not present

## 2019-01-25 DIAGNOSIS — I69354 Hemiplegia and hemiparesis following cerebral infarction affecting left non-dominant side: Secondary | ICD-10-CM | POA: Diagnosis not present

## 2019-01-25 DIAGNOSIS — F319 Bipolar disorder, unspecified: Secondary | ICD-10-CM | POA: Diagnosis not present

## 2019-01-25 DIAGNOSIS — Z794 Long term (current) use of insulin: Secondary | ICD-10-CM | POA: Diagnosis not present

## 2019-01-25 DIAGNOSIS — E1140 Type 2 diabetes mellitus with diabetic neuropathy, unspecified: Secondary | ICD-10-CM | POA: Diagnosis not present

## 2019-01-31 DIAGNOSIS — F319 Bipolar disorder, unspecified: Secondary | ICD-10-CM | POA: Diagnosis not present

## 2019-01-31 DIAGNOSIS — Z9181 History of falling: Secondary | ICD-10-CM | POA: Diagnosis not present

## 2019-01-31 DIAGNOSIS — Z794 Long term (current) use of insulin: Secondary | ICD-10-CM | POA: Diagnosis not present

## 2019-01-31 DIAGNOSIS — I69354 Hemiplegia and hemiparesis following cerebral infarction affecting left non-dominant side: Secondary | ICD-10-CM | POA: Diagnosis not present

## 2019-01-31 DIAGNOSIS — N183 Chronic kidney disease, stage 3 (moderate): Secondary | ICD-10-CM | POA: Diagnosis not present

## 2019-01-31 DIAGNOSIS — E1122 Type 2 diabetes mellitus with diabetic chronic kidney disease: Secondary | ICD-10-CM | POA: Diagnosis not present

## 2019-01-31 DIAGNOSIS — Z6828 Body mass index (BMI) 28.0-28.9, adult: Secondary | ICD-10-CM | POA: Diagnosis not present

## 2019-01-31 DIAGNOSIS — E1140 Type 2 diabetes mellitus with diabetic neuropathy, unspecified: Secondary | ICD-10-CM | POA: Diagnosis not present

## 2019-01-31 DIAGNOSIS — E785 Hyperlipidemia, unspecified: Secondary | ICD-10-CM | POA: Diagnosis not present

## 2019-01-31 DIAGNOSIS — I129 Hypertensive chronic kidney disease with stage 1 through stage 4 chronic kidney disease, or unspecified chronic kidney disease: Secondary | ICD-10-CM | POA: Diagnosis not present

## 2019-01-31 DIAGNOSIS — F419 Anxiety disorder, unspecified: Secondary | ICD-10-CM | POA: Diagnosis not present

## 2019-02-08 DIAGNOSIS — H25813 Combined forms of age-related cataract, bilateral: Secondary | ICD-10-CM | POA: Diagnosis not present

## 2019-02-08 DIAGNOSIS — H5201 Hypermetropia, right eye: Secondary | ICD-10-CM | POA: Diagnosis not present

## 2019-02-08 DIAGNOSIS — H35033 Hypertensive retinopathy, bilateral: Secondary | ICD-10-CM | POA: Diagnosis not present

## 2019-02-08 DIAGNOSIS — E113493 Type 2 diabetes mellitus with severe nonproliferative diabetic retinopathy without macular edema, bilateral: Secondary | ICD-10-CM | POA: Diagnosis not present

## 2019-02-08 DIAGNOSIS — H524 Presbyopia: Secondary | ICD-10-CM | POA: Diagnosis not present

## 2019-02-08 DIAGNOSIS — H52203 Unspecified astigmatism, bilateral: Secondary | ICD-10-CM | POA: Diagnosis not present

## 2019-02-20 DIAGNOSIS — F319 Bipolar disorder, unspecified: Secondary | ICD-10-CM | POA: Diagnosis not present

## 2019-02-20 DIAGNOSIS — Z9181 History of falling: Secondary | ICD-10-CM | POA: Diagnosis not present

## 2019-02-20 DIAGNOSIS — N183 Chronic kidney disease, stage 3 (moderate): Secondary | ICD-10-CM | POA: Diagnosis not present

## 2019-02-20 DIAGNOSIS — I129 Hypertensive chronic kidney disease with stage 1 through stage 4 chronic kidney disease, or unspecified chronic kidney disease: Secondary | ICD-10-CM | POA: Diagnosis not present

## 2019-02-20 DIAGNOSIS — Z794 Long term (current) use of insulin: Secondary | ICD-10-CM | POA: Diagnosis not present

## 2019-02-20 DIAGNOSIS — Z6828 Body mass index (BMI) 28.0-28.9, adult: Secondary | ICD-10-CM | POA: Diagnosis not present

## 2019-02-20 DIAGNOSIS — E1140 Type 2 diabetes mellitus with diabetic neuropathy, unspecified: Secondary | ICD-10-CM | POA: Diagnosis not present

## 2019-02-20 DIAGNOSIS — E1122 Type 2 diabetes mellitus with diabetic chronic kidney disease: Secondary | ICD-10-CM | POA: Diagnosis not present

## 2019-02-20 DIAGNOSIS — I69354 Hemiplegia and hemiparesis following cerebral infarction affecting left non-dominant side: Secondary | ICD-10-CM | POA: Diagnosis not present

## 2019-02-20 DIAGNOSIS — F419 Anxiety disorder, unspecified: Secondary | ICD-10-CM | POA: Diagnosis not present

## 2019-02-20 DIAGNOSIS — E785 Hyperlipidemia, unspecified: Secondary | ICD-10-CM | POA: Diagnosis not present

## 2019-02-28 DIAGNOSIS — E1140 Type 2 diabetes mellitus with diabetic neuropathy, unspecified: Secondary | ICD-10-CM | POA: Diagnosis not present

## 2019-02-28 DIAGNOSIS — Z794 Long term (current) use of insulin: Secondary | ICD-10-CM | POA: Diagnosis not present

## 2019-02-28 DIAGNOSIS — I69354 Hemiplegia and hemiparesis following cerebral infarction affecting left non-dominant side: Secondary | ICD-10-CM | POA: Diagnosis not present

## 2019-02-28 DIAGNOSIS — F319 Bipolar disorder, unspecified: Secondary | ICD-10-CM | POA: Diagnosis not present

## 2019-02-28 DIAGNOSIS — E1122 Type 2 diabetes mellitus with diabetic chronic kidney disease: Secondary | ICD-10-CM | POA: Diagnosis not present

## 2019-02-28 DIAGNOSIS — I129 Hypertensive chronic kidney disease with stage 1 through stage 4 chronic kidney disease, or unspecified chronic kidney disease: Secondary | ICD-10-CM | POA: Diagnosis not present

## 2019-02-28 DIAGNOSIS — F419 Anxiety disorder, unspecified: Secondary | ICD-10-CM | POA: Diagnosis not present

## 2019-02-28 DIAGNOSIS — Z9181 History of falling: Secondary | ICD-10-CM | POA: Diagnosis not present

## 2019-02-28 DIAGNOSIS — Z6828 Body mass index (BMI) 28.0-28.9, adult: Secondary | ICD-10-CM | POA: Diagnosis not present

## 2019-02-28 DIAGNOSIS — E785 Hyperlipidemia, unspecified: Secondary | ICD-10-CM | POA: Diagnosis not present

## 2019-02-28 DIAGNOSIS — N183 Chronic kidney disease, stage 3 (moderate): Secondary | ICD-10-CM | POA: Diagnosis not present

## 2019-03-06 DIAGNOSIS — Z79899 Other long term (current) drug therapy: Secondary | ICD-10-CM | POA: Diagnosis not present

## 2019-03-06 DIAGNOSIS — I69354 Hemiplegia and hemiparesis following cerebral infarction affecting left non-dominant side: Secondary | ICD-10-CM | POA: Diagnosis not present

## 2019-03-06 DIAGNOSIS — G4700 Insomnia, unspecified: Secondary | ICD-10-CM | POA: Diagnosis not present

## 2019-03-06 DIAGNOSIS — E113493 Type 2 diabetes mellitus with severe nonproliferative diabetic retinopathy without macular edema, bilateral: Secondary | ICD-10-CM | POA: Diagnosis not present

## 2019-03-06 DIAGNOSIS — M5030 Other cervical disc degeneration, unspecified cervical region: Secondary | ICD-10-CM | POA: Diagnosis not present

## 2019-03-06 DIAGNOSIS — I10 Essential (primary) hypertension: Secondary | ICD-10-CM | POA: Diagnosis not present

## 2019-03-06 DIAGNOSIS — Z7984 Long term (current) use of oral hypoglycemic drugs: Secondary | ICD-10-CM | POA: Diagnosis not present

## 2019-03-14 DIAGNOSIS — Z794 Long term (current) use of insulin: Secondary | ICD-10-CM | POA: Diagnosis not present

## 2019-03-14 DIAGNOSIS — I69354 Hemiplegia and hemiparesis following cerebral infarction affecting left non-dominant side: Secondary | ICD-10-CM | POA: Diagnosis not present

## 2019-03-14 DIAGNOSIS — F319 Bipolar disorder, unspecified: Secondary | ICD-10-CM | POA: Diagnosis not present

## 2019-03-14 DIAGNOSIS — E785 Hyperlipidemia, unspecified: Secondary | ICD-10-CM | POA: Diagnosis not present

## 2019-03-14 DIAGNOSIS — Z6828 Body mass index (BMI) 28.0-28.9, adult: Secondary | ICD-10-CM | POA: Diagnosis not present

## 2019-03-14 DIAGNOSIS — E1140 Type 2 diabetes mellitus with diabetic neuropathy, unspecified: Secondary | ICD-10-CM | POA: Diagnosis not present

## 2019-03-14 DIAGNOSIS — N183 Chronic kidney disease, stage 3 (moderate): Secondary | ICD-10-CM | POA: Diagnosis not present

## 2019-03-14 DIAGNOSIS — I129 Hypertensive chronic kidney disease with stage 1 through stage 4 chronic kidney disease, or unspecified chronic kidney disease: Secondary | ICD-10-CM | POA: Diagnosis not present

## 2019-03-14 DIAGNOSIS — E1122 Type 2 diabetes mellitus with diabetic chronic kidney disease: Secondary | ICD-10-CM | POA: Diagnosis not present

## 2019-03-14 DIAGNOSIS — F419 Anxiety disorder, unspecified: Secondary | ICD-10-CM | POA: Diagnosis not present

## 2019-03-14 DIAGNOSIS — Z9181 History of falling: Secondary | ICD-10-CM | POA: Diagnosis not present

## 2019-03-16 ENCOUNTER — Ambulatory Visit: Payer: HMO | Admitting: Dietician

## 2019-04-18 ENCOUNTER — Other Ambulatory Visit: Payer: Self-pay

## 2019-04-18 NOTE — Patient Outreach (Signed)
  [facility]) Care Management Chronic Special Needs Program  04/18/2019  Name: Cynthia Hunt DOB: August 09, 1945  MRN: [id number]  Cynthia Hunt is enrolled in a chronic special needs plan for Diabetes. Client called with no answer No answer and unable to leaveHIPAA compliant message as mailbox is full. Plan for 2nd outreach call in one week Chronic care management coordinator will attempt outreach in one week.   Peter Garter RN, Jackquline [place], CDE Chronic Care Management Coordinator [facility] Care Management [phone]

## 2019-04-26 ENCOUNTER — Other Ambulatory Visit: Payer: Self-pay

## 2019-04-26 DIAGNOSIS — E1142 Type 2 diabetes mellitus with diabetic polyneuropathy: Secondary | ICD-10-CM | POA: Diagnosis not present

## 2019-04-26 DIAGNOSIS — I10 Essential (primary) hypertension: Secondary | ICD-10-CM | POA: Diagnosis not present

## 2019-04-26 DIAGNOSIS — Z794 Long term (current) use of insulin: Secondary | ICD-10-CM | POA: Diagnosis not present

## 2019-04-26 DIAGNOSIS — M25619 Stiffness of unspecified shoulder, not elsewhere classified: Secondary | ICD-10-CM | POA: Diagnosis not present

## 2019-04-26 DIAGNOSIS — R399 Unspecified symptoms and signs involving the genitourinary system: Secondary | ICD-10-CM | POA: Diagnosis not present

## 2019-04-26 DIAGNOSIS — I951 Orthostatic hypotension: Secondary | ICD-10-CM | POA: Diagnosis not present

## 2019-04-26 DIAGNOSIS — E119 Type 2 diabetes mellitus without complications: Secondary | ICD-10-CM | POA: Diagnosis not present

## 2019-04-26 DIAGNOSIS — E11319 Type 2 diabetes mellitus with unspecified diabetic retinopathy without macular edema: Secondary | ICD-10-CM | POA: Diagnosis not present

## 2019-04-26 DIAGNOSIS — K5900 Constipation, unspecified: Secondary | ICD-10-CM | POA: Diagnosis not present

## 2019-04-26 DIAGNOSIS — Z79899 Other long term (current) drug therapy: Secondary | ICD-10-CM | POA: Diagnosis not present

## 2019-04-26 NOTE — Patient Outreach (Signed)
  [facility]) Care Management Chronic Special Needs Program  04/26/2019  Name: Cynthia Hunt DOB: 1945/06/29  MRN: [id number]  [name] is enrolled in a chronic special needs plan for Diabetes. Client called with no answer No answer and HIPAA compliant message left. 2nd attempt Plan for 3rd outreach call in one week Chronic care management coordinator will attempt outreach in 1-2 weeks.   Peter Garter RN, Jackquline [place], CDE Chronic Care Management Coordinator [facility] Care Management [phone]

## 2019-04-27 ENCOUNTER — Other Ambulatory Visit: Payer: Self-pay

## 2019-05-01 ENCOUNTER — Ambulatory Visit: Payer: HMO | Admitting: Internal Medicine

## 2019-05-03 DIAGNOSIS — E113493 Type 2 diabetes mellitus with severe nonproliferative diabetic retinopathy without macular edema, bilateral: Secondary | ICD-10-CM | POA: Diagnosis not present

## 2019-05-03 DIAGNOSIS — I10 Essential (primary) hypertension: Secondary | ICD-10-CM | POA: Diagnosis not present

## 2019-05-03 DIAGNOSIS — I69354 Hemiplegia and hemiparesis following cerebral infarction affecting left non-dominant side: Secondary | ICD-10-CM | POA: Diagnosis not present

## 2019-05-03 DIAGNOSIS — M5030 Other cervical disc degeneration, unspecified cervical region: Secondary | ICD-10-CM | POA: Diagnosis not present

## 2019-05-03 DIAGNOSIS — E785 Hyperlipidemia, unspecified: Secondary | ICD-10-CM | POA: Diagnosis not present

## 2019-05-03 DIAGNOSIS — I951 Orthostatic hypotension: Secondary | ICD-10-CM | POA: Diagnosis not present

## 2019-05-03 DIAGNOSIS — Z7984 Long term (current) use of oral hypoglycemic drugs: Secondary | ICD-10-CM | POA: Diagnosis not present

## 2019-05-03 DIAGNOSIS — Z9181 History of falling: Secondary | ICD-10-CM | POA: Diagnosis not present

## 2019-05-03 DIAGNOSIS — F0390 Unspecified dementia without behavioral disturbance: Secondary | ICD-10-CM | POA: Diagnosis not present

## 2019-05-03 DIAGNOSIS — Z87891 Personal history of nicotine dependence: Secondary | ICD-10-CM | POA: Diagnosis not present

## 2019-05-03 DIAGNOSIS — G4700 Insomnia, unspecified: Secondary | ICD-10-CM | POA: Diagnosis not present

## 2019-05-03 DIAGNOSIS — F409 Phobic anxiety disorder, unspecified: Secondary | ICD-10-CM | POA: Diagnosis not present

## 2019-05-03 DIAGNOSIS — H35033 Hypertensive retinopathy, bilateral: Secondary | ICD-10-CM | POA: Diagnosis not present

## 2019-05-03 DIAGNOSIS — E1142 Type 2 diabetes mellitus with diabetic polyneuropathy: Secondary | ICD-10-CM | POA: Diagnosis not present

## 2019-05-03 DIAGNOSIS — M5136 Other intervertebral disc degeneration, lumbar region: Secondary | ICD-10-CM | POA: Diagnosis not present

## 2019-05-07 ENCOUNTER — Other Ambulatory Visit: Payer: Self-pay

## 2019-05-07 NOTE — Patient Outreach (Signed)
[facility]) Care Management Chronic Special Needs Program  05/07/2019  Name: Cynthia Hunt DOB: 1946/01/27  MRN: [id number]  [name] is enrolled in a chronic special needs plan for Diabetes. Reviewed and updated care plan.  Subjective: Client states that she does not remember how to use SCAT so she has been depending on friends and her nephew to take her shopping.  States she sometimes get low on food or she has food but does not want to eat what she has available.  States she saw her doctor a few weeks ago and her Hemoglobin A1C 7.6%.  States he also ordered home health for therapy for her stiff shoulder.  States she is still not checking her blood sugars as she has missed placed her meter and she does not like to stick her fingers.  States she usually has a late breakfast/lunch of an egg, toast and juice.  States she does like to eat candy sometimes.  States she had on fall with no injury when her legs gave out on her.  States she was using her walker.  State she did go to a Clinical research associate and had her Power of [employer] including health completed.  Goals Addressed            This Visit's Progress   . COMPLETED: Advanced Care Planning complete by next 9 months       Completed Advanced Directives       . Client understands the importance of follow-up with providers by attending scheduled visits   On track   . Client will not report change from baseline and no repeated symptoms of stroke with in the next 6 months(continue 05/07/19)   On track   . Client will report no fall or injuries in the next 6 months.(continue 05/07/19)   Not on track    Use walker at all times    . Client will verbalize knowledge of self management of Hypertension as evidences by BP reading of 140/90 or less; or as defined by provider   On track   . Maintain timely refills of diabetic medication as prescribed within the year .   On track   . COMPLETED: Obtain Annual Eye (retinal)  Exam       Completed 02/08/19    . COMPLETED: Obtain Hemoglobin A1C at least 2 times per year       Completed 4/2//20,12/06/18,04/26/19    . COMPLETED: Visit Primary Care Provider or Endocrinologist at least 2 times per year        Visits 09/26/18, 12/06/18, 01/22/19, 03/06/19, 04/26/19     Client is not meeting diabetes self management goal of hemoglobin A1C of <7% with last reading of 7.6% which is improved from 7.9% Discussed with client about having social work contact her again to answer her questions about SCAT and food resources  Informed client that she is eligible  for [facility] for complex care and discussed the benefits of [facility].  Client agreeable to having RNCM contact [facility] to have them contact her for services Reinforced to follow a low CHO low sodium diet and to try to limit the amount of candy she eats Encouraged to look for her glucometer and check her CBGs as directed by provider Reviewed number for 24-hour nurse Line Reviewed COVID 19 precautions  Plan:  Send successful outreach letter with a copy of their individualized care plan and Send individual care plan to provider  Chronic care management coordinator will outreach in:  3 Months  Will refer to:  Social Work for transportation questions and food resources, [employer] informed of client interest in their services   [place], [facility], [place] Management [phone]

## 2019-05-07 NOTE — Patient Outreach (Signed)
[facility]) Care Management  05/07/2019  Donora 13-Mar-1946 [id number]   Social work referral received from [employer], [employer]. "CSNP client who Cynthia Hunt helped in the past has questions about SCAT. She needs help with food resources. She also has questions about what is PACE"  SCAT-patient was certified for services in May but never received certification packet.  Sent secure message to [place] with eligibility requesting that another packet be sent.  PACE-patient was informed by friend about PACE program but given minimal information.  Talked with her about the specifics of the service including cost.  Service does not seem to be appropriate for patient and she cannot afford out-of-pocket cost.  Not eligible for [facility].   Food resources-Patient denied food insecurity.  Stated that she has difficulty getting around stores that do not have electric scooters.  Patient is seeking program that could assist with shopping.  Informed patient about [employer], however, they are currently not providing service due to [place].  Closing Social Work case but did provide patient with my contact information and encouraged her to call if additional needs/quesitons arise.  Ronn Melena, BSW Social Worker [phone]

## 2019-05-15 DIAGNOSIS — I10 Essential (primary) hypertension: Secondary | ICD-10-CM | POA: Diagnosis not present

## 2019-05-15 DIAGNOSIS — Z9181 History of falling: Secondary | ICD-10-CM | POA: Diagnosis not present

## 2019-05-15 DIAGNOSIS — G4700 Insomnia, unspecified: Secondary | ICD-10-CM | POA: Diagnosis not present

## 2019-05-15 DIAGNOSIS — I69354 Hemiplegia and hemiparesis following cerebral infarction affecting left non-dominant side: Secondary | ICD-10-CM | POA: Diagnosis not present

## 2019-05-15 DIAGNOSIS — F409 Phobic anxiety disorder, unspecified: Secondary | ICD-10-CM | POA: Diagnosis not present

## 2019-05-15 DIAGNOSIS — E785 Hyperlipidemia, unspecified: Secondary | ICD-10-CM | POA: Diagnosis not present

## 2019-05-15 DIAGNOSIS — H35033 Hypertensive retinopathy, bilateral: Secondary | ICD-10-CM | POA: Diagnosis not present

## 2019-05-15 DIAGNOSIS — E1142 Type 2 diabetes mellitus with diabetic polyneuropathy: Secondary | ICD-10-CM | POA: Diagnosis not present

## 2019-05-15 DIAGNOSIS — E113493 Type 2 diabetes mellitus with severe nonproliferative diabetic retinopathy without macular edema, bilateral: Secondary | ICD-10-CM | POA: Diagnosis not present

## 2019-05-15 DIAGNOSIS — M5030 Other cervical disc degeneration, unspecified cervical region: Secondary | ICD-10-CM | POA: Diagnosis not present

## 2019-05-15 DIAGNOSIS — F0390 Unspecified dementia without behavioral disturbance: Secondary | ICD-10-CM | POA: Diagnosis not present

## 2019-05-15 DIAGNOSIS — M5136 Other intervertebral disc degeneration, lumbar region: Secondary | ICD-10-CM | POA: Diagnosis not present

## 2019-05-15 DIAGNOSIS — Z87891 Personal history of nicotine dependence: Secondary | ICD-10-CM | POA: Diagnosis not present

## 2019-05-15 DIAGNOSIS — Z7984 Long term (current) use of oral hypoglycemic drugs: Secondary | ICD-10-CM | POA: Diagnosis not present

## 2019-05-15 DIAGNOSIS — I951 Orthostatic hypotension: Secondary | ICD-10-CM | POA: Diagnosis not present

## 2019-05-29 DIAGNOSIS — I10 Essential (primary) hypertension: Secondary | ICD-10-CM | POA: Diagnosis not present

## 2019-05-29 DIAGNOSIS — I69354 Hemiplegia and hemiparesis following cerebral infarction affecting left non-dominant side: Secondary | ICD-10-CM | POA: Diagnosis not present

## 2019-05-29 DIAGNOSIS — E785 Hyperlipidemia, unspecified: Secondary | ICD-10-CM | POA: Diagnosis not present

## 2019-05-29 DIAGNOSIS — E113493 Type 2 diabetes mellitus with severe nonproliferative diabetic retinopathy without macular edema, bilateral: Secondary | ICD-10-CM | POA: Diagnosis not present

## 2019-05-29 DIAGNOSIS — G4700 Insomnia, unspecified: Secondary | ICD-10-CM | POA: Diagnosis not present

## 2019-05-29 DIAGNOSIS — Z7984 Long term (current) use of oral hypoglycemic drugs: Secondary | ICD-10-CM | POA: Diagnosis not present

## 2019-05-29 DIAGNOSIS — M5030 Other cervical disc degeneration, unspecified cervical region: Secondary | ICD-10-CM | POA: Diagnosis not present

## 2019-05-29 DIAGNOSIS — H35033 Hypertensive retinopathy, bilateral: Secondary | ICD-10-CM | POA: Diagnosis not present

## 2019-05-29 DIAGNOSIS — F0390 Unspecified dementia without behavioral disturbance: Secondary | ICD-10-CM | POA: Diagnosis not present

## 2019-05-29 DIAGNOSIS — Z9181 History of falling: Secondary | ICD-10-CM | POA: Diagnosis not present

## 2019-05-29 DIAGNOSIS — E1142 Type 2 diabetes mellitus with diabetic polyneuropathy: Secondary | ICD-10-CM | POA: Diagnosis not present

## 2019-05-29 DIAGNOSIS — I951 Orthostatic hypotension: Secondary | ICD-10-CM | POA: Diagnosis not present

## 2019-05-29 DIAGNOSIS — M5136 Other intervertebral disc degeneration, lumbar region: Secondary | ICD-10-CM | POA: Diagnosis not present

## 2019-05-29 DIAGNOSIS — Z87891 Personal history of nicotine dependence: Secondary | ICD-10-CM | POA: Diagnosis not present

## 2019-05-29 DIAGNOSIS — F409 Phobic anxiety disorder, unspecified: Secondary | ICD-10-CM | POA: Diagnosis not present

## 2019-06-05 ENCOUNTER — Ambulatory Visit: Payer: HMO | Admitting: Internal Medicine

## 2019-06-27 DIAGNOSIS — I951 Orthostatic hypotension: Secondary | ICD-10-CM | POA: Diagnosis not present

## 2019-06-27 DIAGNOSIS — G4700 Insomnia, unspecified: Secondary | ICD-10-CM | POA: Diagnosis not present

## 2019-06-27 DIAGNOSIS — F409 Phobic anxiety disorder, unspecified: Secondary | ICD-10-CM | POA: Diagnosis not present

## 2019-06-27 DIAGNOSIS — I10 Essential (primary) hypertension: Secondary | ICD-10-CM | POA: Diagnosis not present

## 2019-06-27 DIAGNOSIS — E113493 Type 2 diabetes mellitus with severe nonproliferative diabetic retinopathy without macular edema, bilateral: Secondary | ICD-10-CM | POA: Diagnosis not present

## 2019-06-27 DIAGNOSIS — H35033 Hypertensive retinopathy, bilateral: Secondary | ICD-10-CM | POA: Diagnosis not present

## 2019-06-27 DIAGNOSIS — M5136 Other intervertebral disc degeneration, lumbar region: Secondary | ICD-10-CM | POA: Diagnosis not present

## 2019-06-27 DIAGNOSIS — M5030 Other cervical disc degeneration, unspecified cervical region: Secondary | ICD-10-CM | POA: Diagnosis not present

## 2019-06-27 DIAGNOSIS — E785 Hyperlipidemia, unspecified: Secondary | ICD-10-CM | POA: Diagnosis not present

## 2019-06-27 DIAGNOSIS — Z7984 Long term (current) use of oral hypoglycemic drugs: Secondary | ICD-10-CM | POA: Diagnosis not present

## 2019-06-27 DIAGNOSIS — F0390 Unspecified dementia without behavioral disturbance: Secondary | ICD-10-CM | POA: Diagnosis not present

## 2019-06-27 DIAGNOSIS — Z87891 Personal history of nicotine dependence: Secondary | ICD-10-CM | POA: Diagnosis not present

## 2019-06-27 DIAGNOSIS — Z9181 History of falling: Secondary | ICD-10-CM | POA: Diagnosis not present

## 2019-06-27 DIAGNOSIS — I69354 Hemiplegia and hemiparesis following cerebral infarction affecting left non-dominant side: Secondary | ICD-10-CM | POA: Diagnosis not present

## 2019-06-27 DIAGNOSIS — E1142 Type 2 diabetes mellitus with diabetic polyneuropathy: Secondary | ICD-10-CM | POA: Diagnosis not present

## 2019-07-09 ENCOUNTER — Ambulatory Visit: Payer: Self-pay

## 2019-07-10 DIAGNOSIS — E11319 Type 2 diabetes mellitus with unspecified diabetic retinopathy without macular edema: Secondary | ICD-10-CM | POA: Diagnosis not present

## 2019-07-10 DIAGNOSIS — Z79899 Other long term (current) drug therapy: Secondary | ICD-10-CM | POA: Diagnosis not present

## 2019-07-10 DIAGNOSIS — I10 Essential (primary) hypertension: Secondary | ICD-10-CM | POA: Diagnosis not present

## 2019-07-10 DIAGNOSIS — I69354 Hemiplegia and hemiparesis following cerebral infarction affecting left non-dominant side: Secondary | ICD-10-CM | POA: Diagnosis not present

## 2019-07-10 DIAGNOSIS — E7800 Pure hypercholesterolemia, unspecified: Secondary | ICD-10-CM | POA: Diagnosis not present

## 2019-07-10 DIAGNOSIS — E1142 Type 2 diabetes mellitus with diabetic polyneuropathy: Secondary | ICD-10-CM | POA: Diagnosis not present

## 2019-07-10 DIAGNOSIS — Z794 Long term (current) use of insulin: Secondary | ICD-10-CM | POA: Diagnosis not present

## 2019-07-30 DIAGNOSIS — M25512 Pain in left shoulder: Secondary | ICD-10-CM | POA: Diagnosis not present

## 2019-07-30 DIAGNOSIS — M19012 Primary osteoarthritis, left shoulder: Secondary | ICD-10-CM | POA: Diagnosis not present

## 2019-07-30 DIAGNOSIS — M24812 Other specific joint derangements of left shoulder, not elsewhere classified: Secondary | ICD-10-CM | POA: Diagnosis not present

## 2019-07-31 ENCOUNTER — Other Ambulatory Visit: Payer: Self-pay

## 2019-07-31 NOTE — Patient Outreach (Signed)
  [facility]) Care Management Chronic Special Needs Program  07/31/2019  Name: JEMIMA PETKO DOB: 1945-11-13  MRN: [id number]  Ms. Ruari Duggan is enrolled in a chronic special needs plan for Diabetes. Client called with no answer No answer and unable to leave  HIPAA compliant message as mailbox is full Plan for 2rd outreach call in one week Chronic care management coordinator will attempt outreach in one week.   Dudley Major RN, Maximiano Coss, CDE Chronic Care Management Coordinator [facility] Care Management [phone]

## 2019-08-07 ENCOUNTER — Other Ambulatory Visit: Payer: Self-pay

## 2019-08-07 NOTE — Patient Outreach (Signed)
[facility]) Care Management Chronic Special Needs Program  08/07/2019  Name: Cynthia Hunt DOB: [birth year]  MRN: [id number]  Ms. Nga Rabon is enrolled in a chronic special needs plan for Diabetes. Chronic Care Management Coordinator telephoned client to review health risk assessment and to develop individualized care plan.  Introduced the chronic care management program, importance of client participation, and taking their care plan to all provider appointments and inpatient facilities.  Reviewed the transition of care process and possible referral to community care management.  Subjective:Client states that she has a big packet from [employer] but she has not looked at it yet to see if it is her SCAT information.  States her family is taking her to appointments and doing her shopping for her now.  States she did see her doctor a month or so ago.  States she has not gone to the endocrinologist for a while and she plans to call to make an appointment with her.  States she does not want to stick her fingers to check her blood sugars.  States she tries to watch what she eats but she does like sweets.  States her B/P monitor is not working so she has not been checking her blood sugars.  States she has had one fall when she fell backwards.  Denies any injury.  States she does use her walker when she goes out but not in the house.  States she is taking her medications as ordered and has no problem with getting her refills or affording her medications  Goals Addressed            This Visit's Progress   . Client understands the importance of follow-up with providers by attending scheduled visits   On track    Plan to schedule appointments with providers Reinforced importance of scheduling and keeping appointments with providers     . Client will not report change from baseline and no repeated symptoms of stroke with in the next 6 months(continue 08/07/19)   On  track    Reviewed signs and symptoms of stroke and when to call 911 Sent EMMI: Signs of Stroke    . Client will report no fall or injuries in the next 6 months.(continue 08/07/19)   Not on track    Reported one fall Reviewed fall precautions and home safety Stand up slowly Use cane or walker at all times Use grabber to pick up objects on the floor Keep home well-lit and wear your glasses Send EMMI: Upper extremity exercises seated, Lower extremity exercises seated    . Client will verbalize knowledge of self management of Hypertension as evidences by BP reading of 140/90 or less; or as defined by provider   On track    Reports B/P below 140/90 when checked at providers and taking medications  Plan to send new B/P monitor Plan to check B/P regularly Take B/P medications as ordered Plan to follow a low salt diet  Increase activity as tolerated    . HEMOGLOBIN A1C < 7 (pt-stated)       Last Hemoglobin A1C 7.6% on 04/26/19 Reviewed importance of checking blood sugars and to take her meter and scanner with her on her next provider visit Reinforced to follow a low CHO diet and to avoid sweets Encouraged to increase activity as tolerated    . Maintain timely refills of diabetic medication as prescribed within the year .   On track    Maintaining timely refills of  medications per dispense report Reinforced importance of getting medications refilled on time    . Obtain annual  Lipid Profile, LDL-C   On track    Completed 10/23/18 LDL 81 The goal for LDL is less than 70 mg/dL as you are at high risk for complications Try to avoid saturated fats, trans-fats and eat more fiber    . Obtain Annual Eye (retinal)  Exam    On track    Completed 02/08/19 Plan to have a dilated eye exam every year    . Obtain Annual Foot Exam   On track    Completed 09/06/18 Check your skin and feet every day for cuts, bruises, redness, blisters, or sores. Schedule a foot exam with your health care provider once  every year    . Obtain annual screen for micro albuminuria (urine) , nephropathy (kidney problems)   On track    Completed 09/06/18 It is important for your doctor to check your urine for protein at least every year    . Obtain Hemoglobin A1C at least 2 times per year   On track    Completed 4/2//20,12/06/18,04/26/19 It is important to have your Hemoglobin A1C checked every 6 months if you are at goal and every 3 months if you are not at goal     . Visit Primary Care Provider or Endocrinologist at least 2 times per year    On track    Primary care visit- 07/10/19  Please schedule your annual wellness visit     Client is not meeting diabetes self management goal of hemoglobin A1C of <7% with last reading of 7.6% Client has worked with [facility] worker for transportation and food resources Client currently is receiving assistance from nephew and other family members with transportation and shopping Reviewed number for [place] 19 precautions Plan:  Send successful outreach letter with a copy of their individualized care plan, Send individual care plan to provider and Send educational material EMMI:Signs of Stroke, chair exercises send B/P Monitor  Chronic care management coordination will outreach in:  3 Months     [place], [employer], [place] [facility] [phone]

## 2019-09-10 DIAGNOSIS — E7800 Pure hypercholesterolemia, unspecified: Secondary | ICD-10-CM | POA: Diagnosis not present

## 2019-09-10 DIAGNOSIS — E113493 Type 2 diabetes mellitus with severe nonproliferative diabetic retinopathy without macular edema, bilateral: Secondary | ICD-10-CM | POA: Diagnosis not present

## 2019-09-10 DIAGNOSIS — Z79899 Other long term (current) drug therapy: Secondary | ICD-10-CM | POA: Diagnosis not present

## 2019-09-10 DIAGNOSIS — I10 Essential (primary) hypertension: Secondary | ICD-10-CM | POA: Diagnosis not present

## 2019-09-10 DIAGNOSIS — R159 Full incontinence of feces: Secondary | ICD-10-CM | POA: Diagnosis not present

## 2019-09-10 DIAGNOSIS — I69354 Hemiplegia and hemiparesis following cerebral infarction affecting left non-dominant side: Secondary | ICD-10-CM | POA: Diagnosis not present

## 2019-09-10 DIAGNOSIS — Z0001 Encounter for general adult medical examination with abnormal findings: Secondary | ICD-10-CM | POA: Diagnosis not present

## 2019-10-17 ENCOUNTER — Other Ambulatory Visit: Payer: Self-pay

## 2019-10-17 NOTE — Patient Outreach (Signed)
  [facility]) Care Management Chronic Special Needs Program   10/17/2019  Name: Cynthia Hunt, DOB: 03/26/1946  MRN: [id number]  The client was discussed in today's interdisciplinary care team meeting.  The following issues were discussed:  Client's needs, Key risk triggers/risk stratification, Care Plan, Coordination of care and Issues/barriers to care  Participants present:   Livia Snellen, RN, BSN, MS, CCM  George Ina, BSN, CCM   [employer], BSN, CCM, CDE  Kathyrn Sheriff, BSN, MSN, CCM  Irving Shows, BSN, CCM   Iverson Alamin, CBCC/ CMAA   Dr. Charlott Rakes   Mosie Lukes, RD, LDN ([facility])  Tommye Standard, Pharm D ([facility]A)  Dessa Phi, RN ([facility])   Lenise Herald, RN ([facility]  Recommendations:  [facility] to do initial visit in 10/19/19.  [facility] to assess clients home environment and support system  Plan:  Plan to continue to provide diabetes self management.  [facility] to become engaged  Follow-up:  RNCM to follow up per tier level and will collaborate with [facility] on clients needs  Dudley Major RN, Maximiano Coss, CDE Chronic Care Management Coordinator [facility] Care Management [phone]

## 2019-10-30 ENCOUNTER — Other Ambulatory Visit: Payer: Self-pay

## 2019-10-30 NOTE — Patient Outreach (Signed)
[facility]) Care Management Chronic Special Needs Program  10/30/2019  Name: Cynthia Hunt DOB: Oct 26, 1945  MRN: [id number]  [name] is enrolled in a chronic special needs plan for Diabetes. Reviewed and updated care plan.  Subjective: Client states that she has the [facility] team visiting her.  States that someone came out and completed her SCAT from and took it to the doctor to get signed.  States she is trying to eat healthy with vegetables but she does like sweets such as ice cream and cookies when she has them.  States she has not been checking her blood sugars as it makes her fingers sore.  States she got the B/P machine but she has not been checking her B/P.  Denies any recent falls and states she uses her walker if she is going out.  States her nephew is coming twice a week to help her around the house and shopping.  States she also has friends who take her shopping sometimes.  Denies any issues getting or affording her medications.  States she is not sure which statin drug she should take   Goals Addressed            This Visit's Progress   . Client understands the importance of follow-up with providers by attending scheduled visits   On track    Plan to schedule appointments with providers Reinforced importance of scheduling and keeping appointments with providers     . Client will not report change from baseline and no repeated symptoms of stroke with in the next 6 months(continue 08/07/19)   On track    Reinforced signs and symptoms of stroke(FAST) and when to call 911     . Client will report no fall or injuries in the next 6 months.(continue 08/07/19)   On track    Reports no falls  Reinforced fall precautions and home safety Stand up slowly Use cane or walker at all times Use grabber to pick up objects on the floor Keep home well-lit and wear your glasses     . Client will verbalize knowledge of self management of Hypertension as evidences  by BP reading of 140/90 or less; or as defined by provider   On track    Reports B/P below 140/90 when checked at providers and taking medications  Reports receiving B/P monitor that was mailed to her Plan to check B/P at least one time a week or more as needed Please let your doctor know if readings stay above 140/90  Take B/P medications as ordered Plan to follow a low salt diet  Increase activity as tolerated    . HEMOGLOBIN A1C < 7 (pt-stated)       Last Hemoglobin A1C 7.6% on 04/26/19 Reinforced importance of trying to  check blood sugars and to take her meter and scanner with her on her next provider visit Reinforced to follow a low CHO diet and to avoid sweets Encouraged to increase activity as tolerated Reviewed signs and symptoms of hypoglycemia and actions to take    . COMPLETED: Maintain timely refills of diabetic medication as prescribed within the year .   On track    Maintaining timely refills of medications per dispense report Reinforced importance of getting medications refilled on time Goal completed 10/30/19    . Obtain annual  Lipid Profile, LDL-C   On track    Completed 10/23/18 LDL 81 Called Dr.Koirala's office to clarify clients statin medication and instructed client RNCM will  contact her when to clarify which statin -Simvastatin or Atorvastatin  The goal for LDL is less than 70 mg/dL as you are at high risk for complications Try to avoid saturated fats, trans-fats and eat more fiber    . Obtain Annual Eye (retinal)  Exam    On track    Last completed 02/08/19 Plan to have a dilated eye exam every year    . Obtain Annual Foot Exam   On track    Completed 09/06/18 Reports no sores or open areas on her feet Check your skin and feet every day for cuts, bruises, redness, blisters, or sores. Schedule a foot exam with your health care provider once every year    . Obtain annual screen for micro albuminuria (urine) , nephropathy (kidney problems)   On track    Last  completed 09/06/18 It is important for your doctor to check your urine for protein at least every year    . Obtain Hemoglobin A1C at least 2 times per year   On track    Last completed 04/26/19 It is important to have your Hemoglobin A1C checked every 6 months if you are at goal and every 3 months if you are not at goal     . Visit Primary Care Provider or Endocrinologist at least 2 times per year    On track    Primary care visit- 07/10/19, 09/10/19 Please schedule your annual wellness visit     Client is not meeting diabetes self management goal of hemoglobin A1C of <7% with last reading of 7.5% Client now engaged with [facility] for complex management. Next [facility] provider visit scheduled on 11/16/19.   Called Dr.Koirala's office and left message to clarify statin and dosage.  Plan to notify client when medication clarified  Plan:  Send successful outreach letter with a copy of their individualized care plan and Send individual care plan to provider  Chronic care management coordinator will review [facility]'s plan of care and will collaborate with [facility] team as indicated in 3 Months  Chronic care management coordinator will outreach in:  3 Months     Cynthia Garter RN, [facility], [place] Management Coordinator [place] Management [phone]

## 2019-10-31 ENCOUNTER — Other Ambulatory Visit: Payer: Self-pay

## 2019-10-31 ENCOUNTER — Ambulatory Visit: Payer: Self-pay

## 2019-10-31 NOTE — Patient Outreach (Signed)
  [facility]) Care Management Chronic Special Needs Program    10/31/2019  Name: Cynthia Hunt, DOB: 1946/06/04  MRN: [id number]   [name] is enrolled in a chronic special needs plan for Diabetes. Received call from Dr. Glendale Chard office about message left to clarify which statin client should be taking.  States that Dr.Koirala is out of the office today and they will get clarified tomorrow as it is not clear in their system which statin she should be taking.  States that they will contact RNCM when the medication is clarified. Plan to await return call from Dr.Koirala and will update client   Dudley Major RN, [facility], CDE Chronic Care Management Coordinator [facility] Care Management [phone]

## 2019-11-01 ENCOUNTER — Other Ambulatory Visit: Payer: Self-pay

## 2019-11-01 NOTE — Patient Outreach (Signed)
  [facility]) Care Management Chronic Special Needs Program    11/01/2019  Name: Cynthia Hunt, DOB: 1946-06-12  MRN: [id number]   [name] is enrolled in a chronic special needs plan for Diabetes. Received call from Dr.Koiala's office with clarification of clients statin.  Client is to discontinue the simvastatin and to take atorvastatin 40 mg every day. Called client to inform her of clarification.  HIPAA verified Instructed client to stop taking Simvastatin and to take the atorvastatin 40 mg every day. Client verbalized understanding of instructions Plan to follow up with client as scheduled per tier level Dudley Major RN, [facility], CDE Chronic Care Management Coordinator [facility] Care Management [phone]

## 2019-11-05 ENCOUNTER — Ambulatory Visit: Payer: Self-pay

## 2019-11-06 DIAGNOSIS — E11319 Type 2 diabetes mellitus with unspecified diabetic retinopathy without macular edema: Secondary | ICD-10-CM | POA: Diagnosis not present

## 2019-11-06 DIAGNOSIS — M5136 Other intervertebral disc degeneration, lumbar region: Secondary | ICD-10-CM | POA: Diagnosis not present

## 2019-11-06 DIAGNOSIS — H35033 Hypertensive retinopathy, bilateral: Secondary | ICD-10-CM | POA: Diagnosis not present

## 2019-11-06 DIAGNOSIS — I10 Essential (primary) hypertension: Secondary | ICD-10-CM | POA: Diagnosis not present

## 2019-11-06 DIAGNOSIS — E7800 Pure hypercholesterolemia, unspecified: Secondary | ICD-10-CM | POA: Diagnosis not present

## 2019-11-06 DIAGNOSIS — E113493 Type 2 diabetes mellitus with severe nonproliferative diabetic retinopathy without macular edema, bilateral: Secondary | ICD-10-CM | POA: Diagnosis not present

## 2019-11-06 DIAGNOSIS — Z7984 Long term (current) use of oral hypoglycemic drugs: Secondary | ICD-10-CM | POA: Diagnosis not present

## 2019-11-06 DIAGNOSIS — I69354 Hemiplegia and hemiparesis following cerebral infarction affecting left non-dominant side: Secondary | ICD-10-CM | POA: Diagnosis not present

## 2019-11-06 DIAGNOSIS — Z79899 Other long term (current) drug therapy: Secondary | ICD-10-CM | POA: Diagnosis not present

## 2019-11-06 DIAGNOSIS — E1142 Type 2 diabetes mellitus with diabetic polyneuropathy: Secondary | ICD-10-CM | POA: Diagnosis not present

## 2019-11-06 DIAGNOSIS — F409 Phobic anxiety disorder, unspecified: Secondary | ICD-10-CM | POA: Diagnosis not present

## 2019-11-22 DIAGNOSIS — N189 Chronic kidney disease, unspecified: Secondary | ICD-10-CM | POA: Diagnosis not present

## 2019-12-24 DIAGNOSIS — M2560 Stiffness of unspecified joint, not elsewhere classified: Secondary | ICD-10-CM | POA: Diagnosis not present

## 2019-12-24 DIAGNOSIS — M47892 Other spondylosis, cervical region: Secondary | ICD-10-CM | POA: Diagnosis not present

## 2019-12-24 DIAGNOSIS — M25612 Stiffness of left shoulder, not elsewhere classified: Secondary | ICD-10-CM | POA: Diagnosis not present

## 2020-01-02 DIAGNOSIS — M47892 Other spondylosis, cervical region: Secondary | ICD-10-CM | POA: Diagnosis not present

## 2020-01-02 DIAGNOSIS — M2560 Stiffness of unspecified joint, not elsewhere classified: Secondary | ICD-10-CM | POA: Diagnosis not present

## 2020-01-02 DIAGNOSIS — M25612 Stiffness of left shoulder, not elsewhere classified: Secondary | ICD-10-CM | POA: Diagnosis not present

## 2020-01-07 DIAGNOSIS — M2560 Stiffness of unspecified joint, not elsewhere classified: Secondary | ICD-10-CM | POA: Diagnosis not present

## 2020-01-07 DIAGNOSIS — M47892 Other spondylosis, cervical region: Secondary | ICD-10-CM | POA: Diagnosis not present

## 2020-01-07 DIAGNOSIS — M25612 Stiffness of left shoulder, not elsewhere classified: Secondary | ICD-10-CM | POA: Diagnosis not present

## 2020-01-14 DIAGNOSIS — M25612 Stiffness of left shoulder, not elsewhere classified: Secondary | ICD-10-CM | POA: Diagnosis not present

## 2020-01-14 DIAGNOSIS — M47892 Other spondylosis, cervical region: Secondary | ICD-10-CM | POA: Diagnosis not present

## 2020-01-14 DIAGNOSIS — M2560 Stiffness of unspecified joint, not elsewhere classified: Secondary | ICD-10-CM | POA: Diagnosis not present

## 2020-01-21 ENCOUNTER — Other Ambulatory Visit: Payer: Self-pay

## 2020-01-21 NOTE — Patient Outreach (Signed)
[facility]) Care Management Chronic Special Needs Program  01/21/2020  Name: Cynthia Hunt DOB: [birth year]  MRN: [id number]  Cynthia Hunt is enrolled in a chronic special needs plan for Diabetes. Reviewed and updated care plan.  Subjective: Client states that the [facility] team been calling her and also has been visiting her.  States she still does not check her blood sugars as it hurts her fingers. States she tries to eat more vegetables and she is eating fewer sweets.  States she has not been checking her B/P and she does have the B/P monitor that was sent to her.  States she now has SCAT and she has used it to go out.  States her nephew is still helping her with shopping and cleaning around the house.  States she also has friends that take her shopping is needed.  States she has had one fall in the last few months.  States she tripped on her cane.  Denies any injury with the fall.  States she is taking atorvastatin now.  Denies any issues with getting or affording her medications  Goals Addressed              This Visit's Progress   .  Client understands the importance of follow-up with providers by attending scheduled visits   On track     Plan to schedule appointments with providers Plan to use SCAT to get to appointments Reinforced importance of scheduling and keeping appointments with providers     .  Client will not report change from baseline and no repeated symptoms of stroke with in the next 6 months(continue 01/21/20)   On track     Know the signs and symptoms to prevent another stroke: Facial drooping (unable to smile), Arm weakness (one arm week or numb), Speech is difficult (slurred, unable to speak). Call 911, this is an emergency! Get to a hospital immediately     .  Client will report no fall or injuries in the next 6 months.(continue 01/21/20)   Not on track     Reports one fall with no injury Plan to wear your Life Alert button at all times    Reinforced fall precautions and home safety Stand up slowly Use cane or walker at all times Use grabber to pick up objects on the floor Keep home well-lit and wear your glasses     .  Client will verbalize knowledge of self management of Hypertension as evidences by BP reading of 140/90 or less; or as defined by provider   On track     Reports B/P varies  when checked at providers and taking medications  Client has B/P monitor and report she can use it Plan to check B/P at least one time a week or more as needed Please let your doctor know if readings stay above 140/90  Take B/P medications as ordered Plan to follow a low salt diet  Increase activity as tolerated    .  HEMOGLOBIN A1C < 7 (pt-stated)        Last Hemoglobin A1C 7.7% on 11/06/19 Reinforced importance of trying to  check blood sugars  Reinforced to follow a low CHO diet and to avoid sweets Reviewed signs and symptoms of hypoglycemia and actions to take Follow Plate Method: (create a balanced plate): . 1/2 plate vegetables (broccoli, cauliflower, carrots, brussels sprouts, turnip greens), Aim for 1 cup cooked or 2 cups raw . 1/4 starch (ex- pasta, rice, roll, bread), Aim  for 1/2-1 cup, Choose whole grains whenever possible . 1/4 protein (ex- chicken, beef, pork & all beans), Aim for 1 palm size or 1/2 cup, choose lean protein whenever possible and limit processed meats . +1 healthy fat (ex- olive oil, avocado, nuts, seeds, dressing, mayonnaise), Aim for 2-3 teaspoons    .  COMPLETED: Obtain annual  Lipid Profile, LDL-C   On track     Completed 11/06/19 LDL 91 The goal for LDL is less than 70 mg/dL as you are at high risk for complications Try to avoid saturated fats, trans-fats and eat more fiber Plan to take atorvastatin as ordered  Goal completed 01/21/20    .  Obtain Annual Eye (retinal)  Exam    On track     Last completed 02/08/19 Plan to have a dilated eye exam every year    .  Obtain Annual Foot Exam   On track      Completed 09/06/18 Reports no sores or open areas on her feet Reinforced to check your skin and feet every day for cuts, bruises, redness, blisters, or sores. Schedule a foot exam with your health care provider once every year    .  COMPLETED: Obtain annual screen for micro albuminuria (urine) , nephropathy (kidney problems)   On track     Last completed 11/06/19 It is important for your doctor to check your urine for protein at least every year Goal completed 01/21/20    .  Obtain Hemoglobin A1C at least 2 times per year   On track     Last completed 11/06/19 It is important to have your Hemoglobin A1C checked every 6 months if you are at goal and every 3 months if you are not at goal     .  Visit Primary Care Provider or Endocrinologist at least 2 times per year    On track     Primary care visit- 07/10/19, 09/10/19, 11/06/19 [facility] provider 10/19/19,11/11/19, 12/25/19 nest scheduled visit 04/22/20 Please schedule your annual wellness visit       Plan:  Send successful outreach letter with a copy of their individualized care plan and Send individual care plan to provider  Chronic care management coordinator will outreach in:  3 Months     Dudley Major RN, [place], CDE Chronic Care Management Coordinator [facility] Care Management [phone]

## 2020-01-22 ENCOUNTER — Ambulatory Visit: Payer: Self-pay

## 2020-01-29 DIAGNOSIS — M2560 Stiffness of unspecified joint, not elsewhere classified: Secondary | ICD-10-CM | POA: Diagnosis not present

## 2020-01-29 DIAGNOSIS — M25612 Stiffness of left shoulder, not elsewhere classified: Secondary | ICD-10-CM | POA: Diagnosis not present

## 2020-01-29 DIAGNOSIS — M47892 Other spondylosis, cervical region: Secondary | ICD-10-CM | POA: Diagnosis not present

## 2020-01-31 DIAGNOSIS — E1142 Type 2 diabetes mellitus with diabetic polyneuropathy: Secondary | ICD-10-CM | POA: Diagnosis not present

## 2020-01-31 DIAGNOSIS — I10 Essential (primary) hypertension: Secondary | ICD-10-CM | POA: Diagnosis not present

## 2020-01-31 DIAGNOSIS — E11319 Type 2 diabetes mellitus with unspecified diabetic retinopathy without macular edema: Secondary | ICD-10-CM | POA: Diagnosis not present

## 2020-01-31 DIAGNOSIS — G4700 Insomnia, unspecified: Secondary | ICD-10-CM | POA: Diagnosis not present

## 2020-01-31 DIAGNOSIS — Z79899 Other long term (current) drug therapy: Secondary | ICD-10-CM | POA: Diagnosis not present

## 2020-01-31 DIAGNOSIS — E7800 Pure hypercholesterolemia, unspecified: Secondary | ICD-10-CM | POA: Diagnosis not present

## 2020-02-05 ENCOUNTER — Other Ambulatory Visit: Payer: Self-pay | Admitting: Internal Medicine

## 2020-02-21 DIAGNOSIS — Z7689 Persons encountering health services in other specified circumstances: Secondary | ICD-10-CM | POA: Diagnosis not present

## 2020-04-04 ENCOUNTER — Other Ambulatory Visit: Payer: Self-pay

## 2020-04-04 NOTE — Patient Outreach (Signed)
[facility]) Care Management Chronic Special Needs Program  04/04/2020  Name: Cynthia Hunt DOB: 24-Feb-1946  MRN: [id number]  Cynthia Hunt is enrolled in a chronic special needs plan for Diabetes. Reviewed and updated care plan.  Subjective: Client states that she has been doing good.  States that her doctors office told her her diabetes was doing good at her last appointment.  States she still does not check her blood sugars as it hurts her fingers.  Denies any feelings of low blood sugars.  States she is trying to eat less junk and likes to eat vegetables with her meals.  Denies any falls.  States her cousin is paying for [employer] to come clean her home and she no longer has things scattered on the floor to trip over.  States she uses her can or walker when she goes out.  States she is using SCAT to go out and to appointments.  States the [facility] has worked with her and she is on the waiting list for [employer].  States she has not been checking her B/P.  States she plans to call to make her eye appointment.  States that [facility] tired to check her eyes but their machine did not work.  States she has gotten both of her COVID shots(Moderna).  States she has not gotten her Flu shot yet.  Goals Addressed              This Visit's Progress   .  COMPLETED: Client understands the importance of follow-up with providers by attending scheduled visits   On track     Reports keeping scheduled appointments Plan to schedule appointments with providers Plan to use SCAT to get to appointments Reinforced importance of scheduling and keeping appointments with providers Goal completed 04/04/20    .  Client will not report change from baseline and no repeated symptoms of stroke with in the next 6 months(continue 01/21/20)   On track     No reports of stroke symptoms Know the signs and symptoms to prevent another stroke: Facial drooping (unable to smile), Arm weakness (one arm  week or numb), Speech is difficult (slurred, unable to speak). Call 911, this is an emergency! Get to a hospital immediately     .  Client will report no fall or injuries in the next 6 months.(continue 01/21/20)   On track     Reports no falls in last 3 months Plan to wear your Life Alert button at all times  Reinforced fall precautions and home safety Stand up slowly Use cane or walker at all times Use grabber to pick up objects on the floor Keep home well-lit and wear your glasses     .  Client will verbalize knowledge of self management of Hypertension as evidences by BP reading of 140/90 or less; or as defined by provider   No change     Reports B/P still varies when checked at providers and taking medications  160/86 at last provider visit Client has B/P monitor and report she can use it. Encouraged to check B/P at home Plan to check B/P at least one time a week or more as needed Please let your doctor know if readings stay above 140/90  Take B/P medications as ordered Plan to follow a low salt diet  Increase activity as tolerated    .  HEMOGLOBIN A1C < 7 (pt-stated)        Last Hemoglobin A1C 5.9% on 01/31/20  Reinforced importance of trying to  check blood sugars  Reinforced to follow a low CHO diet and to avoid sweets Reviewed signs and symptoms of hypoglycemia and actions to take Reviewed importance of getting annual Flu shot Reinforced to follow Plate Method: (create a balanced plate): 1/2 plate vegetables (broccoli, cauliflower, carrots, brussels sprouts, turnip greens), Aim for 1 cup cooked  or 2 cups raw 1/4 starch (ex- pasta, rice, roll, bread), Aim for 1/2-1 cup, Choose whole grains whenever possible 1/4 protein (ex- chicken, beef, pork & all beans), Aim for 1 palm size or 1/2 cup, choose lean protein whenever possible and limit processed meats +1 healthy fat (ex- olive oil, avocado, nuts, seeds, dressing, mayonnaise), Aim for 2-3 teaspoons    .  Obtain Annual Eye  (retinal)  Exam    On track     Last completed 02/08/19 Plan to have a dilated eye exam every year Reinforced to call and schedule eye exam for this year    .  Obtain Annual Foot Exam   On track     Completed 09/06/18, Reports [facility] completed foot exam Reports no sores or open areas on her feet Reinforced to check your skin and feet every day for cuts, bruises, redness, blisters, or sores. Schedule a foot exam with your health care provider once every year    .  COMPLETED: Obtain Hemoglobin A1C at least 2 times per year   On track     Last completed 11/06/19, 01/31/20 It is important to have your Hemoglobin A1C checked every 6 months if you are at goal and every 3 months if you are not at goal Goal completed 04/04/20    .  Visit Primary Care Provider or Endocrinologist at least 2 times per year    On track     Primary care visit- 07/10/19, 09/10/19, 11/06/19, 01/31/20 [facility] provider 10/19/19,11/11/19, 12/25/19 next scheduled visit 04/22/20 Annual Wellness visit 09/10/19 Please schedule your annual wellness visit       Plan:  Send successful outreach letter with a copy of their individualized care plan and Send individual care plan to provider  Client will be outreached by a [facility] [employer] ([facility]) RNCM in 3 months per tier level             [facility] Case Management Team will follow member moving forward with assessments, care plans and documentation  Dudley Major RN, BSN,CCM, CDE Chronic Care Management Coordinator [facility] Care Management [phone]

## 2020-04-14 ENCOUNTER — Ambulatory Visit: Payer: Self-pay

## 2020-05-30 ENCOUNTER — Other Ambulatory Visit: Payer: Self-pay

## 2020-05-30 NOTE — Patient Outreach (Signed)
  [facility]) Care Management Chronic Special Needs Program    05/30/2020  Name: Cynthia Hunt, DOB: 1946-02-17  MRN: [id number]   [name] is enrolled in a chronic special needs plan for Diabetes.  [employer] Care Management Team has assumed care and services for this member. Case closed by [facility] Care Management  Dudley Major RN, [facility], CDE Chronic Care Management Coordinator [facility] Care Management [phone]

## 2020-07-31 ENCOUNTER — Emergency Department (HOSPITAL_COMMUNITY): Payer: Medicare Other

## 2020-07-31 ENCOUNTER — Other Ambulatory Visit: Payer: Self-pay

## 2020-07-31 ENCOUNTER — Encounter (HOSPITAL_COMMUNITY): Payer: Self-pay | Admitting: Emergency Medicine

## 2020-07-31 ENCOUNTER — Emergency Department (HOSPITAL_COMMUNITY)
Admission: EM | Admit: 2020-07-31 | Discharge: 2020-08-01 | Disposition: A | Discharge status: Home / Self Care | Payer: Medicare Other | Attending: Emergency Medicine | Admitting: Emergency Medicine

## 2020-07-31 DIAGNOSIS — Z7982 Long term (current) use of aspirin: Secondary | ICD-10-CM | POA: Diagnosis not present

## 2020-07-31 DIAGNOSIS — R29898 Other symptoms and signs involving the musculoskeletal system: Secondary | ICD-10-CM | POA: Diagnosis not present

## 2020-07-31 DIAGNOSIS — E11319 Type 2 diabetes mellitus with unspecified diabetic retinopathy without macular edema: Secondary | ICD-10-CM | POA: Diagnosis not present

## 2020-07-31 DIAGNOSIS — Z7984 Long term (current) use of oral hypoglycemic drugs: Secondary | ICD-10-CM | POA: Insufficient documentation

## 2020-07-31 DIAGNOSIS — E119 Type 2 diabetes mellitus without complications: Secondary | ICD-10-CM | POA: Diagnosis not present

## 2020-07-31 DIAGNOSIS — Z87891 Personal history of nicotine dependence: Secondary | ICD-10-CM | POA: Diagnosis not present

## 2020-07-31 DIAGNOSIS — E876 Hypokalemia: Secondary | ICD-10-CM | POA: Diagnosis not present

## 2020-07-31 DIAGNOSIS — I10 Essential (primary) hypertension: Secondary | ICD-10-CM | POA: Diagnosis not present

## 2020-07-31 DIAGNOSIS — R000 Tachycardia, unspecified: Secondary | ICD-10-CM | POA: Diagnosis not present

## 2020-07-31 DIAGNOSIS — Z79899 Other long term (current) drug therapy: Secondary | ICD-10-CM | POA: Diagnosis not present

## 2020-07-31 DIAGNOSIS — E1142 Type 2 diabetes mellitus with diabetic polyneuropathy: Secondary | ICD-10-CM | POA: Diagnosis not present

## 2020-07-31 DIAGNOSIS — R296 Repeated falls: Secondary | ICD-10-CM | POA: Diagnosis not present

## 2020-07-31 DIAGNOSIS — M533 Sacrococcygeal disorders, not elsewhere classified: Secondary | ICD-10-CM | POA: Insufficient documentation

## 2020-07-31 LAB — BASIC METABOLIC PANEL
Anion gap: 14 (ref 5–15)
BUN: 29 mg/dL — ABNORMAL HIGH (ref 8–23)
CO2: 30 mmol/L (ref 22–32)
Calcium: 8.3 mg/dL — ABNORMAL LOW (ref 8.9–10.3)
Chloride: 96 mmol/L — ABNORMAL LOW (ref 98–111)
Creatinine, Ser: 1.52 mg/dL — ABNORMAL HIGH (ref 0.44–1.00)
GFR, Estimated: 36 mL/min — ABNORMAL LOW (ref >60)
Glucose, Bld: 166 mg/dL — ABNORMAL HIGH (ref 70–99)
Potassium: 3.3 mmol/L — ABNORMAL LOW (ref 3.5–5.1)
Sodium: 140 mmol/L (ref 135–145)

## 2020-07-31 NOTE — ED Triage Notes (Signed)
Patient states the dr office called her and told her to come the ER because her K+ is low.

## 2020-07-31 NOTE — ED Notes (Signed)
Attempted lab draw x 2 but unsuccessful, RN, Elsie Ra made aware.

## 2020-07-31 NOTE — ED Provider Notes (Addendum)
[facility]-EMERGENCY DEPT Provider Note: Cynthia Dell, MD, FACEP  CSN: [id number] MRN: [id number] ARRIVAL: 07/31/20 at 2044 ROOM: WA11/WA11   CHIEF COMPLAINT  low potassium   HISTORY OF PRESENT ILLNESS  07/31/20 11:08 PM Cynthia Hunt is a 75 y.o. female who is here because [facility], her PCP, called her this evening and told her to go to the ED because something was low.  Per triage notes it is her potassium but a value was not given.  Her only complaint to me is that she fell several days ago and injured her coccyx.  She is having moderate pain in her coccyx when she sits certain ways.  She denies any nausea, vomiting or abdominal pain.   Past Medical History:  Diagnosis Date  . Depression   . Diabetes mellitus without complication (HCC)   . Hyperlipidemia   . Hypertension     Past Surgical History:  Procedure Laterality Date  . BUBBLE STUDY  10/24/2018   Procedure: BUBBLE STUDY;  Surgeon: Jake Bathe, MD;  Location: [facility] ENDOSCOPY;  Service: Cardiovascular;;  . TEE WITHOUT CARDIOVERSION N/A 10/24/2018   Procedure: TRANSESOPHAGEAL ECHOCARDIOGRAM (TEE);  Surgeon: Jake Bathe, MD;  Location: [facility] ENDOSCOPY;  Service: Cardiovascular;  Laterality: N/A;  . UTERINE FIBROID SURGERY      Family History  Problem Relation Age of Onset  . Stroke Mother     Social History   Tobacco Use  . Smoking status: Former Smoker    Quit date: 03/18/2006    Years since quitting: 14.3  . Smokeless tobacco: Never Used  Substance Use Topics  . Alcohol use: Yes    Comment: occasionally when out to eat  . Drug use: Not Currently    Types: Marijuana    Comment: pt reports using years ago    Prior to Admission medications   Medication Sig Start Date End Date Taking? Authorizing Provider  aspirin EC 81 MG EC tablet Take 1 tablet (81 mg total) by mouth daily. 10/26/18  Yes Zannie Cove, MD  atorvastatin (LIPITOR) 40 MG tablet Take 1 tablet (40 mg total) by mouth daily at 6 PM. 11/01/18   Yes Angiulli, Mcarthur Rossetti, PA-C  gabapentin (NEURONTIN) 300 MG capsule Take 600 mg by mouth 2 (two) times daily. 06/14/20  Yes [provider]  losartan (COZAAR) 100 MG tablet Take 1 tablet (100 mg total) by mouth daily. Patient taking differently: Take 50 mg by mouth daily. 11/01/18  Yes Angiulli, Mcarthur Rossetti, PA-C  magnesium 30 MG tablet Take 30 mg by mouth daily.   Yes [provider]  metFORMIN (GLUCOPHAGE) 1000 MG tablet Take 1 tablet (1,000 mg total) by mouth daily with breakfast. 01/22/19  Yes Shamleffer, Konrad Dolores, MD  Multiple Vitamin (MULTIVITAMIN WITH MINERALS) TABS tablet Take 1 tablet by mouth daily.   Yes [provider]  traZODone (DESYREL) 100 MG tablet Take 1 tablet (100 mg total) by mouth at bedtime. 11/01/18  Yes Angiulli, Mcarthur Rossetti, PA-C  glucose blood test strip Use as instructed to test blood sugar 2 times daily DX E11.65 01/22/19   Shamleffer, Konrad Dolores, MD  Lancets (FREESTYLE) lancets As directed 11/01/18   Angiulli, Mcarthur Rossetti, PA-C    Allergies Other and Penicillins   REVIEW OF SYSTEMS  Negative except as noted here or in the History of Present Illness.   PHYSICAL EXAMINATION  Initial Vital Signs Blood pressure (!) 165/80, pulse 94, temperature 98.8 F (37.1 C), temperature source Oral, resp. rate (!) 23,  SpO2 100 %.  Examination General: Well-developed, well-nourished female in no acute distress; appearance consistent with age of record HENT: normocephalic; atraumatic Eyes: pupils equal, round and reactive to light; extraocular muscles intact Neck: supple Heart: regular rate and rhythm; occasional ectopy Lungs: clear to auscultation bilaterally Abdomen: soft; nondistended; nontender; bowel sounds present Back: Coccyx tender Extremities: No deformity; full range of motion; pulses normal Neurologic: Awake, alert; motor function intact in all extremities and symmetric; no facial droop Skin: Warm and dry Psychiatric: Normal mood and  affect   RESULTS  Summary of this visit's results, reviewed and interpreted by myself:   EKG Interpretation  Date/Time:  Thursday July 31 2020 21:24:41 EST Ventricular Rate:  99 PR Interval:    QRS Duration: 105 QT Interval:  336 QTC Calculation: 432 R Axis:   72 Text Interpretation: Sinus tachycardia Atrial premature complex Repol abnrm suggests ischemia, anterolateral Artifact Confirmed by Alizza Sacra, Jonny Ruiz ([id number]) on 07/31/2020 10:35:06 PM      Laboratory Studies: Results for orders placed or performed during the hospital encounter of 07/31/20 (from the past 24 hour(s))  Basic metabolic panel     Status: Abnormal   Collection Time: 07/31/20 10:13 PM  Result Value Ref Range   Sodium 140 135 - 145 mmol/L   Potassium 3.3 (L) 3.5 - 5.1 mmol/L   Chloride 96 (L) 98 - 111 mmol/L   CO2 30 22 - 32 mmol/L   Glucose, Bld 166 (H) 70 - 99 mg/dL   BUN 29 (H) 8 - 23 mg/dL   Creatinine, Ser 3.08 (H) 0.44 - 1.00 mg/dL   Calcium 8.3 (L) 8.9 - 10.3 mg/dL   GFR, Estimated 36 (L) >60 mL/min   Anion gap 14 5 - 15   Imaging Studies: DG Sacrum/Coccyx  Result Date: 07/31/2020 CLINICAL DATA:  Coccyx pain after multiple falls. EXAM: SACRUM AND COCCYX - 2+ VIEW COMPARISON:  None. FINDINGS: Cortical margins of the sacrum and coccyx are intact. There is no evidence of fracture or other focal bone lesions. Sacroiliac joints are congruent with bilateral degenerative change. The sacral ala are maintained. No fracture of the included visualized bony pelvis. IMPRESSION: No sacral or coccygeal fracture. Electronically Signed   By: Narda Rutherford M.D.   On: 07/31/2020 23:45    ED COURSE and MDM  Nursing notes, initial and subsequent vitals signs, including pulse oximetry, reviewed and interpreted by myself.  Vitals:   07/31/20 2101 07/31/20 2238 07/31/20 2300 08/01/20 0000  BP: 111/64 (!) 165/80 (!) 180/69 (!) 176/77  Pulse: 100 94 85 76  Resp: 18 (!) 23 (!) 25 18  Temp: 98.8 F (37.1 C)     TempSrc:  Oral     SpO2: 99% 100% 100% 97%   Medications  potassium chloride SA (KLOR-CON) CR tablet 40 mEq (40 mEq Oral Given 08/01/20 0105)    12:56 AM The patient refuses any additional blood sticks and we do not have a CBC on her.  Her potassium is only slightly low at 3.3 which does not warrant hospitalization.  We will give her a dose of K-Dur and discharge her home as she states she would sign out AMA anyway.  PROCEDURES  Procedures   ED DIAGNOSES     ICD-10-CM   1. Hypokalemia  E87.6   2. Coccydynia  M53.3        October Peery, MD 08/01/20 0057    Paula Libra, MD 08/01/20 [phone]

## 2020-08-01 MED ORDER — POTASSIUM CHLORIDE CRYS ER 20 MEQ PO TBCR
40.0000 meq | EXTENDED_RELEASE_TABLET | Freq: Once | ORAL | Status: AC
  Administered 2020-08-01: 40 meq via ORAL
  Filled 2020-08-01: qty 2

## 2020-08-01 NOTE — ED Notes (Signed)
Patient stated she did not want her discharge papers and left.

## 2020-08-07 DIAGNOSIS — E876 Hypokalemia: Secondary | ICD-10-CM | POA: Diagnosis not present

## 2020-08-07 DIAGNOSIS — N1831 Chronic kidney disease, stage 3a: Secondary | ICD-10-CM | POA: Diagnosis not present

## 2020-08-07 DIAGNOSIS — S91109D Unspecified open wound of unspecified toe(s) without damage to nail, subsequent encounter: Secondary | ICD-10-CM | POA: Diagnosis not present

## 2020-08-21 DIAGNOSIS — M5136 Other intervertebral disc degeneration, lumbar region: Secondary | ICD-10-CM | POA: Diagnosis not present

## 2020-08-21 DIAGNOSIS — M5030 Other cervical disc degeneration, unspecified cervical region: Secondary | ICD-10-CM | POA: Diagnosis not present

## 2020-08-21 DIAGNOSIS — G8929 Other chronic pain: Secondary | ICD-10-CM | POA: Diagnosis not present

## 2020-08-21 DIAGNOSIS — D649 Anemia, unspecified: Secondary | ICD-10-CM | POA: Diagnosis not present

## 2020-09-10 DIAGNOSIS — M5431 Sciatica, right side: Secondary | ICD-10-CM | POA: Diagnosis not present

## 2020-09-10 DIAGNOSIS — I69354 Hemiplegia and hemiparesis following cerebral infarction affecting left non-dominant side: Secondary | ICD-10-CM | POA: Diagnosis not present

## 2020-09-10 DIAGNOSIS — M21271 Flexion deformity, right ankle and toes: Secondary | ICD-10-CM | POA: Diagnosis not present

## 2020-09-15 DIAGNOSIS — Z0001 Encounter for general adult medical examination with abnormal findings: Secondary | ICD-10-CM | POA: Diagnosis not present

## 2020-09-15 DIAGNOSIS — E7800 Pure hypercholesterolemia, unspecified: Secondary | ICD-10-CM | POA: Diagnosis not present

## 2020-09-15 DIAGNOSIS — I10 Essential (primary) hypertension: Secondary | ICD-10-CM | POA: Diagnosis not present

## 2020-09-15 DIAGNOSIS — E1142 Type 2 diabetes mellitus with diabetic polyneuropathy: Secondary | ICD-10-CM | POA: Diagnosis not present

## 2020-09-16 ENCOUNTER — Other Ambulatory Visit: Payer: Self-pay | Admitting: Family Medicine

## 2020-09-16 DIAGNOSIS — E2839 Other primary ovarian failure: Secondary | ICD-10-CM

## 2020-09-25 DIAGNOSIS — M5459 Other low back pain: Secondary | ICD-10-CM | POA: Diagnosis not present

## 2020-11-05 DIAGNOSIS — E1142 Type 2 diabetes mellitus with diabetic polyneuropathy: Secondary | ICD-10-CM | POA: Diagnosis not present

## 2020-11-05 DIAGNOSIS — E11319 Type 2 diabetes mellitus with unspecified diabetic retinopathy without macular edema: Secondary | ICD-10-CM | POA: Diagnosis not present

## 2020-11-05 DIAGNOSIS — F331 Major depressive disorder, recurrent, moderate: Secondary | ICD-10-CM | POA: Diagnosis not present

## 2020-11-05 DIAGNOSIS — R5381 Other malaise: Secondary | ICD-10-CM | POA: Diagnosis not present

## 2020-11-10 DIAGNOSIS — F409 Phobic anxiety disorder, unspecified: Secondary | ICD-10-CM | POA: Diagnosis not present

## 2020-11-10 DIAGNOSIS — G8929 Other chronic pain: Secondary | ICD-10-CM | POA: Diagnosis not present

## 2020-11-10 DIAGNOSIS — R32 Unspecified urinary incontinence: Secondary | ICD-10-CM | POA: Diagnosis not present

## 2020-11-10 DIAGNOSIS — M5030 Other cervical disc degeneration, unspecified cervical region: Secondary | ICD-10-CM | POA: Diagnosis not present

## 2020-11-10 DIAGNOSIS — I69351 Hemiplegia and hemiparesis following cerebral infarction affecting right dominant side: Secondary | ICD-10-CM | POA: Diagnosis not present

## 2020-11-10 DIAGNOSIS — K5901 Slow transit constipation: Secondary | ICD-10-CM | POA: Diagnosis not present

## 2020-11-10 DIAGNOSIS — E1142 Type 2 diabetes mellitus with diabetic polyneuropathy: Secondary | ICD-10-CM | POA: Diagnosis not present

## 2020-11-10 DIAGNOSIS — E7800 Pure hypercholesterolemia, unspecified: Secondary | ICD-10-CM | POA: Diagnosis not present

## 2020-11-10 DIAGNOSIS — M5136 Other intervertebral disc degeneration, lumbar region: Secondary | ICD-10-CM | POA: Diagnosis not present

## 2020-11-10 DIAGNOSIS — I69354 Hemiplegia and hemiparesis following cerebral infarction affecting left non-dominant side: Secondary | ICD-10-CM | POA: Diagnosis not present

## 2020-11-10 DIAGNOSIS — D631 Anemia in chronic kidney disease: Secondary | ICD-10-CM | POA: Diagnosis not present

## 2020-11-10 DIAGNOSIS — F331 Major depressive disorder, recurrent, moderate: Secondary | ICD-10-CM | POA: Diagnosis not present

## 2020-11-10 DIAGNOSIS — E113493 Type 2 diabetes mellitus with severe nonproliferative diabetic retinopathy without macular edema, bilateral: Secondary | ICD-10-CM | POA: Diagnosis not present

## 2020-11-10 DIAGNOSIS — G4700 Insomnia, unspecified: Secondary | ICD-10-CM | POA: Diagnosis not present

## 2020-11-10 DIAGNOSIS — I129 Hypertensive chronic kidney disease with stage 1 through stage 4 chronic kidney disease, or unspecified chronic kidney disease: Secondary | ICD-10-CM | POA: Diagnosis not present

## 2020-11-10 DIAGNOSIS — N184 Chronic kidney disease, stage 4 (severe): Secondary | ICD-10-CM | POA: Diagnosis not present

## 2020-11-10 DIAGNOSIS — E1122 Type 2 diabetes mellitus with diabetic chronic kidney disease: Secondary | ICD-10-CM | POA: Diagnosis not present

## 2020-12-10 DIAGNOSIS — K5901 Slow transit constipation: Secondary | ICD-10-CM | POA: Diagnosis not present

## 2020-12-10 DIAGNOSIS — M5030 Other cervical disc degeneration, unspecified cervical region: Secondary | ICD-10-CM | POA: Diagnosis not present

## 2020-12-10 DIAGNOSIS — E1122 Type 2 diabetes mellitus with diabetic chronic kidney disease: Secondary | ICD-10-CM | POA: Diagnosis not present

## 2020-12-10 DIAGNOSIS — G4700 Insomnia, unspecified: Secondary | ICD-10-CM | POA: Diagnosis not present

## 2020-12-10 DIAGNOSIS — E1142 Type 2 diabetes mellitus with diabetic polyneuropathy: Secondary | ICD-10-CM | POA: Diagnosis not present

## 2020-12-10 DIAGNOSIS — I69354 Hemiplegia and hemiparesis following cerebral infarction affecting left non-dominant side: Secondary | ICD-10-CM | POA: Diagnosis not present

## 2020-12-10 DIAGNOSIS — M5136 Other intervertebral disc degeneration, lumbar region: Secondary | ICD-10-CM | POA: Diagnosis not present

## 2020-12-10 DIAGNOSIS — F409 Phobic anxiety disorder, unspecified: Secondary | ICD-10-CM | POA: Diagnosis not present

## 2020-12-10 DIAGNOSIS — E7800 Pure hypercholesterolemia, unspecified: Secondary | ICD-10-CM | POA: Diagnosis not present

## 2020-12-10 DIAGNOSIS — R32 Unspecified urinary incontinence: Secondary | ICD-10-CM | POA: Diagnosis not present

## 2020-12-10 DIAGNOSIS — I129 Hypertensive chronic kidney disease with stage 1 through stage 4 chronic kidney disease, or unspecified chronic kidney disease: Secondary | ICD-10-CM | POA: Diagnosis not present

## 2020-12-10 DIAGNOSIS — D631 Anemia in chronic kidney disease: Secondary | ICD-10-CM | POA: Diagnosis not present

## 2020-12-10 DIAGNOSIS — N184 Chronic kidney disease, stage 4 (severe): Secondary | ICD-10-CM | POA: Diagnosis not present

## 2020-12-10 DIAGNOSIS — F331 Major depressive disorder, recurrent, moderate: Secondary | ICD-10-CM | POA: Diagnosis not present

## 2020-12-10 DIAGNOSIS — G8929 Other chronic pain: Secondary | ICD-10-CM | POA: Diagnosis not present

## 2020-12-10 DIAGNOSIS — E113493 Type 2 diabetes mellitus with severe nonproliferative diabetic retinopathy without macular edema, bilateral: Secondary | ICD-10-CM | POA: Diagnosis not present

## 2021-01-01 DIAGNOSIS — G4700 Insomnia, unspecified: Secondary | ICD-10-CM | POA: Diagnosis not present

## 2021-01-01 DIAGNOSIS — I69354 Hemiplegia and hemiparesis following cerebral infarction affecting left non-dominant side: Secondary | ICD-10-CM | POA: Diagnosis not present

## 2021-01-01 DIAGNOSIS — I10 Essential (primary) hypertension: Secondary | ICD-10-CM | POA: Diagnosis not present

## 2021-01-01 DIAGNOSIS — M5136 Other intervertebral disc degeneration, lumbar region: Secondary | ICD-10-CM | POA: Diagnosis not present

## 2021-03-19 IMAGING — CR DG SACRUM/COCCYX 2+V
3 series · 3 of 3 positions shown · non-contrast
Comparison: None.

CLINICAL DATA: Coccyx pain after multiple falls.

EXAM:
SACRUM AND COCCYX - 2+ VIEW

[t sacrum ap]
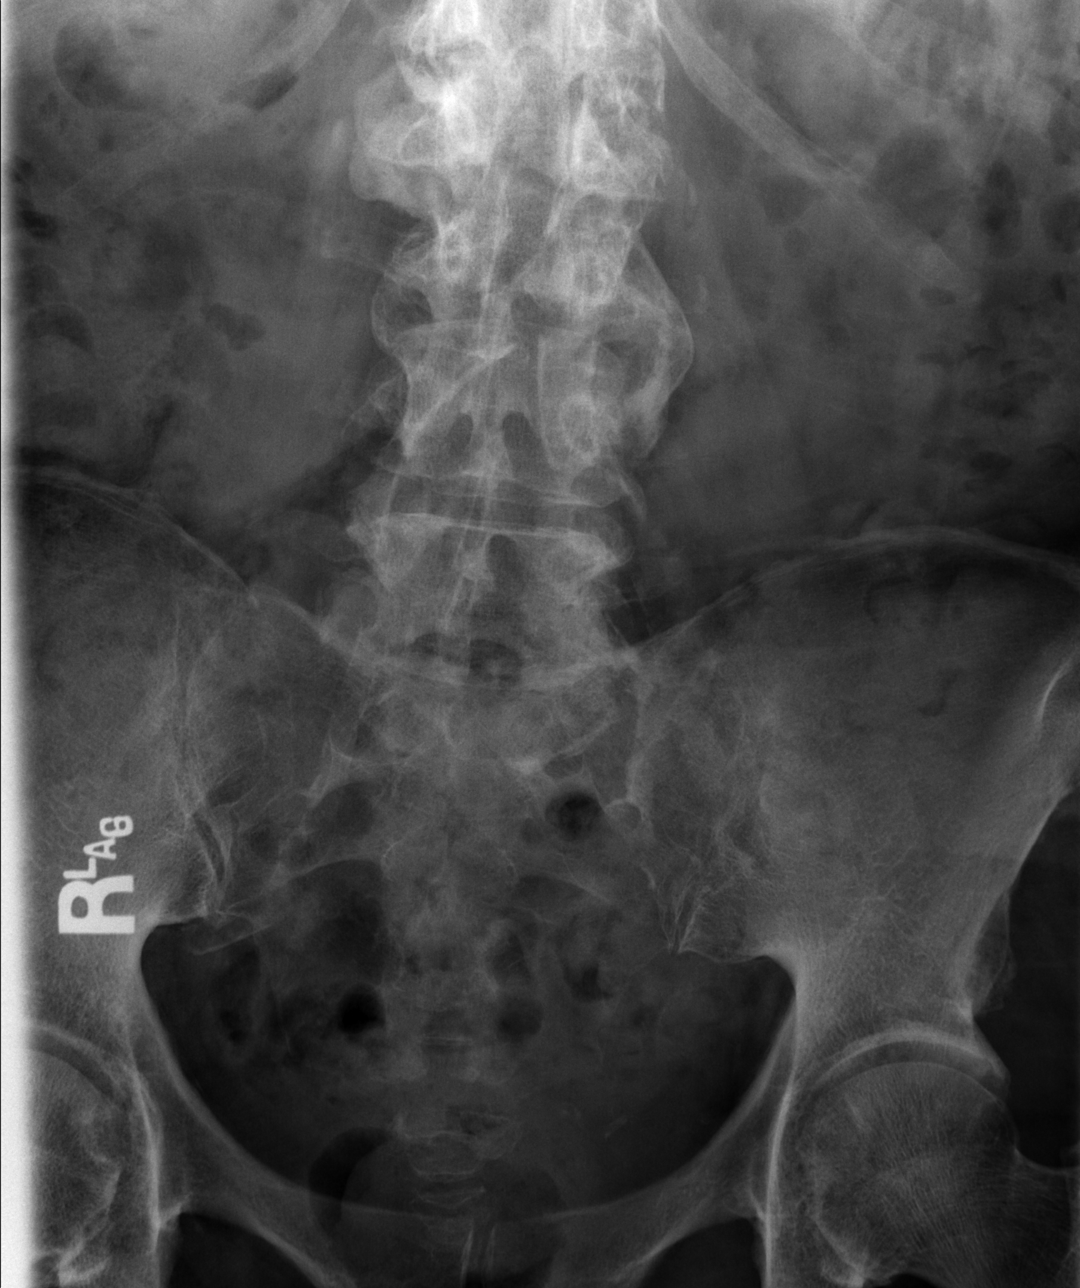

[t coccyx ap]
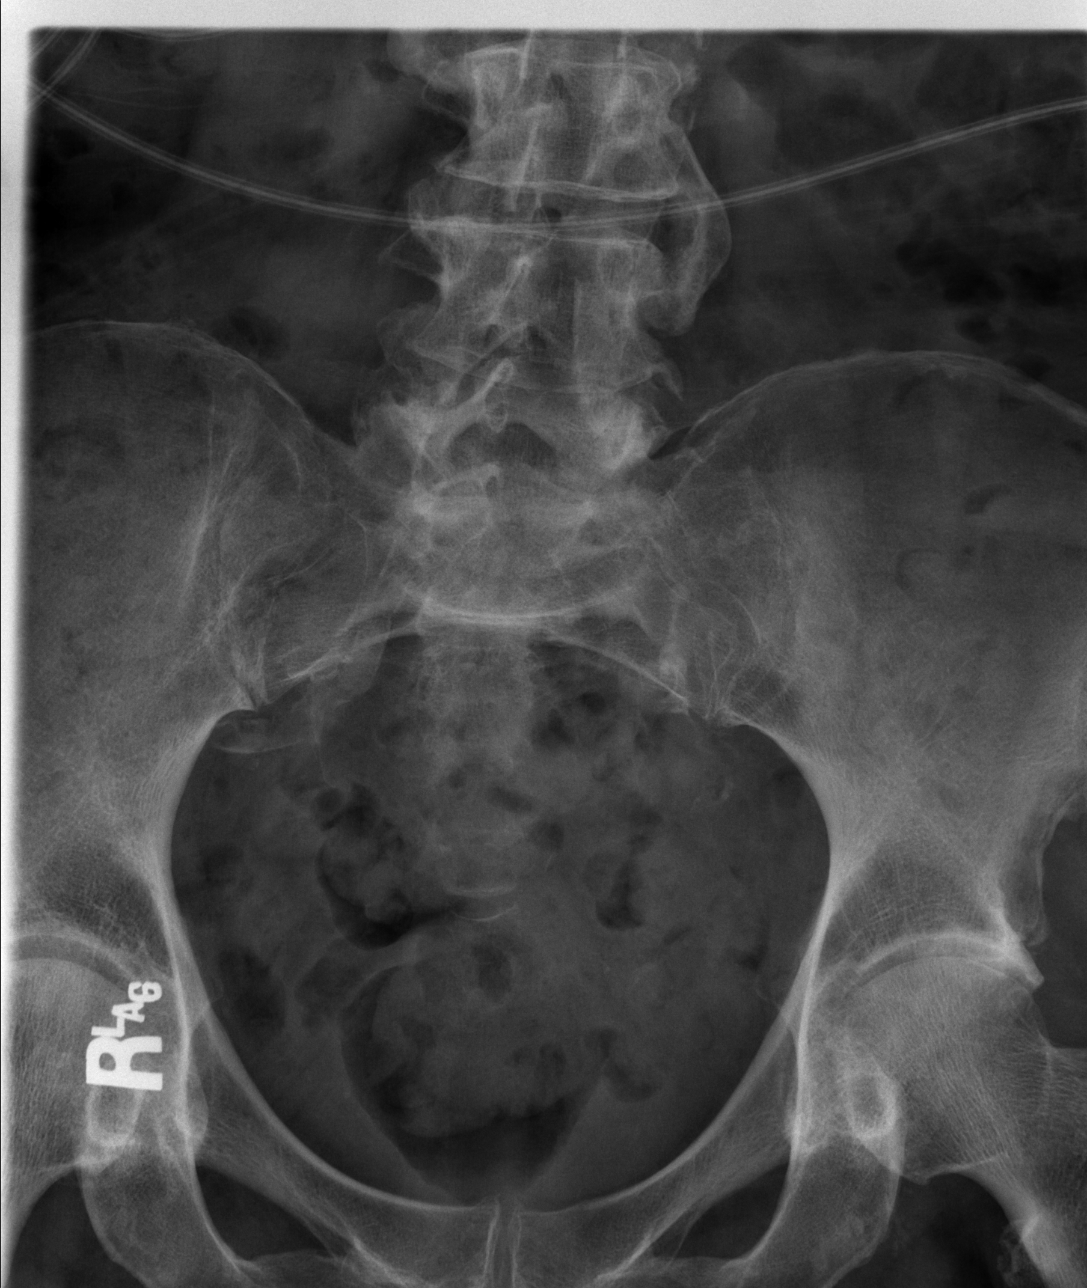

[t sacrum coccyx lat]
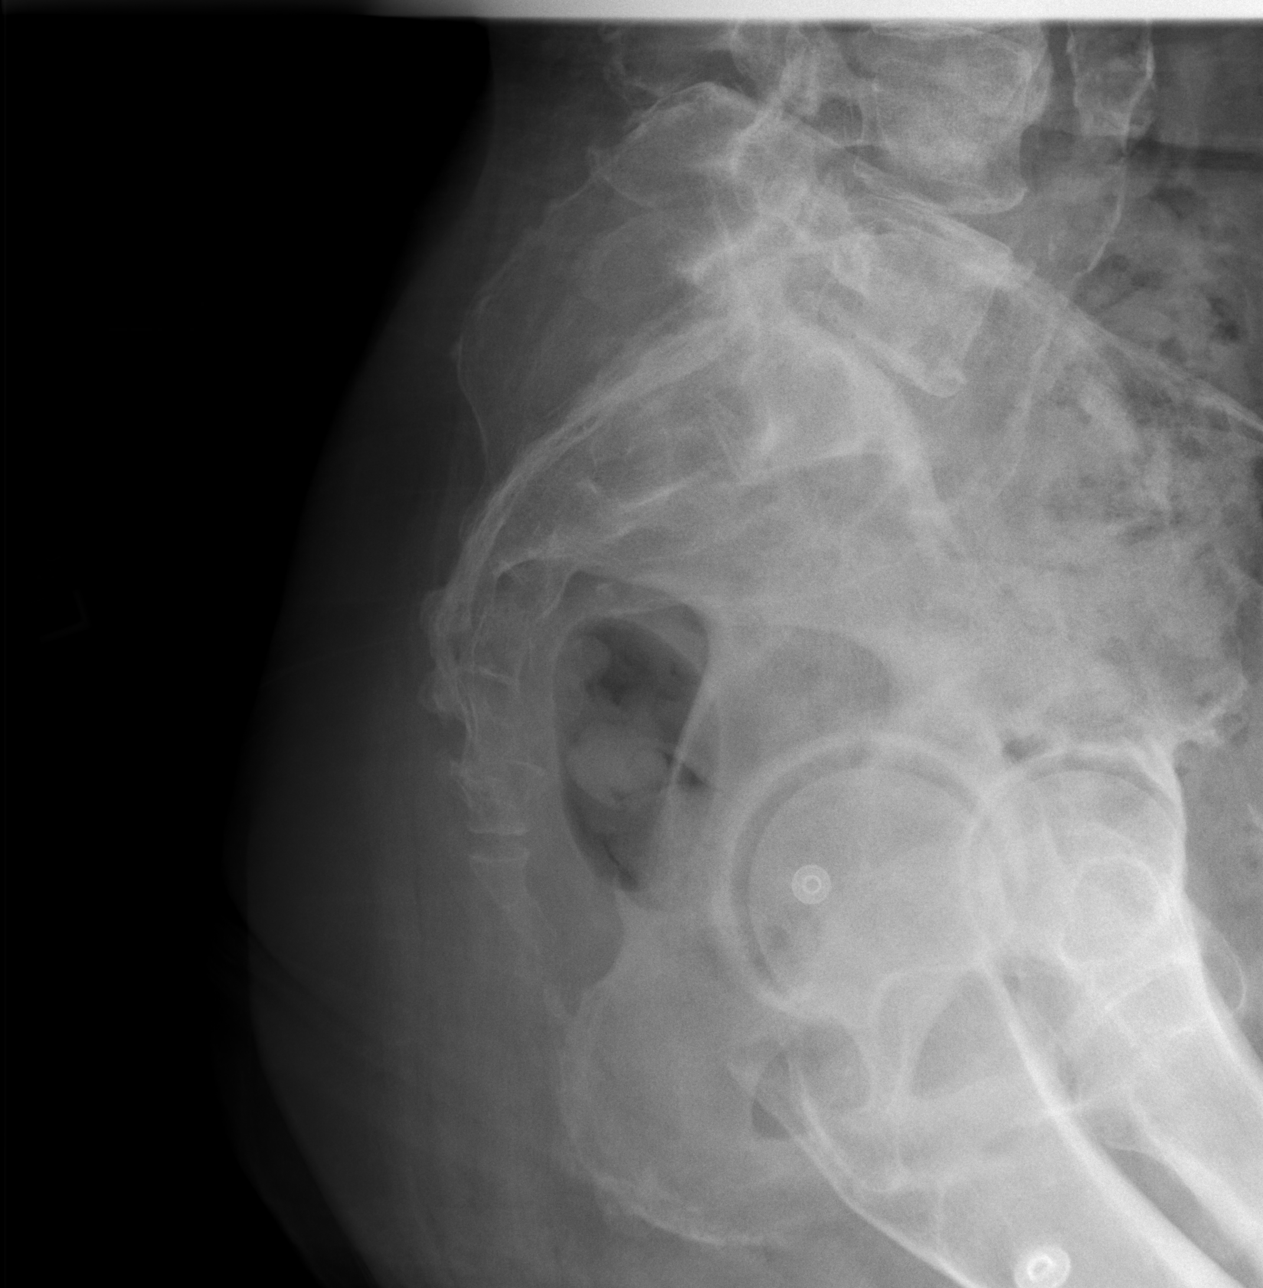

[3 of 3 positions shown; findings below may reference images not displayed]

FINDINGS: Cortical margins of the sacrum and coccyx are intact. There is no
evidence of fracture or other focal bone lesions. Sacroiliac joints
are congruent with bilateral degenerative change. The sacral ala are
maintained. No fracture of the included visualized bony pelvis.
IMPRESSION: No sacral or coccygeal fracture.

## 2021-03-30 ENCOUNTER — Other Ambulatory Visit: Payer: Medicare Other

## 2021-04-14 DIAGNOSIS — E7800 Pure hypercholesterolemia, unspecified: Secondary | ICD-10-CM | POA: Diagnosis not present

## 2021-04-14 DIAGNOSIS — E785 Hyperlipidemia, unspecified: Secondary | ICD-10-CM | POA: Diagnosis not present

## 2021-04-14 DIAGNOSIS — I10 Essential (primary) hypertension: Secondary | ICD-10-CM | POA: Diagnosis not present

## 2021-04-14 DIAGNOSIS — E1165 Type 2 diabetes mellitus with hyperglycemia: Secondary | ICD-10-CM | POA: Diagnosis not present

## 2021-07-27 DIAGNOSIS — E1165 Type 2 diabetes mellitus with hyperglycemia: Secondary | ICD-10-CM | POA: Diagnosis not present

## 2021-07-27 DIAGNOSIS — E7800 Pure hypercholesterolemia, unspecified: Secondary | ICD-10-CM | POA: Diagnosis not present

## 2021-07-27 DIAGNOSIS — I10 Essential (primary) hypertension: Secondary | ICD-10-CM | POA: Diagnosis not present

## 2021-07-27 DIAGNOSIS — G8929 Other chronic pain: Secondary | ICD-10-CM | POA: Diagnosis not present

## 2021-08-21 DIAGNOSIS — I10 Essential (primary) hypertension: Secondary | ICD-10-CM | POA: Diagnosis not present

## 2021-08-21 DIAGNOSIS — E1165 Type 2 diabetes mellitus with hyperglycemia: Secondary | ICD-10-CM | POA: Diagnosis not present

## 2021-08-21 DIAGNOSIS — E7800 Pure hypercholesterolemia, unspecified: Secondary | ICD-10-CM | POA: Diagnosis not present

## 2021-08-21 DIAGNOSIS — G8929 Other chronic pain: Secondary | ICD-10-CM | POA: Diagnosis not present

## 2021-09-04 ENCOUNTER — Inpatient Hospital Stay: Admission: RE | Admit: 2021-09-04 | Payer: Medicare Other | Source: Ambulatory Visit

## 2021-09-16 DIAGNOSIS — M5030 Other cervical disc degeneration, unspecified cervical region: Secondary | ICD-10-CM | POA: Diagnosis not present

## 2021-09-16 DIAGNOSIS — M5136 Other intervertebral disc degeneration, lumbar region: Secondary | ICD-10-CM | POA: Diagnosis not present

## 2021-09-16 DIAGNOSIS — E1165 Type 2 diabetes mellitus with hyperglycemia: Secondary | ICD-10-CM | POA: Diagnosis not present

## 2021-09-16 DIAGNOSIS — E11319 Type 2 diabetes mellitus with unspecified diabetic retinopathy without macular edema: Secondary | ICD-10-CM | POA: Diagnosis not present

## 2021-09-16 DIAGNOSIS — Z79899 Other long term (current) drug therapy: Secondary | ICD-10-CM | POA: Diagnosis not present

## 2021-09-16 DIAGNOSIS — E7800 Pure hypercholesterolemia, unspecified: Secondary | ICD-10-CM | POA: Diagnosis not present

## 2021-09-16 DIAGNOSIS — Z0000 Encounter for general adult medical examination without abnormal findings: Secondary | ICD-10-CM | POA: Diagnosis not present

## 2021-09-16 DIAGNOSIS — E1142 Type 2 diabetes mellitus with diabetic polyneuropathy: Secondary | ICD-10-CM | POA: Diagnosis not present

## 2021-09-16 DIAGNOSIS — R296 Repeated falls: Secondary | ICD-10-CM | POA: Diagnosis not present

## 2021-09-16 DIAGNOSIS — N1831 Chronic kidney disease, stage 3a: Secondary | ICD-10-CM | POA: Diagnosis not present

## 2021-09-16 DIAGNOSIS — R6889 Other general symptoms and signs: Secondary | ICD-10-CM | POA: Diagnosis not present

## 2021-09-17 ENCOUNTER — Other Ambulatory Visit: Payer: Self-pay | Admitting: Family Medicine

## 2021-09-17 DIAGNOSIS — E2839 Other primary ovarian failure: Secondary | ICD-10-CM

## 2021-10-07 ENCOUNTER — Ambulatory Visit: Payer: Medicare HMO | Admitting: Podiatry

## 2021-10-07 DIAGNOSIS — M79675 Pain in left toe(s): Secondary | ICD-10-CM | POA: Diagnosis not present

## 2021-10-07 DIAGNOSIS — M79674 Pain in right toe(s): Secondary | ICD-10-CM

## 2021-10-07 DIAGNOSIS — E1142 Type 2 diabetes mellitus with diabetic polyneuropathy: Secondary | ICD-10-CM

## 2021-10-07 DIAGNOSIS — B351 Tinea unguium: Secondary | ICD-10-CM | POA: Diagnosis not present

## 2021-10-07 DIAGNOSIS — E1165 Type 2 diabetes mellitus with hyperglycemia: Secondary | ICD-10-CM

## 2021-10-07 DIAGNOSIS — E119 Type 2 diabetes mellitus without complications: Secondary | ICD-10-CM

## 2021-10-07 DIAGNOSIS — R6889 Other general symptoms and signs: Secondary | ICD-10-CM | POA: Diagnosis not present

## 2021-10-13 NOTE — Progress Notes (Signed)
Subjective: ?Cynthia Hunt presents today referred by Lujean Amel, MD for diabetic foot evaluation. ? ?Patient relates 8 year history of diabetes. ? ?Patient denies any history of foot wounds.  Last A1c of 6.4 ? ?Patient denies any history of numbness, tingling, burning, pins/needles sensations. ? ?Past Medical History:  ?Diagnosis Date  ? Depression   ? Diabetes mellitus without complication ([place])   ? Hyperlipidemia   ? Hypertension   ? ? ?Patient Active Problem List  ? Diagnosis Date Noted  ? Diabetes mellitus ([place]) 01/23/2019  ? Type 2 diabetes mellitus with diabetic polyneuropathy, without long-term current use of insulin ([place]) 01/23/2019  ? Type 2 diabetes mellitus with stage 3 chronic kidney disease, without long-term current use of insulin ([place]) 01/23/2019  ? Type 2 diabetes mellitus with hyperglycemia, without long-term current use of insulin ([place]) 01/23/2019  ? Benign essential HTN   ? Slow transit constipation   ? Labile blood glucose   ? CKD (chronic kidney disease), stage III ([place])   ? Acute blood loss anemia   ? Hypertensive crisis   ? 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 Hypokalemia   ? Class 1 obesity due to excess calories with serious comorbidity and body mass index (BMI) of 32.0 to 32.9 in adult   ? Poorly controlled type 2 diabetes mellitus with peripheral neuropathy ([place])   ? Acute ischemic stroke ([place]) 10/22/2018  ? Essential hypertension 10/22/2018  ? Dyslipidemia 10/22/2018  ? Uncontrolled type 2 diabetes mellitus without complication, with long-term current use of insulin 02/13/2016  ? ? ?Past Surgical History:  ?Procedure Laterality Date  ? BUBBLE STUDY  10/24/2018  ? Procedure: BUBBLE STUDY;  Surgeon: Jerline Pain, MD;  Location: [facility] ENDOSCOPY;  Service: Cardiovascular;;  ? TEE WITHOUT CARDIOVERSION N/A 10/24/2018  ? Procedure: TRANSESOPHAGEAL ECHOCARDIOGRAM (TEE);  Surgeon: Jerline Pain, MD;  Location: [facility] ENDOSCOPY;  Service: Cardiovascular;  Laterality: N/A;  ?  UTERINE FIBROID SURGERY    ? ? ?Current Outpatient Medications on File Prior to Visit  ?Medication Sig Dispense Refill  ? aspirin EC 81 MG EC tablet Take 1 tablet (81 mg total) by mouth daily.    ? atorvastatin (LIPITOR) 40 MG tablet Take 1 tablet (40 mg total) by mouth daily at 6 PM. 30 tablet 0  ? gabapentin (NEURONTIN) 300 MG capsule Take 600 mg by mouth 2 (two) times daily.    ? glucose blood test strip Use as instructed to test blood sugar 2 times daily DX E11.65 100 each 12  ? Lancets (FREESTYLE) lancets As directed 100 each 12  ? losartan (COZAAR) 100 MG tablet Take 1 tablet (100 mg total) by mouth daily. (Patient taking differently: Take 50 mg by mouth daily.) 30 tablet 1  ? magnesium 30 MG tablet Take 30 mg by mouth daily.    ? metFORMIN (GLUCOPHAGE) 1000 MG tablet Take 1 tablet (1,000 mg total) by mouth daily with breakfast. 90 tablet 3  ? Multiple Vitamin (MULTIVITAMIN WITH MINERALS) TABS tablet Take 1 tablet by mouth daily.    ? traZODone (DESYREL) 100 MG tablet Take 1 tablet (100 mg total) by mouth at bedtime. 20 tablet 0  ? ?No current facility-administered medications on file prior to visit.  ?  ? ?Allergies  ?Allergen Reactions  ? Other Other (See Comments)  ? Penicillins Other (See Comments)  ?  Childhood Allergy ?Did it involve swelling of the face/tongue/throat, SOB, or low BP? Unknown ?Did it involve sudden or severe rash/hives, skin  peeling, or any reaction on the inside of your mouth or nose? Unknown ?Did you need to seek medical attention at a hospital or doctor's office? Unknown ?When did it last happen?     childhood ?If all above answers are "NO", may proceed with cephalosporin use. ?  ? ? ?Social History  ? ?Occupational History  ? Occupation: retired  ?Tobacco Use  ? Smoking status: Former  ?  Types: Cigarettes  ?  Quit date: 03/18/2006  ?  Years since quitting: 15.5  ? Smokeless tobacco: Never  ?Substance and Sexual Activity  ? Alcohol use: Yes  ?  Comment: occasionally when out to eat  ?  Drug use: Not Currently  ?  Types: Marijuana  ?  Comment: pt reports using years ago  ? Sexual activity: Not on file  ? ? ?Family History  ?Problem Relation Age of Onset  ? Stroke Mother   ? ? ? ?There is no immunization history on file for this patient. ? ?Review of systems: Positive Findings in bold print. ? ?Constitutional:  chills, fatigue, fever, sweats, weight change ?Communication: Optometrist, sign Ecologist, hand writing, iPad/Android device ?Head: headaches, head injury ?Eyes: changes in vision, eye pain, glaucoma, cataracts, macular degeneration, diplopia, glare,  light sensitivity, eyeglasses or contacts, blindness ?Ears nose mouth throat: hearing impaired, hearing aids,  ringing in ears, deaf, sign language,  vertigo, nosebleeds,  rhinitis,  cold sores, snoring, swollen glands ?Cardiovascular: HTN, edema, arrhythmia, pacemaker in place, defibrillator in place, chest pain/tightness, chronic anticoagulation, blood clot, heart failure, MI ?Peripheral Vascular: leg cramps, varicose veins, blood clots, lymphedema, varicosities ?Respiratory:  asthma, difficulty breathing, denies congestion, SOB, wheezing, cough, emphysema ?Gastrointestinal: change in appetite or weight, abdominal pain, constipation, diarrhea, nausea, vomiting, vomiting blood, change in bowel habits, abdominal pain, jaundice, rectal bleeding, hemorrhoids, GERD ?Genitourinary:  nocturia,  pain on urination, polyuria,  blood in urine, Foley catheter, urinary urgency, ESRD on hemodialysis ?Musculoskeletal: amputation, cramping, stiff joints, painful joints, decreased joint motion, fractures, OA, gout, hemiplegia, paraplegia, uses cane, wheelchair bound, uses walker, uses rollator ?Skin: +changes in toenails, color change, dryness, itching, mole changes,  rash, wound(s) ?Neurological: headaches, numbness in feet, paresthesias in feet, burning in feet, fainting,  seizures, change in speech, migraines, memory problems/poor historian,  cerebral palsy, weakness, paralysis, CVA, TIA ?Endocrine: diabetes, hypothyroidism, hyperthyroidism,  goiter, dry mouth, flushing, heat intolerance, cold intolerance,  excessive thirst, denies polyuria,  nocturia ?Hematological:  easy bleeding, excessive bleeding, easy bruising, enlarged lymph nodes, on long term blood thinner, history of past transusions ?Allergy/immunological:  hives, eczema, frequent infections, multiple drug allergies, seasonal allergies, transplant recipient, multiple food allergies ?Psychiatric:  anxiety, depression, mood disorder, suicidal ideations, hallucinations, insomnia ? ?Objective: ?There were no vitals filed for this visit. ?Vascular Examination: ?Capillary refill time less than 3 seconds x 10 digits. ? ?Dorsalis pedis pulses palpable 2 out of 4. ? ?Posterior tibial pulses palpable 2 out of 4. ? ?Digital hair not present x 10 digits. ? ?Skin temperature gradient WNL b/l. ? ?Dermatological Examination: ?Skin with normal turgor, texture and tone b/l ? ?Toenails 1-5 b/l discolored, thick, dystrophic with subungual debris and pain with palpation to nailbeds due to thickness of nails. ? ?Musculoskeletal: ?Muscle strength 5/5 to all LE muscle groups. ? ?Neurological: ?Sensation intact with 10 gram monofilament. ? ?Vibratory sensation diminished ? ?Assessment: ?NIDDM ?Encounter for diabetic foot examination ? ?Plan: ?Discussed diabetic foot care principles. Literature dispensed on today. ?Patient to continue soft, supportive shoe gear daily. ?Patient to report any pedal  injuries to medical professional immediately. ?Follow up one year. ?Patient/POA to call should there be a concern in the interim. ? ?

## 2021-11-16 DIAGNOSIS — E1165 Type 2 diabetes mellitus with hyperglycemia: Secondary | ICD-10-CM | POA: Diagnosis not present

## 2021-11-16 DIAGNOSIS — I10 Essential (primary) hypertension: Secondary | ICD-10-CM | POA: Diagnosis not present

## 2021-11-16 DIAGNOSIS — E1142 Type 2 diabetes mellitus with diabetic polyneuropathy: Secondary | ICD-10-CM | POA: Diagnosis not present

## 2021-11-16 DIAGNOSIS — G8929 Other chronic pain: Secondary | ICD-10-CM | POA: Diagnosis not present

## 2021-12-24 ENCOUNTER — Ambulatory Visit: Payer: Medicare HMO | Admitting: Neurology

## 2021-12-24 DIAGNOSIS — I959 Hypotension, unspecified: Secondary | ICD-10-CM | POA: Diagnosis not present

## 2021-12-24 DIAGNOSIS — W19XXXA Unspecified fall, initial encounter: Secondary | ICD-10-CM | POA: Diagnosis not present

## 2021-12-24 DIAGNOSIS — R4020 Unspecified coma: Secondary | ICD-10-CM | POA: Diagnosis not present

## 2021-12-30 ENCOUNTER — Ambulatory Visit: Payer: Medicare HMO | Admitting: Neurology

## 2022-01-08 DIAGNOSIS — Z79899 Other long term (current) drug therapy: Secondary | ICD-10-CM | POA: Diagnosis not present

## 2022-01-08 DIAGNOSIS — R296 Repeated falls: Secondary | ICD-10-CM | POA: Diagnosis not present

## 2022-01-08 DIAGNOSIS — E11319 Type 2 diabetes mellitus with unspecified diabetic retinopathy without macular edema: Secondary | ICD-10-CM | POA: Diagnosis not present

## 2022-01-08 DIAGNOSIS — R6889 Other general symptoms and signs: Secondary | ICD-10-CM | POA: Diagnosis not present

## 2022-01-08 DIAGNOSIS — F331 Major depressive disorder, recurrent, moderate: Secondary | ICD-10-CM | POA: Diagnosis not present

## 2022-01-08 DIAGNOSIS — R531 Weakness: Secondary | ICD-10-CM | POA: Diagnosis not present

## 2022-01-08 DIAGNOSIS — I10 Essential (primary) hypertension: Secondary | ICD-10-CM | POA: Diagnosis not present

## 2022-01-08 DIAGNOSIS — G629 Polyneuropathy, unspecified: Secondary | ICD-10-CM | POA: Diagnosis not present

## 2022-01-25 DIAGNOSIS — E785 Hyperlipidemia, unspecified: Secondary | ICD-10-CM | POA: Diagnosis not present

## 2022-01-25 DIAGNOSIS — M21271 Flexion deformity, right ankle and toes: Secondary | ICD-10-CM | POA: Diagnosis not present

## 2022-01-25 DIAGNOSIS — I10 Essential (primary) hypertension: Secondary | ICD-10-CM | POA: Diagnosis not present

## 2022-01-25 DIAGNOSIS — Z7984 Long term (current) use of oral hypoglycemic drugs: Secondary | ICD-10-CM | POA: Diagnosis not present

## 2022-01-25 DIAGNOSIS — I69354 Hemiplegia and hemiparesis following cerebral infarction affecting left non-dominant side: Secondary | ICD-10-CM | POA: Diagnosis not present

## 2022-01-25 DIAGNOSIS — F331 Major depressive disorder, recurrent, moderate: Secondary | ICD-10-CM | POA: Diagnosis not present

## 2022-01-25 DIAGNOSIS — M5431 Sciatica, right side: Secondary | ICD-10-CM | POA: Diagnosis not present

## 2022-01-25 DIAGNOSIS — E11319 Type 2 diabetes mellitus with unspecified diabetic retinopathy without macular edema: Secondary | ICD-10-CM | POA: Diagnosis not present

## 2022-01-25 DIAGNOSIS — E1142 Type 2 diabetes mellitus with diabetic polyneuropathy: Secondary | ICD-10-CM | POA: Diagnosis not present

## 2022-02-02 DIAGNOSIS — Z794 Long term (current) use of insulin: Secondary | ICD-10-CM | POA: Diagnosis not present

## 2022-02-02 DIAGNOSIS — E118 Type 2 diabetes mellitus with unspecified complications: Secondary | ICD-10-CM | POA: Diagnosis not present

## 2022-02-11 DIAGNOSIS — E1165 Type 2 diabetes mellitus with hyperglycemia: Secondary | ICD-10-CM | POA: Diagnosis not present

## 2022-02-11 DIAGNOSIS — G8929 Other chronic pain: Secondary | ICD-10-CM | POA: Diagnosis not present

## 2022-02-11 DIAGNOSIS — E7800 Pure hypercholesterolemia, unspecified: Secondary | ICD-10-CM | POA: Diagnosis not present

## 2022-02-11 DIAGNOSIS — G4700 Insomnia, unspecified: Secondary | ICD-10-CM | POA: Diagnosis not present

## 2022-02-11 DIAGNOSIS — I10 Essential (primary) hypertension: Secondary | ICD-10-CM | POA: Diagnosis not present

## 2022-02-15 ENCOUNTER — Ambulatory Visit: Payer: Medicare HMO | Admitting: Neurology

## 2022-03-04 DIAGNOSIS — Z794 Long term (current) use of insulin: Secondary | ICD-10-CM | POA: Diagnosis not present

## 2022-03-04 DIAGNOSIS — E118 Type 2 diabetes mellitus with unspecified complications: Secondary | ICD-10-CM | POA: Diagnosis not present

## 2022-03-23 DIAGNOSIS — E119 Type 2 diabetes mellitus without complications: Secondary | ICD-10-CM | POA: Diagnosis not present

## 2022-03-23 DIAGNOSIS — I10 Essential (primary) hypertension: Secondary | ICD-10-CM | POA: Diagnosis not present

## 2022-03-23 DIAGNOSIS — H35033 Hypertensive retinopathy, bilateral: Secondary | ICD-10-CM | POA: Diagnosis not present

## 2022-03-23 DIAGNOSIS — H2513 Age-related nuclear cataract, bilateral: Secondary | ICD-10-CM | POA: Diagnosis not present

## 2022-04-07 DIAGNOSIS — G629 Polyneuropathy, unspecified: Secondary | ICD-10-CM | POA: Diagnosis not present

## 2022-04-07 DIAGNOSIS — E11319 Type 2 diabetes mellitus with unspecified diabetic retinopathy without macular edema: Secondary | ICD-10-CM | POA: Diagnosis not present

## 2022-04-07 DIAGNOSIS — N1831 Chronic kidney disease, stage 3a: Secondary | ICD-10-CM | POA: Diagnosis not present

## 2022-04-07 DIAGNOSIS — R296 Repeated falls: Secondary | ICD-10-CM | POA: Diagnosis not present

## 2022-04-07 DIAGNOSIS — E113493 Type 2 diabetes mellitus with severe nonproliferative diabetic retinopathy without macular edema, bilateral: Secondary | ICD-10-CM | POA: Diagnosis not present

## 2022-04-07 DIAGNOSIS — Z79899 Other long term (current) drug therapy: Secondary | ICD-10-CM | POA: Diagnosis not present

## 2022-04-07 DIAGNOSIS — R2689 Other abnormalities of gait and mobility: Secondary | ICD-10-CM | POA: Diagnosis not present

## 2022-04-07 DIAGNOSIS — I69354 Hemiplegia and hemiparesis following cerebral infarction affecting left non-dominant side: Secondary | ICD-10-CM | POA: Diagnosis not present

## 2022-04-07 DIAGNOSIS — E7800 Pure hypercholesterolemia, unspecified: Secondary | ICD-10-CM | POA: Diagnosis not present

## 2022-04-08 ENCOUNTER — Ambulatory Visit: Payer: Medicare HMO | Admitting: Neurology

## 2022-04-13 DIAGNOSIS — E1142 Type 2 diabetes mellitus with diabetic polyneuropathy: Secondary | ICD-10-CM | POA: Diagnosis not present

## 2022-04-13 DIAGNOSIS — E1165 Type 2 diabetes mellitus with hyperglycemia: Secondary | ICD-10-CM | POA: Diagnosis not present

## 2022-04-13 DIAGNOSIS — G4700 Insomnia, unspecified: Secondary | ICD-10-CM | POA: Diagnosis not present

## 2022-04-13 DIAGNOSIS — E7800 Pure hypercholesterolemia, unspecified: Secondary | ICD-10-CM | POA: Diagnosis not present

## 2022-04-13 DIAGNOSIS — G8929 Other chronic pain: Secondary | ICD-10-CM | POA: Diagnosis not present

## 2022-04-13 DIAGNOSIS — I10 Essential (primary) hypertension: Secondary | ICD-10-CM | POA: Diagnosis not present

## 2022-05-27 ENCOUNTER — Ambulatory Visit: Payer: Medicare HMO | Admitting: Neurology

## 2022-06-08 DIAGNOSIS — I10 Essential (primary) hypertension: Secondary | ICD-10-CM | POA: Diagnosis not present

## 2022-06-08 DIAGNOSIS — G8929 Other chronic pain: Secondary | ICD-10-CM | POA: Diagnosis not present

## 2022-06-08 DIAGNOSIS — E7800 Pure hypercholesterolemia, unspecified: Secondary | ICD-10-CM | POA: Diagnosis not present

## 2022-06-08 DIAGNOSIS — E1165 Type 2 diabetes mellitus with hyperglycemia: Secondary | ICD-10-CM | POA: Diagnosis not present

## 2022-06-08 DIAGNOSIS — E1142 Type 2 diabetes mellitus with diabetic polyneuropathy: Secondary | ICD-10-CM | POA: Diagnosis not present

## 2022-06-25 DIAGNOSIS — I10 Essential (primary) hypertension: Secondary | ICD-10-CM | POA: Diagnosis not present

## 2022-06-25 DIAGNOSIS — H2511 Age-related nuclear cataract, right eye: Secondary | ICD-10-CM | POA: Diagnosis not present

## 2022-06-25 DIAGNOSIS — H2513 Age-related nuclear cataract, bilateral: Secondary | ICD-10-CM | POA: Diagnosis not present

## 2022-06-25 DIAGNOSIS — R6889 Other general symptoms and signs: Secondary | ICD-10-CM | POA: Diagnosis not present

## 2022-07-29 DIAGNOSIS — K529 Noninfective gastroenteritis and colitis, unspecified: Secondary | ICD-10-CM | POA: Diagnosis not present

## 2022-08-09 DIAGNOSIS — I10 Essential (primary) hypertension: Secondary | ICD-10-CM | POA: Diagnosis not present

## 2022-08-09 DIAGNOSIS — F331 Major depressive disorder, recurrent, moderate: Secondary | ICD-10-CM | POA: Diagnosis not present

## 2022-08-09 DIAGNOSIS — G4700 Insomnia, unspecified: Secondary | ICD-10-CM | POA: Diagnosis not present

## 2022-08-09 DIAGNOSIS — E7800 Pure hypercholesterolemia, unspecified: Secondary | ICD-10-CM | POA: Diagnosis not present

## 2022-08-09 DIAGNOSIS — E1165 Type 2 diabetes mellitus with hyperglycemia: Secondary | ICD-10-CM | POA: Diagnosis not present

## 2022-08-10 DIAGNOSIS — E1121 Type 2 diabetes mellitus with diabetic nephropathy: Secondary | ICD-10-CM | POA: Diagnosis not present

## 2022-08-10 DIAGNOSIS — I69354 Hemiplegia and hemiparesis following cerebral infarction affecting left non-dominant side: Secondary | ICD-10-CM | POA: Diagnosis not present

## 2022-08-10 DIAGNOSIS — E7800 Pure hypercholesterolemia, unspecified: Secondary | ICD-10-CM | POA: Diagnosis not present

## 2022-08-10 DIAGNOSIS — N1831 Chronic kidney disease, stage 3a: Secondary | ICD-10-CM | POA: Diagnosis not present

## 2022-08-10 DIAGNOSIS — F331 Major depressive disorder, recurrent, moderate: Secondary | ICD-10-CM | POA: Diagnosis not present

## 2022-08-10 DIAGNOSIS — G629 Polyneuropathy, unspecified: Secondary | ICD-10-CM | POA: Diagnosis not present

## 2022-08-10 DIAGNOSIS — Z0001 Encounter for general adult medical examination with abnormal findings: Secondary | ICD-10-CM | POA: Diagnosis not present

## 2022-08-10 DIAGNOSIS — Z79899 Other long term (current) drug therapy: Secondary | ICD-10-CM | POA: Diagnosis not present

## 2022-08-10 DIAGNOSIS — A498 Other bacterial infections of unspecified site: Secondary | ICD-10-CM | POA: Diagnosis not present

## 2022-08-10 DIAGNOSIS — E1122 Type 2 diabetes mellitus with diabetic chronic kidney disease: Secondary | ICD-10-CM | POA: Diagnosis not present

## 2022-09-09 DIAGNOSIS — L309 Dermatitis, unspecified: Secondary | ICD-10-CM | POA: Diagnosis not present

## 2022-09-09 DIAGNOSIS — E11620 Type 2 diabetes mellitus with diabetic dermatitis: Secondary | ICD-10-CM | POA: Diagnosis not present

## 2022-09-09 DIAGNOSIS — Z789 Other specified health status: Secondary | ICD-10-CM | POA: Diagnosis not present

## 2022-09-09 DIAGNOSIS — E1142 Type 2 diabetes mellitus with diabetic polyneuropathy: Secondary | ICD-10-CM | POA: Diagnosis not present

## 2022-09-09 DIAGNOSIS — K529 Noninfective gastroenteritis and colitis, unspecified: Secondary | ICD-10-CM | POA: Diagnosis not present

## 2022-09-09 DIAGNOSIS — Z79899 Other long term (current) drug therapy: Secondary | ICD-10-CM | POA: Diagnosis not present

## 2022-09-13 DIAGNOSIS — K529 Noninfective gastroenteritis and colitis, unspecified: Secondary | ICD-10-CM | POA: Diagnosis not present

## 2022-09-23 DIAGNOSIS — R5381 Other malaise: Secondary | ICD-10-CM | POA: Diagnosis not present

## 2022-09-23 DIAGNOSIS — I69354 Hemiplegia and hemiparesis following cerebral infarction affecting left non-dominant side: Secondary | ICD-10-CM | POA: Diagnosis not present

## 2022-09-23 DIAGNOSIS — G629 Polyneuropathy, unspecified: Secondary | ICD-10-CM | POA: Diagnosis not present

## 2022-09-23 DIAGNOSIS — Z789 Other specified health status: Secondary | ICD-10-CM | POA: Diagnosis not present

## 2022-11-23 DIAGNOSIS — E1121 Type 2 diabetes mellitus with diabetic nephropathy: Secondary | ICD-10-CM | POA: Diagnosis not present

## 2022-11-23 DIAGNOSIS — N1831 Chronic kidney disease, stage 3a: Secondary | ICD-10-CM | POA: Diagnosis not present

## 2022-11-23 DIAGNOSIS — N184 Chronic kidney disease, stage 4 (severe): Secondary | ICD-10-CM | POA: Diagnosis not present

## 2022-11-30 DIAGNOSIS — I10 Essential (primary) hypertension: Secondary | ICD-10-CM | POA: Diagnosis not present

## 2022-11-30 DIAGNOSIS — R195 Other fecal abnormalities: Secondary | ICD-10-CM | POA: Diagnosis not present

## 2022-11-30 DIAGNOSIS — Z9181 History of falling: Secondary | ICD-10-CM | POA: Diagnosis not present

## 2022-11-30 DIAGNOSIS — I951 Orthostatic hypotension: Secondary | ICD-10-CM | POA: Diagnosis not present
